# Patient Record
Sex: Male | Born: 1975 | Race: White | Hispanic: No | Marital: Married | Smoking: Never smoker
Health system: Southern US, Community
[De-identification: ages and names within clinical notes are randomized; demographics above are authoritative.]

## PROBLEM LIST (undated history)

## (undated) ENCOUNTER — Emergency Department (HOSPITAL_COMMUNITY): Admission: EM | Source: Home / Self Care

## (undated) DIAGNOSIS — N529 Male erectile dysfunction, unspecified: Secondary | ICD-10-CM

## (undated) DIAGNOSIS — G4730 Sleep apnea, unspecified: Secondary | ICD-10-CM

## (undated) DIAGNOSIS — E8881 Metabolic syndrome: Secondary | ICD-10-CM

## (undated) DIAGNOSIS — H9190 Unspecified hearing loss, unspecified ear: Secondary | ICD-10-CM

## (undated) DIAGNOSIS — N189 Chronic kidney disease, unspecified: Secondary | ICD-10-CM

## (undated) DIAGNOSIS — E669 Obesity, unspecified: Secondary | ICD-10-CM

## (undated) DIAGNOSIS — N200 Calculus of kidney: Secondary | ICD-10-CM

## (undated) DIAGNOSIS — K219 Gastro-esophageal reflux disease without esophagitis: Secondary | ICD-10-CM

## (undated) DIAGNOSIS — R112 Nausea with vomiting, unspecified: Secondary | ICD-10-CM

## (undated) DIAGNOSIS — E66811 Obesity, class 1: Secondary | ICD-10-CM

## (undated) DIAGNOSIS — Z9889 Other specified postprocedural states: Secondary | ICD-10-CM

## (undated) DIAGNOSIS — Z87442 Personal history of urinary calculi: Secondary | ICD-10-CM

## (undated) DIAGNOSIS — E119 Type 2 diabetes mellitus without complications: Secondary | ICD-10-CM

## (undated) DIAGNOSIS — R091 Pleurisy: Secondary | ICD-10-CM

## (undated) DIAGNOSIS — J9811 Atelectasis: Secondary | ICD-10-CM

## (undated) DIAGNOSIS — I10 Essential (primary) hypertension: Secondary | ICD-10-CM

## (undated) DIAGNOSIS — T7840XA Allergy, unspecified, initial encounter: Secondary | ICD-10-CM

## (undated) HISTORY — DX: Gastro-esophageal reflux disease without esophagitis: K21.9

## (undated) HISTORY — DX: Essential (primary) hypertension: I10

## (undated) HISTORY — DX: Unspecified hearing loss, unspecified ear: H91.90

## (undated) HISTORY — DX: Obesity, class 1: E66.811

## (undated) HISTORY — DX: Atelectasis: J98.11

## (undated) HISTORY — DX: Pleurisy: R09.1

## (undated) HISTORY — DX: Allergy, unspecified, initial encounter: T78.40XA

## (undated) HISTORY — DX: Metabolic syndrome: E88.810

## (undated) HISTORY — PX: VASECTOMY: SHX75

## (undated) HISTORY — DX: Type 2 diabetes mellitus without complications: E11.9

## (undated) HISTORY — DX: Morbid (severe) obesity due to excess calories: E66.01

## (undated) HISTORY — DX: Obesity, unspecified: E66.9

## (undated) HISTORY — DX: Male erectile dysfunction, unspecified: N52.9

## (undated) HISTORY — DX: Sleep apnea, unspecified: G47.30

## (undated) HISTORY — DX: Metabolic syndrome: E88.81

## (undated) HISTORY — PX: CYSTOSCOPY WITH URETEROSCOPY AND STENT PLACEMENT: SHX6377

---
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
## 2005-07-22 ENCOUNTER — Ambulatory Visit: Payer: Self-pay | Admitting: Internal Medicine

## 2007-08-11 ENCOUNTER — Ambulatory Visit: Payer: Self-pay | Admitting: Urology

## 2012-12-29 ENCOUNTER — Ambulatory Visit: Payer: Self-pay | Admitting: Family Medicine

## 2013-01-02 ENCOUNTER — Ambulatory Visit: Payer: Self-pay | Admitting: Family Medicine

## 2013-02-07 ENCOUNTER — Ambulatory Visit: Payer: Self-pay | Admitting: Family Medicine

## 2013-02-19 DIAGNOSIS — R310 Gross hematuria: Secondary | ICD-10-CM | POA: Insufficient documentation

## 2013-02-19 DIAGNOSIS — R1011 Right upper quadrant pain: Secondary | ICD-10-CM | POA: Insufficient documentation

## 2013-02-19 DIAGNOSIS — N200 Calculus of kidney: Secondary | ICD-10-CM | POA: Insufficient documentation

## 2013-02-19 DIAGNOSIS — Z87442 Personal history of urinary calculi: Secondary | ICD-10-CM | POA: Insufficient documentation

## 2013-02-19 HISTORY — DX: Calculus of kidney: N20.0

## 2013-02-19 HISTORY — DX: Personal history of urinary calculi: Z87.442

## 2013-02-27 ENCOUNTER — Ambulatory Visit: Payer: Self-pay | Admitting: Family Medicine

## 2013-03-29 ENCOUNTER — Ambulatory Visit: Payer: Self-pay | Admitting: Family Medicine

## 2013-05-28 ENCOUNTER — Ambulatory Visit: Payer: Self-pay | Admitting: Family Medicine

## 2013-05-29 ENCOUNTER — Ambulatory Visit: Payer: Self-pay | Admitting: Family Medicine

## 2013-06-23 ENCOUNTER — Emergency Department: Payer: Self-pay | Admitting: Emergency Medicine

## 2013-08-01 ENCOUNTER — Ambulatory Visit: Payer: Self-pay | Admitting: Family Medicine

## 2013-08-22 ENCOUNTER — Ambulatory Visit: Payer: Self-pay | Admitting: Urology

## 2013-08-22 DIAGNOSIS — R6882 Decreased libido: Secondary | ICD-10-CM | POA: Insufficient documentation

## 2013-08-22 DIAGNOSIS — N529 Male erectile dysfunction, unspecified: Secondary | ICD-10-CM | POA: Insufficient documentation

## 2013-09-21 ENCOUNTER — Ambulatory Visit: Payer: Self-pay | Admitting: Otolaryngology

## 2015-05-07 ENCOUNTER — Other Ambulatory Visit: Payer: Self-pay | Admitting: Family Medicine

## 2015-05-29 ENCOUNTER — Encounter: Payer: Self-pay | Admitting: Family Medicine

## 2015-06-16 ENCOUNTER — Other Ambulatory Visit: Payer: Self-pay | Admitting: Family Medicine

## 2015-06-16 ENCOUNTER — Ambulatory Visit (INDEPENDENT_AMBULATORY_CARE_PROVIDER_SITE_OTHER): Payer: BLUE CROSS/BLUE SHIELD | Admitting: Family Medicine

## 2015-06-16 ENCOUNTER — Encounter: Payer: Self-pay | Admitting: Family Medicine

## 2015-06-16 VITALS — BP 118/76 | HR 86 | Temp 97.6°F | Resp 18 | Ht 68.0 in | Wt 238.2 lb

## 2015-06-16 DIAGNOSIS — G4733 Obstructive sleep apnea (adult) (pediatric): Secondary | ICD-10-CM | POA: Diagnosis not present

## 2015-06-16 DIAGNOSIS — R7309 Other abnormal glucose: Secondary | ICD-10-CM

## 2015-06-16 DIAGNOSIS — N528 Other male erectile dysfunction: Secondary | ICD-10-CM

## 2015-06-16 DIAGNOSIS — N529 Male erectile dysfunction, unspecified: Secondary | ICD-10-CM

## 2015-06-16 DIAGNOSIS — Z13220 Encounter for screening for lipoid disorders: Secondary | ICD-10-CM | POA: Diagnosis not present

## 2015-06-16 DIAGNOSIS — K219 Gastro-esophageal reflux disease without esophagitis: Secondary | ICD-10-CM

## 2015-06-16 DIAGNOSIS — E669 Obesity, unspecified: Secondary | ICD-10-CM | POA: Insufficient documentation

## 2015-06-16 DIAGNOSIS — IMO0001 Reserved for inherently not codable concepts without codable children: Secondary | ICD-10-CM

## 2015-06-16 DIAGNOSIS — Z0000 Encounter for general adult medical examination without abnormal findings: Secondary | ICD-10-CM | POA: Diagnosis not present

## 2015-06-16 DIAGNOSIS — M25611 Stiffness of right shoulder, not elsewhere classified: Secondary | ICD-10-CM | POA: Insufficient documentation

## 2015-06-16 DIAGNOSIS — R7303 Prediabetes: Secondary | ICD-10-CM

## 2015-06-16 DIAGNOSIS — I10 Essential (primary) hypertension: Secondary | ICD-10-CM

## 2015-06-16 DIAGNOSIS — Z9989 Dependence on other enabling machines and devices: Secondary | ICD-10-CM

## 2015-06-16 NOTE — Progress Notes (Signed)
Name: Wayne Flowers   MRN: [id number]    DOB: 09-02-1976   Date:06/16/2015       Progress Note  Subjective  Chief Complaint  Chief Complaint  Patient presents with  . Annual Exam    HPI  Patient is here today for a Complete Male Physical Exam:  The patient has no acute concerns but would like to switch his PCP to me. Overall feels healthy with continued issues regarding low libido, obesity, sleep apnea, occasional right shoulder pain. Chronic issues such as GERD and HTN are well controled. Diet is well balanced. In general does exercise regularly and has recently increased efforts for weight loss goals. Sees dentist regularly and addresses vision concerns with ophthalmologist if applicable. In regards to sexual activity the patient is currently sexually active with married partner alone. Currently is not concerned about exposure to any STDs. Denies any family history of prostate or colon cancer.    Past Medical History  Diagnosis Date  . Obesity, Class I, BMI 30.0-34.9 (see actual BMI)   . Metabolic syndrome   . Sleep apnea   . Hypertension   . Allergy   . Atelectasis   . GERD (gastroesophageal reflux disease)   . ED (erectile dysfunction)   . Pleurisy     Past Surgical History  Procedure Laterality Date  . Vasectomy      Family History  Problem Relation Age of Onset  . Diabetes Mother   . Cancer Father     renal  . Diabetes Sister     History   Social History  . Marital Status: Married    Spouse Name: N/A  . Number of Children: N/A  . Years of Education: N/A   Occupational History  . Not on file.   Social History Main Topics  . Smoking status: Never Smoker   . Smokeless tobacco: Not on file  . Alcohol Use: 0.0 oz/week    0 Standard drinks or equivalent per week     Comment: occasional  . Drug Use: No  . Sexual Activity:    Partners: Female   Other Topics Concern  . Not on file   Social History Narrative     Current outpatient prescriptions:   .  fluticasone (FLONASE) 50 MCG/ACT nasal spray, USE 1 SPRAY IN EACH NOSTRIL EVERY DAY, Disp: , Rfl: 99 .  lisinopril (PRINIVIL,ZESTRIL) 10 MG tablet, Take 10 mg by mouth daily., Disp: , Rfl: 5 .  sildenafil (REVATIO) 20 MG tablet, Take 1 tablet by mouth daily., Disp: , Rfl:   Allergies  Allergen Reactions  . Omeprazole Other (See Comments)    Patient reports memory loss while using this product.   Depression screen [facility] 2/9 06/16/2015  Decreased Interest 0  Down, Depressed, Hopeless 0  PHQ - 2 Score 0     ROS  CONSTITUTIONAL: No significant weight changes, fever, chills, weakness or fatigue.  HEENT:  - Eyes: No visual changes.  - Ears: No auditory changes. No pain.  - Nose: No sneezing, congestion, runny nose. - Throat: No sore throat. No changes in swallowing. SKIN: No rash or itching.  CARDIOVASCULAR: No chest pain, chest pressure or chest discomfort. No palpitations or edema.  RESPIRATORY: No shortness of breath, cough or sputum.  GASTROINTESTINAL: No anorexia, nausea, vomiting. No changes in bowel habits. No abdominal pain or blood.  GENITOURINARY: No dysuria. No frequency. No discharge.  NEUROLOGICAL: No headache, dizziness, syncope, paralysis, ataxia, numbness or tingling in the extremities. No memory changes. No change  in bowel or bladder control.  MUSCULOSKELETAL: No joint pain. No muscle pain. HEMATOLOGIC: No anemia, bleeding or bruising.  LYMPHATICS: No enlarged lymph nodes.  PSYCHIATRIC: No change in mood. No change in sleep pattern.  ENDOCRINOLOGIC: No reports of sweating, cold or heat intolerance. No polyuria or polydipsia.   Objective  Filed Vitals:   06/16/15 0836  BP: 118/76  Pulse: 86  Temp: 97.6 F (36.4 C)  TempSrc: Oral  Resp: 18  Height: 5\' 8"  (1.727 m)  Weight: 238 lb 3.2 oz (108.047 kg)  SpO2: 96%   Body mass index is 36.23 kg/(m^2).   Physical Exam  Constitutional: Patient is obese and well-nourished. In no distress.  HEENT:  - Head:  Normocephalic and atraumatic.  - Ears: Bilateral TMs gray, no erythema or effusion - Nose: Nasal mucosa moist - Mouth/Throat: Oropharynx is clear and moist. No tonsillar hypertrophy or erythema. No post nasal drainage.  - Eyes: Conjunctivae clear, EOM movements normal. PERRLA. No scleral icterus.  Neck: Normal range of motion. Neck supple. No JVD present. No thyromegaly present.  Cardiovascular: Normal rate, regular rhythm and normal heart sounds.  No murmur heard.  Pulmonary/Chest: Effort normal and breath sounds normal. No respiratory distress. Abdominal: Soft. Bowel sounds are normal, no distension. There is no tenderness. no masses BREAST: Bilateral breast exam normal with no masses, skin changes or nipple discharge MALE GENITALIA: Bilateral testes descended with no masses, no penile lesions, no penile discharge. PROSTATE: Did not examine RECTAL: Did not examine Musculoskeletal: Normal range of motion bilateral UE and LE, no joint effusions. Peripheral vascular: Bilateral LE no edema. Neurological: CN II-XII grossly intact with no focal deficits. Alert and oriented to person, place, and time. Coordination, balance, strength, speech and gait are normal.  Skin: Skin is warm and dry. No rash noted. No erythema.  Psychiatric: Patient has a normal mood and affect. Behavior is normal in office today. Judgment and thought content normal in office today.    Assessment & Plan  1. Annual physical exam I have accepted [name] as my patient. We reviewed his medical history and current medical diagnoses and ongoing management.  2. Obesity, Class II, BMI 35-39.9, with comorbidity The patient has been counseled on their higher than normal BMI.  They have verbally expressed understanding their increased risk for other diseases.  In efforts to meet a better target BMI goal the patient has been counseled on lifestyle, diet and exercise modification tactics. Start with moderate intensity  aerobic exercise (walking, jogging, elliptical, swimming, group or individual sports, hiking) at least 39mins a day at least 4 days a week and increase intensity, duration, frequency as tolerated. Diet should include well balance fresh fruits and vegetables avoiding processed foods, carbohydrates and sugars. Drink at least 8oz 10 glasses a day avoiding sodas, sugary fruit drinks, sweetened tea. Check weight on a reliable scale daily and monitor weight loss progress daily. Consider investing in mobile phone apps that will help keep track of weight loss goals.   3. Pre-diabetes Lab work.  - Hemoglobin A1c  4. Obstructive sleep apnea treated with continuous positive airway pressure (CPAP) Continue with weight loss efforts.  - TSH  5. Hypertension goal BP (blood pressure) < 140/90 Well controled.  - CBC with Differential/Platelet - Comprehensive metabolic panel - TSH - Hemoglobin A1c  6. ED (erectile dysfunction) of organic origin May continue use of Sildenafil.  - TSH  7. Screening cholesterol level Lab work.  - Lipid panel  8. GERD without esophagitis  Continue lifestyle changes and Ranitidine prn.

## 2015-06-16 NOTE — Patient Instructions (Signed)
Fat and Cholesterol Control Diet Fat and cholesterol levels in your blood and organs are influenced by your diet. High levels of fat and cholesterol may lead to diseases of the heart, small and large blood vessels, gallbladder, liver, and pancreas. CONTROLLING FAT AND CHOLESTEROL WITH DIET Although exercise and lifestyle factors are important, your diet is key. That is because certain foods are known to raise cholesterol and others to lower it. The goal is to balance foods for their effect on cholesterol and more importantly, to replace saturated and trans fat with other types of fat, such as monounsaturated fat, polyunsaturated fat, and omega-3 fatty acids. On average, a person should consume no more than 15 to 17 g of saturated fat daily. Saturated and trans fats are considered "bad" fats, and they will raise LDL cholesterol. Saturated fats are primarily found in animal products such as meats, butter, and cream. However, that does not mean you need to give up all your favorite foods. Today, there are good tasting, low-fat, low-cholesterol substitutes for most of the things you like to eat. Choose low-fat or nonfat alternatives. Choose round or loin cuts of red meat. These types of cuts are lowest in fat and cholesterol. Chicken (without the skin), fish, veal, and ground turkey breast are great choices. Eliminate fatty meats, such as hot dogs and salami. Even shellfish have little or no saturated fat. Have a 3 oz (85 g) portion when you eat lean meat, poultry, or fish. Trans fats are also called "partially hydrogenated oils." They are oils that have been scientifically manipulated so that they are solid at room temperature resulting in a longer shelf life and improved taste and texture of foods in which they are added. Trans fats are found in stick margarine, some tub margarines, cookies, crackers, and baked goods.  When baking and cooking, oils are a great substitute for butter. The monounsaturated oils are  especially beneficial since it is believed they lower LDL and raise HDL. The oils you should avoid entirely are saturated tropical oils, such as coconut and palm.  Remember to eat a lot from food groups that are naturally free of saturated and trans fat, including fish, fruit, vegetables, beans, grains (barley, rice, couscous, bulgur wheat), and pasta (without cream sauces).  IDENTIFYING FOODS THAT LOWER FAT AND CHOLESTEROL  Soluble fiber may lower your cholesterol. This type of fiber is found in fruits such as apples, vegetables such as broccoli, potatoes, and carrots, legumes such as beans, peas, and lentils, and grains such as barley. Foods fortified with plant sterols (phytosterol) may also lower cholesterol. You should eat at least 2 g per day of these foods for a cholesterol lowering effect.  Read package labels to identify low-saturated fats, trans fat free, and low-fat foods at the supermarket. Select cheeses that have only 2 to 3 g saturated fat per ounce. Use a heart-healthy tub margarine that is free of trans fats or partially hydrogenated oil. When buying baked goods (cookies, crackers), avoid partially hydrogenated oils. Breads and muffins should be made from whole grains (whole-wheat or whole oat flour, instead of "flour" or "enriched flour"). Buy non-creamy canned soups with reduced salt and no added fats.  FOOD PREPARATION TECHNIQUES  Never deep-fry. If you must fry, either stir-fry, which uses very little fat, or use non-stick cooking sprays. When possible, broil, bake, or roast meats, and steam vegetables. Instead of putting butter or margarine on vegetables, use lemon and herbs, applesauce, and cinnamon (for squash and sweet potatoes). Use nonfat   yogurt, salsa, and low-fat dressings for salads.  LOW-SATURATED FAT / LOW-FAT FOOD SUBSTITUTES Meats / Saturated Fat (g)  Avoid: Steak, marbled (3 oz/85 g) / 11 g  Choose: Steak, lean (3 oz/85 g) / 4 g  Avoid: Hamburger (3 oz/85 g) / 7  g  Choose: Hamburger, lean (3 oz/85 g) / 5 g  Avoid: Ham (3 oz/85 g) / 6 g  Choose: Ham, lean cut (3 oz/85 g) / 2.4 g  Avoid: Chicken, with skin, dark meat (3 oz/85 g) / 4 g  Choose: Chicken, skin removed, dark meat (3 oz/85 g) / 2 g  Avoid: Chicken, with skin, light meat (3 oz/85 g) / 2.5 g  Choose: Chicken, skin removed, light meat (3 oz/85 g) / 1 g Dairy / Saturated Fat (g)  Avoid: Whole milk (1 cup) / 5 g  Choose: Low-fat milk, 2% (1 cup) / 3 g  Choose: Low-fat milk, 1% (1 cup) / 1.5 g  Choose: Skim milk (1 cup) / 0.3 g  Avoid: Hard cheese (1 oz/28 g) / 6 g  Choose: Skim milk cheese (1 oz/28 g) / 2 to 3 g  Avoid: Cottage cheese, 4% fat (1 cup) / 6.5 g  Choose: Low-fat cottage cheese, 1% fat (1 cup) / 1.5 g  Avoid: Ice cream (1 cup) / 9 g  Choose: Sherbet (1 cup) / 2.5 g  Choose: Nonfat frozen yogurt (1 cup) / 0.3 g  Choose: Frozen fruit bar / trace  Avoid: Whipped cream (1 tbs) / 3.5 g  Choose: Nondairy whipped topping (1 tbs) / 1 g Condiments / Saturated Fat (g)  Avoid: Mayonnaise (1 tbs) / 2 g  Choose: Low-fat mayonnaise (1 tbs) / 1 g  Avoid: Butter (1 tbs) / 7 g  Choose: Extra light margarine (1 tbs) / 1 g  Avoid: Coconut oil (1 tbs) / 11.8 g  Choose: Olive oil (1 tbs) / 1.8 g  Choose: Corn oil (1 tbs) / 1.7 g  Choose: Safflower oil (1 tbs) / 1.2 g  Choose: Sunflower oil (1 tbs) / 1.4 g  Choose: Soybean oil (1 tbs) / 2.4 g  Choose: Canola oil (1 tbs) / 1 g Document Released: 11/15/2005 Document Revised: 03/12/2013 Document Reviewed: 02/13/2014 Exit[employer] Patient Information 2015 [employer]. This information is not intended to replace advice given to you by your health care provider. Make sure you discuss any questions you have with your health care provider.  

## 2015-06-17 LAB — COMPREHENSIVE METABOLIC PANEL
ALT: 35 IU/L (ref 0–44)
AST: 21 IU/L (ref 0–40)
Albumin/Globulin Ratio: 2 (ref 1.1–2.5)
Albumin: 4.5 g/dL (ref 3.5–5.5)
Alkaline Phosphatase: 59 IU/L (ref 39–117)
BUN/Creatinine Ratio: 18 (ref 9–20)
BUN: 17 mg/dL (ref 6–24)
Bilirubin Total: 0.3 mg/dL (ref 0.0–1.2)
CO2: 25 mmol/L (ref 18–29)
Calcium: 9.4 mg/dL (ref 8.7–10.2)
Chloride: 100 mmol/L (ref 97–108)
Creatinine, Ser: 0.97 mg/dL (ref 0.76–1.27)
GFR calc Af Amer: 112 mL/min/{1.73_m2} (ref >59)
GFR calc non Af Amer: 97 mL/min/{1.73_m2} (ref >59)
Globulin, Total: 2.3 g/dL (ref 1.5–4.5)
Glucose: 115 mg/dL — ABNORMAL HIGH (ref 65–99)
Potassium: 5 mmol/L (ref 3.5–5.2)
Sodium: 139 mmol/L (ref 134–144)
Total Protein: 6.8 g/dL (ref 6.0–8.5)

## 2015-06-17 LAB — CBC WITH DIFFERENTIAL/PLATELET
Basophils Absolute: 0.1 10*3/uL (ref 0.0–0.2)
Basos: 1 %
EOS (ABSOLUTE): 0.2 10*3/uL (ref 0.0–0.4)
Eos: 2 %
Hematocrit: 42.8 % (ref 37.5–51.0)
Hemoglobin: 14.7 g/dL (ref 12.6–17.7)
Immature Grans (Abs): 0 10*3/uL (ref 0.0–0.1)
Immature Granulocytes: 0 %
Lymphocytes Absolute: 2.5 10*3/uL (ref 0.7–3.1)
Lymphs: 24 %
MCH: 29.8 pg (ref 26.6–33.0)
MCHC: 34.3 g/dL (ref 31.5–35.7)
MCV: 87 fL (ref 79–97)
Monocytes Absolute: 0.6 10*3/uL (ref 0.1–0.9)
Monocytes: 6 %
Neutrophils Absolute: 6.8 10*3/uL (ref 1.4–7.0)
Neutrophils: 67 %
Platelets: 296 10*3/uL (ref 150–379)
RBC: 4.94 x10E6/uL (ref 4.14–5.80)
RDW: 13.6 % (ref 12.3–15.4)
WBC: 10.3 10*3/uL (ref 3.4–10.8)

## 2015-06-17 LAB — HEMOGLOBIN A1C
Est. average glucose Bld gHb Est-mCnc: 134 mg/dL
Hgb A1c MFr Bld: 6.3 % — ABNORMAL HIGH (ref 4.8–5.6)

## 2015-06-17 LAB — LIPID PANEL
Chol/HDL Ratio: 4.3 ratio units (ref 0.0–5.0)
Cholesterol, Total: 200 mg/dL — ABNORMAL HIGH (ref 100–199)
HDL: 47 mg/dL (ref >39)
LDL Calculated: 121 mg/dL — ABNORMAL HIGH (ref 0–99)
Triglycerides: 162 mg/dL — ABNORMAL HIGH (ref 0–149)
VLDL Cholesterol Cal: 32 mg/dL (ref 5–40)

## 2015-06-17 LAB — TSH: TSH: 2.5 u[IU]/mL (ref 0.450–4.500)

## 2015-06-18 ENCOUNTER — Telehealth: Payer: Self-pay

## 2015-06-18 NOTE — Telephone Encounter (Signed)
Patient returned my call from earlier regarding his lab results. Patient was encouraged to modified his diet: decrease the amounts of fried fatty foods and incorporate more vegetables and whole grains along with 20mins of regular excericse. Patient was informed that she wanted to see him back in 6 months for a re-check and to do additional labs. Patient said ok.

## 2015-07-25 ENCOUNTER — Other Ambulatory Visit: Payer: Self-pay | Admitting: Emergency Medicine

## 2015-07-25 MED ORDER — LISINOPRIL 10 MG PO TABS: 10.0000 mg | ORAL_TABLET | Freq: Every day | ORAL | Status: DC

## 2015-08-25 ENCOUNTER — Telehealth: Payer: Self-pay | Admitting: Family Medicine

## 2015-08-25 NOTE — Telephone Encounter (Signed)
Called [facility] and spoke with Jerene Pitch to see how I can be of help to her. She stated that this patient was transferring his medication to their pharmacy and needing clarification on the dosage for his Viagra.

## 2015-08-25 NOTE — Telephone Encounter (Signed)
Wayne Flowers is reqeusting return call [phone]. They are needing clarification on Sildenafil

## 2015-09-01 ENCOUNTER — Emergency Department
Admission: EM | Admit: 2015-09-01 | Discharge: 2015-09-01 | Disposition: A | Discharge status: Home / Self Care | Payer: BLUE CROSS/BLUE SHIELD | Attending: Emergency Medicine | Admitting: Emergency Medicine

## 2015-09-01 ENCOUNTER — Encounter: Payer: Self-pay | Admitting: Emergency Medicine

## 2015-09-01 DIAGNOSIS — R109 Unspecified abdominal pain: Secondary | ICD-10-CM | POA: Diagnosis present

## 2015-09-01 DIAGNOSIS — I10 Essential (primary) hypertension: Secondary | ICD-10-CM | POA: Insufficient documentation

## 2015-09-01 DIAGNOSIS — Z79899 Other long term (current) drug therapy: Secondary | ICD-10-CM | POA: Diagnosis not present

## 2015-09-01 DIAGNOSIS — Z7951 Long term (current) use of inhaled steroids: Secondary | ICD-10-CM | POA: Insufficient documentation

## 2015-09-01 DIAGNOSIS — N23 Unspecified renal colic: Secondary | ICD-10-CM

## 2015-09-01 LAB — CBC WITH DIFFERENTIAL/PLATELET
Basophils Absolute: 0.2 10*3/uL — ABNORMAL HIGH (ref 0–0.1)
Basophils Relative: 1 %
Eosinophils Absolute: 0.2 10*3/uL (ref 0–0.7)
Eosinophils Relative: 1 %
HCT: 41.2 % (ref 40.0–52.0)
Hemoglobin: 13.7 g/dL (ref 13.0–18.0)
Lymphocytes Relative: 14 %
Lymphs Abs: 2.2 10*3/uL (ref 1.0–3.6)
MCH: 29.2 pg (ref 26.0–34.0)
MCHC: 33.3 g/dL (ref 32.0–36.0)
MCV: 87.7 fL (ref 80.0–100.0)
Monocytes Absolute: 1.1 10*3/uL — ABNORMAL HIGH (ref 0.2–1.0)
Monocytes Relative: 7 %
Neutro Abs: 12.2 10*3/uL — ABNORMAL HIGH (ref 1.4–6.5)
Neutrophils Relative %: 77 %
Platelets: 304 10*3/uL (ref 150–440)
RBC: 4.7 MIL/uL (ref 4.40–5.90)
RDW: 12.8 % (ref 11.5–14.5)
WBC: 15.8 10*3/uL — ABNORMAL HIGH (ref 3.8–10.6)

## 2015-09-01 LAB — URINALYSIS COMPLETE WITH MICROSCOPIC (ARMC ONLY)
Bacteria, UA: NONE SEEN
Bilirubin Urine: NEGATIVE
Glucose, UA: NEGATIVE mg/dL
Ketones, ur: NEGATIVE mg/dL
Leukocytes, UA: NEGATIVE
Nitrite: NEGATIVE
Protein, ur: 30 mg/dL — AB
Specific Gravity, Urine: 1.023 (ref 1.005–1.030)
Squamous Epithelial / LPF: NONE SEEN
pH: 5 (ref 5.0–8.0)

## 2015-09-01 LAB — BASIC METABOLIC PANEL
Anion gap: 10 (ref 5–15)
BUN: 17 mg/dL (ref 6–20)
CO2: 27 mmol/L (ref 22–32)
Calcium: 8.8 mg/dL — ABNORMAL LOW (ref 8.9–10.3)
Chloride: 99 mmol/L — ABNORMAL LOW (ref 101–111)
Creatinine, Ser: 1.28 mg/dL — ABNORMAL HIGH (ref 0.61–1.24)
GFR calc Af Amer: >60 mL/min (ref >60)
GFR calc non Af Amer: >60 mL/min (ref >60)
Glucose, Bld: 117 mg/dL — ABNORMAL HIGH (ref 65–99)
Potassium: 4.1 mmol/L (ref 3.5–5.1)
Sodium: 136 mmol/L (ref 135–145)

## 2015-09-01 MED ORDER — FENTANYL CITRATE (PF) 100 MCG/2ML IJ SOLN: 50.0000 ug | Freq: Once | INTRAMUSCULAR | Status: DC

## 2015-09-01 MED ORDER — FENTANYL CITRATE (PF) 100 MCG/2ML IJ SOLN
100.0000 ug | Freq: Once | INTRAMUSCULAR | Status: AC
  Administered 2015-09-01: 100 ug via INTRAVENOUS
  Filled 2015-09-01: qty 2

## 2015-09-01 MED ORDER — KETOROLAC TROMETHAMINE 30 MG/ML IJ SOLN
30.0000 mg | Freq: Once | INTRAMUSCULAR | Status: AC
  Administered 2015-09-01: 30 mg via INTRAVENOUS
  Filled 2015-09-01: qty 1

## 2015-09-01 MED ORDER — OXYCODONE-ACETAMINOPHEN 5-325 MG PO TABS: 1.0000 | ORAL_TABLET | ORAL | Status: DC | PRN

## 2015-09-01 MED ORDER — ONDANSETRON HCL 4 MG/2ML IJ SOLN
4.0000 mg | Freq: Once | INTRAMUSCULAR | Status: AC
  Administered 2015-09-01: 4 mg via INTRAVENOUS
  Filled 2015-09-01: qty 2

## 2015-09-01 NOTE — ED Notes (Signed)
SaO2 down to 86-91% RA post pain med.  Pt placed on O2 2L [place]. W/resulting SaO2 96%

## 2015-09-01 NOTE — Discharge Instructions (Signed)

## 2015-09-01 NOTE — ED Provider Notes (Signed)
[facility] Emergency Department Provider Note  ____________________________________________  Time seen: 417 pm  I have reviewed the triage vital signs and the nursing notes.   HISTORY  Chief Complaint Flank Pain     HPI Wayne Flowers is a 39 y.o. male  who presents with flank pain on the left. This is been present for approximately a week but worsened over the past day area it became particularly bad this afternoon and has been ongoing since approximately 3 PM. She reports this is similar to prior renal stones. His last renal stone was approximately one year ago. He is competent of this diagnosis.  He denies any fever. He denies any diarrhea or abdominal pain.    Past Medical History  Diagnosis Date  . Obesity, Class I, BMI 30.0-34.9 (see actual BMI)   . Metabolic syndrome   . Sleep apnea   . Hypertension   . Allergy   . Atelectasis   . GERD (gastroesophageal reflux disease)   . ED (erectile dysfunction)   . Pleurisy     Patient Active Problem List   Diagnosis Date Noted  . Obesity, Class II, BMI 35-39.9, with comorbidity ([place]) 06/16/2015  . Pre-diabetes 06/16/2015  . Obstructive sleep apnea treated with continuous positive airway pressure (CPAP) 06/16/2015  . Hypertension goal BP (blood pressure) < 140/90 06/16/2015  . Screening cholesterol level 06/16/2015  . Annual physical exam 06/16/2015  . GERD without esophagitis 06/16/2015  . Stiffness of right shoulder joint 06/16/2015  . 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 History of renal calculi 02/19/2013    Past Surgical History  Procedure Laterality Date  . Vasectomy      Current Outpatient Rx  Name  Route  Sig  Dispense  Refill  . fluticasone (FLONASE) 50 MCG/ACT nasal spray   Each Nare   Place 1 spray into both nostrils daily.         Marland Kitchen lisinopril (PRINIVIL,ZESTRIL) 10 MG tablet   Oral   Take 1 tablet (10 mg total) by mouth daily.   90 tablet   1   .  ranitidine (ZANTAC) 150 MG tablet   Oral   Take 150 mg by mouth 2 (two) times daily as needed for heartburn.         . sildenafil (REVATIO) 20 MG tablet   Oral   Take 60-100 mg by mouth daily as needed (for ED).          Marland Kitchen oxyCODONE-acetaminophen (ROXICET) 5-325 MG tablet   Oral   Take 1 tablet by mouth every 4 (four) hours as needed for severe pain.   15 tablet   0     Allergies Omeprazole  Family History  Problem Relation Age of Onset  . Diabetes Mother   . Cancer Father     renal  . Diabetes Sister     Social History Social History  Substance Use Topics  . Smoking status: Never Smoker   . Smokeless tobacco: None  . Alcohol Use: 0.0 oz/week    0 Standard drinks or equivalent per week     Comment: occasional    Review of Systems  Constitutional: Negative for fever. ENT: Negative for sore throat. Cardiovascular: Negative for chest pain. Respiratory: Negative for shortness of breath. Gastrointestinal: Negative for abdominal pain, vomiting and diarrhea. Genitourinary: Positive for left flank pain. Musculoskeletal: No myalgias or injuries. Skin: Negative for rash. Neurological: Negative for headaches   10-point ROS otherwise negative.  ____________________________________________   PHYSICAL  EXAM:  VITAL SIGNS: ED Triage Vitals  Enc Vitals Group     BP 09/01/15 2028 132/82 mmHg     Pulse Rate 09/01/15 2028 62     Resp 09/01/15 2028 16     Temp 09/01/15 2028 98.7 F (37.1 C)     Temp Source 09/01/15 2028 Oral     SpO2 09/01/15 2028 98 %     Weight --      Height --      Head Cir --      Peak Flow --      Pain Score 09/01/15 1959 10     Pain Loc --      Pain Edu? --      Excl. in [place]? --     Constitutional: Alert and oriented. The patient arrived to the emergency Department in significant distress, but now, after treatment with pain medication, he appears well and in no distress. ENT   Head: Normocephalic and atraumatic.   Nose: No  congestion/rhinnorhea. Cardiovascular: Normal rate, regular rhythm, no murmur noted Respiratory:  Normal respiratory effort, no tachypnea.    Breath sounds are clear and equal bilaterally.  Gastrointestinal: Soft and nontender. No distention.  Back: No muscle spasm, no tenderness, no CVA tenderness. Musculoskeletal: No deformity noted. Nontender with normal range of motion in all extremities.  No noted edema. Neurologic:  Normal speech and language. No gross focal neurologic deficits are appreciated.  Skin:  Skin is warm, dry. No rash noted. Psychiatric: Mood and affect are normal. Speech and behavior are normal.  ____________________________________________    LABS (pertinent positives/negatives)  Labs Reviewed  BASIC METABOLIC PANEL - Abnormal; Notable for the following:    Chloride 99 (*)    Glucose, Bld 117 (*)    Creatinine, Ser 1.28 (*)    Calcium 8.8 (*)    All other components within normal limits  URINALYSIS COMPLETEWITH MICROSCOPIC ([facility] ONLY) - Abnormal; Notable for the following:    Color, Urine YELLOW (*)    APPearance CLEAR (*)    Hgb urine dipstick 3+ (*)    Protein, ur 30 (*)    All other components within normal limits  CBC WITH DIFFERENTIAL/PLATELET - Abnormal; Notable for the following:    WBC 15.8 (*)    Neutro Abs 12.2 (*)    Monocytes Absolute 1.1 (*)    Basophils Absolute 0.2 (*)    All other components within normal limits   ____________________________________________   INITIAL IMPRESSION / ASSESSMENT AND PLAN / ED COURSE  Pertinent labs & imaging results that were available during my care of the patient were reviewed by me and considered in my medical decision making (see chart for details).  The patient was treated with fentanyl and Toradol. He reported fairly immediate relief with these medications. On my exam, the patient appears comfortable. The nurses have reported that he had a desaturation following treatment with this medication. On my  examination, I was able to turn the oxygen off. The patient maintained an oxygen saturation level of 98% on room air. He did not have any somnolence or apparent impairment from the medication he had received.  I discussed imaging options with this patient, including CT or ultrasound. The patient declines any imaging saying he is confident in the diagnosis. I am competent that he is right as well. We have a urinalysis pending to ensure that he does not have an infection. Meanwhile, he is comfortable. If the pain returns we will continue to use appropriate  pain medications.  ----------------------------------------- 10:11 PM on 09/01/2015 -----------------------------------------  Urine shows red blood cells too numerous to count. He does not have sign of infection in the urine. He does have white blood cell count of 15.8. His renal function looks good. At this time the patient is comfortable. We will discharge him home with a prescription for Percocet if needed and instructions to follow-up with urology.  ____________________________________________   FINAL CLINICAL IMPRESSION(S) / ED DIAGNOSES  Final diagnoses:  Renal colic on left side      Ahmed Prima, MD 09/01/15 2214

## 2015-09-01 NOTE — ED Notes (Signed)
Pt presents to ED with c/o left sided flank pain x4 days. Pt reports Hx Kidney stone.

## 2015-09-15 ENCOUNTER — Encounter: Payer: Self-pay | Admitting: *Deleted

## 2015-09-15 ENCOUNTER — Other Ambulatory Visit: Payer: BLUE CROSS/BLUE SHIELD

## 2015-09-15 NOTE — Patient Instructions (Signed)
  Your procedure is scheduled on: 09-17-15 Report to [facility] To find out your arrival time please call [phone] between 1PM - 3PM on 09-16-15  Remember: Instructions that are not followed completely may result in serious medical risk, up to and including death, or upon the discretion of your surgeon and anesthesiologist your surgery may need to be rescheduled.    _X___ 1. Do not eat food or drink liquids after midnight. No gum chewing or hard candies.     _X___ 2. No Alcohol for 24 hours before or after surgery.   ____ 3. Bring all medications with you on the day of surgery if instructed.    ____ 4. Notify your doctor if there is any change in your medical condition     (cold, fever, infections).     Do not wear jewelry, make-up, hairpins, clips or nail polish.  Do not wear lotions, powders, or perfumes. You may wear deodorant.  Do not shave 48 hours prior to surgery. Men may shave face and neck.  Do not bring valuables to the hospital.    [facility] is not responsible for any belongings or valuables.               Contacts, dentures or bridgework may not be worn into surgery.  Leave your suitcase in the car. After surgery it may be brought to your room.  For patients admitted to the hospital, discharge time is determined by your  treatment team.   Patients discharged the day of surgery will not be allowed to drive home.   Please read over the following fact sheets that you were given:     _X___ Take these medicines the morning of surgery with A SIP OF WATER:    1. LISINOPRIL  2. ZANTAC  3. TAKE AN EXTRA ZANTAC TONIGHT   4.  5.  6.  ____ Fleet Enema (as directed)   ____ Use CHG Soap as directed  ____ Use inhalers on the day of surgery  ____ Stop metformin 2 days prior to surgery    ____ Take 1/2 of usual insulin dose the night before surgery and none on the morning of surgery.   ____ Stop Coumadin/Plavix/aspirin-N/A  ____ Stop  Anti-inflammatories-NO NSAIDS OR ASA PRODUCTS-NUCYNTA OK TO CONTINUE   ____ Stop supplements until after surgery.    ____ Bring C-Pap to the hospital.

## 2015-09-16 ENCOUNTER — Encounter
Admission: RE | Disposition: A | Discharge status: Home / Self Care | Payer: Self-pay | Source: Ambulatory Visit | Attending: Urology

## 2015-09-16 ENCOUNTER — Ambulatory Visit: Payer: BLUE CROSS/BLUE SHIELD | Admitting: Anesthesiology

## 2015-09-16 ENCOUNTER — Encounter: Payer: Self-pay | Admitting: *Deleted

## 2015-09-16 ENCOUNTER — Ambulatory Visit
Admission: RE | Admit: 2015-09-16 | Discharge: 2015-09-16 | Disposition: A | Discharge status: Home / Self Care | Payer: BLUE CROSS/BLUE SHIELD | Source: Ambulatory Visit | Attending: Urology | Admitting: Urology

## 2015-09-16 DIAGNOSIS — I10 Essential (primary) hypertension: Secondary | ICD-10-CM | POA: Insufficient documentation

## 2015-09-16 DIAGNOSIS — J309 Allergic rhinitis, unspecified: Secondary | ICD-10-CM | POA: Insufficient documentation

## 2015-09-16 DIAGNOSIS — Z841 Family history of disorders of kidney and ureter: Secondary | ICD-10-CM | POA: Insufficient documentation

## 2015-09-16 DIAGNOSIS — N201 Calculus of ureter: Secondary | ICD-10-CM | POA: Diagnosis present

## 2015-09-16 DIAGNOSIS — N132 Hydronephrosis with renal and ureteral calculous obstruction: Secondary | ICD-10-CM | POA: Diagnosis not present

## 2015-09-16 DIAGNOSIS — R000 Tachycardia, unspecified: Secondary | ICD-10-CM | POA: Diagnosis not present

## 2015-09-16 DIAGNOSIS — Z8051 Family history of malignant neoplasm of kidney: Secondary | ICD-10-CM | POA: Insufficient documentation

## 2015-09-16 DIAGNOSIS — Z79899 Other long term (current) drug therapy: Secondary | ICD-10-CM | POA: Insufficient documentation

## 2015-09-16 DIAGNOSIS — K219 Gastro-esophageal reflux disease without esophagitis: Secondary | ICD-10-CM | POA: Diagnosis not present

## 2015-09-16 HISTORY — DX: Chronic kidney disease, unspecified: N18.9

## 2015-09-16 HISTORY — DX: Other specified postprocedural states: R11.2

## 2015-09-16 HISTORY — PX: CYSTOSCOPY W/ RETROGRADES: SHX1426

## 2015-09-16 HISTORY — PX: URETEROSCOPY WITH HOLMIUM LASER LITHOTRIPSY: SHX6645

## 2015-09-16 HISTORY — DX: Nausea with vomiting, unspecified: Z98.890

## 2015-09-16 LAB — GLUCOSE, CAPILLARY: Glucose-Capillary: 99 mg/dL (ref 65–99)

## 2015-09-16 SURGERY — URETEROSCOPY, WITH LITHOTRIPSY USING HOLMIUM LASER
Anesthesia: General | Laterality: Left | Wound class: Clean Contaminated

## 2015-09-16 MED ORDER — LIDOCAINE HCL 2 % EX GEL
CUTANEOUS | Status: AC
Start: 2015-09-16 — End: 2015-09-16
  Filled 2015-09-16: qty 10

## 2015-09-16 MED ORDER — LACTATED RINGERS IV SOLN
INTRAVENOUS | Status: DC
  Administered 2015-09-16 (×2): via INTRAVENOUS

## 2015-09-16 MED ORDER — FENTANYL CITRATE (PF) 100 MCG/2ML IJ SOLN
25.0000 ug | INTRAMUSCULAR | Status: DC | PRN
  Administered 2015-09-16 (×2): 25 ug via INTRAVENOUS

## 2015-09-16 MED ORDER — KETOROLAC TROMETHAMINE 60 MG/2ML IM SOLN
INTRAMUSCULAR | Status: AC
  Filled 2015-09-16: qty 2

## 2015-09-16 MED ORDER — NUCYNTA 50 MG PO TABS: 50.0000 mg | ORAL_TABLET | Freq: Four times a day (QID) | ORAL | Status: DC | PRN

## 2015-09-16 MED ORDER — LEVOFLOXACIN IN D5W 500 MG/100ML IV SOLN
INTRAVENOUS | Status: AC
  Administered 2015-09-16: 500 mg via INTRAVENOUS
  Filled 2015-09-16: qty 100

## 2015-09-16 MED ORDER — TAMSULOSIN HCL 0.4 MG PO CAPS: 0.4000 mg | ORAL_CAPSULE | Freq: Every day | ORAL | Status: DC

## 2015-09-16 MED ORDER — ONDANSETRON HCL 4 MG/2ML IJ SOLN: 4.0000 mg | Freq: Once | INTRAMUSCULAR | Status: DC | PRN

## 2015-09-16 MED ORDER — LEVOFLOXACIN 500 MG PO TABS: 500.0000 mg | ORAL_TABLET | Freq: Every day | ORAL | Status: DC

## 2015-09-16 MED ORDER — SODIUM CHLORIDE 0.9 % IR SOLN
Status: DC | PRN
  Administered 2015-09-16: 600 mL

## 2015-09-16 MED ORDER — DOCUSATE SODIUM 100 MG PO CAPS: 200.0000 mg | ORAL_CAPSULE | Freq: Two times a day (BID) | ORAL | Status: DC

## 2015-09-16 MED ORDER — BELLADONNA ALKALOIDS-OPIUM 16.2-60 MG RE SUPP
RECTAL | Status: DC | PRN
  Administered 2015-09-16: 1 via RECTAL

## 2015-09-16 MED ORDER — LIDOCAINE HCL (CARDIAC) 20 MG/ML IV SOLN
INTRAVENOUS | Status: DC | PRN
  Administered 2015-09-16: 60 mg via INTRAVENOUS

## 2015-09-16 MED ORDER — LIDOCAINE HCL 2 % EX GEL
CUTANEOUS | Status: DC | PRN
  Administered 2015-09-16: 1

## 2015-09-16 MED ORDER — MIDAZOLAM HCL 5 MG/5ML IJ SOLN
INTRAMUSCULAR | Status: DC | PRN
Start: 2015-09-16 — End: 2015-09-16
  Administered 2015-09-16: 2 mg via INTRAVENOUS

## 2015-09-16 MED ORDER — BELLADONNA ALKALOIDS-OPIUM 16.2-60 MG RE SUPP
RECTAL | Status: AC
  Filled 2015-09-16: qty 1

## 2015-09-16 MED ORDER — DEXAMETHASONE SODIUM PHOSPHATE 10 MG/ML IJ SOLN
INTRAMUSCULAR | Status: DC | PRN
  Administered 2015-09-16: 5 mg via INTRAVENOUS

## 2015-09-16 MED ORDER — LEVOFLOXACIN IN D5W 500 MG/100ML IV SOLN
500.0000 mg | Freq: Once | INTRAVENOUS | Status: AC
  Administered 2015-09-16: 500 mg via INTRAVENOUS

## 2015-09-16 MED ORDER — FENTANYL CITRATE (PF) 100 MCG/2ML IJ SOLN
INTRAMUSCULAR | Status: AC
  Administered 2015-09-16: 25 ug via INTRAVENOUS
  Filled 2015-09-16: qty 2

## 2015-09-16 MED ORDER — IOTHALAMATE MEGLUMINE 43 % IV SOLN
INTRAVENOUS | Status: DC | PRN
Start: 2015-09-16 — End: 2015-09-16
  Administered 2015-09-16: 5 mL

## 2015-09-16 MED ORDER — ONDANSETRON 8 MG PO TBDP: 8.0000 mg | ORAL_TABLET | Freq: Four times a day (QID) | ORAL | Status: DC | PRN

## 2015-09-16 MED ORDER — FAMOTIDINE 20 MG PO TABS
ORAL_TABLET | ORAL | Status: AC
  Administered 2015-09-16: 20 mg via ORAL
  Filled 2015-09-16: qty 1

## 2015-09-16 MED ORDER — PROPOFOL 10 MG/ML IV BOLUS
INTRAVENOUS | Status: DC | PRN
  Administered 2015-09-16: 200 mg via INTRAVENOUS

## 2015-09-16 MED ORDER — KETOROLAC TROMETHAMINE 30 MG/ML IJ SOLN
60.0000 mg | Freq: Once | INTRAMUSCULAR | Status: AC
  Administered 2015-09-16: 60 mg via INTRAMUSCULAR

## 2015-09-16 MED ORDER — FAMOTIDINE 20 MG PO TABS
20.0000 mg | ORAL_TABLET | Freq: Once | ORAL | Status: AC
Start: 2015-09-16 — End: 2015-09-16
  Administered 2015-09-16: 20 mg via ORAL

## 2015-09-16 MED ORDER — ONDANSETRON HCL 4 MG/2ML IJ SOLN
INTRAMUSCULAR | Status: DC | PRN
  Administered 2015-09-16: 4 mg via INTRAVENOUS

## 2015-09-16 MED ORDER — FENTANYL CITRATE (PF) 100 MCG/2ML IJ SOLN
INTRAMUSCULAR | Status: DC | PRN
  Administered 2015-09-16 (×4): 25 ug via INTRAVENOUS

## 2015-09-16 SURGICAL SUPPLY — 31 items
BAG DRAIN CYSTO-URO LG1000N (MISCELLANEOUS) ×2 IMPLANT
CATH FOL LX CONE TIP  8F (CATHETERS)
CATH FOL LX CONE TIP 8F (CATHETERS) IMPLANT
CATH URETL 5X70 OPEN END (CATHETERS) IMPLANT
CATH URETL OPEN END 6X70 (CATHETERS) ×2 IMPLANT
CNTNR SPEC 2.5X3XGRAD LEK (MISCELLANEOUS) ×1
CONRAY 43 FOR UROLOGY 50M (MISCELLANEOUS) ×2 IMPLANT
CONT SPEC 4OZ STER OR WHT (MISCELLANEOUS) ×1
CONTAINER SPEC 2.5X3XGRAD LEK (MISCELLANEOUS) ×1 IMPLANT
FEE TECHNICIAN ONLY PER HOUR (MISCELLANEOUS) ×2 IMPLANT
GLOVE BIO SURGEON STRL SZ7 (GLOVE) ×4 IMPLANT
GLOVE BIO SURGEON STRL SZ7.5 (GLOVE) ×2 IMPLANT
GOWN STRL REUS W/ TWL LRG LVL3 (GOWN DISPOSABLE) ×1 IMPLANT
GOWN STRL REUS W/ TWL XL LVL3 (GOWN DISPOSABLE) ×1 IMPLANT
GOWN STRL REUS W/TWL LRG LVL3 (GOWN DISPOSABLE) ×1
GOWN STRL REUS W/TWL XL LVL3 (GOWN DISPOSABLE) ×1
GOWN STRL REUS W/TWL XL LVL4 (GOWN DISPOSABLE) IMPLANT
GUIDEWIRE STR ZIPWIRE 035X150 (MISCELLANEOUS) ×2 IMPLANT
KIT RM TURNOVER CYSTO AR (KITS) ×2 IMPLANT
LASER HOLMIUM FIBER SU 272UM (MISCELLANEOUS) IMPLANT
LASER HOLMIUM STANDBY (MISCELLANEOUS) IMPLANT
LASER HOLMIUM SU 940UM (MISCELLANEOUS) IMPLANT
PACK CYSTO AR (MISCELLANEOUS) ×2 IMPLANT
PREP PVP WINGED SPONGE (MISCELLANEOUS) ×2 IMPLANT
SET CYSTO W/LG BORE CLAMP LF (SET/KITS/TRAYS/PACK) ×2 IMPLANT
SOL .9 NS 3000ML IRR  AL (IV SOLUTION) ×1
SOL .9 NS 3000ML IRR UROMATIC (IV SOLUTION) ×1 IMPLANT
SOL PREP PVP 2OZ (MISCELLANEOUS) ×2
SOLUTION PREP PVP 2OZ (MISCELLANEOUS) ×1 IMPLANT
SURGILUBE 2OZ TUBE FLIPTOP (MISCELLANEOUS) ×2 IMPLANT
WATER STERILE IRR 1000ML POUR (IV SOLUTION) ×2 IMPLANT

## 2015-09-16 NOTE — Anesthesia Preprocedure Evaluation (Addendum)
Anesthesia Evaluation  Patient identified by MRN, date of birth, ID band Patient awake    Reviewed: Allergy & Precautions, H&P , NPO status , Patient's Chart, lab work & pertinent test results, reviewed documented beta blocker date and time   History of Anesthesia Complications (+) PONV and history of anesthetic complications  Airway Mallampati: III  TM Distance: >3 FB Neck ROM: full    Dental no notable dental hx. (+) Implants   Pulmonary neg pulmonary ROS, sleep apnea ,    Pulmonary exam normal breath sounds clear to auscultation       Cardiovascular Exercise Tolerance: Good hypertension, negative cardio ROS   Rhythm:regular Rate:Normal     Neuro/Psych negative neurological ROS  negative psych ROS   GI/Hepatic negative GI ROS, Neg liver ROS, GERD  ,  Endo/Other  negative endocrine ROS  Renal/GU Renal diseasenegative Renal ROS  negative genitourinary   Musculoskeletal   Abdominal   Peds  Hematology negative hematology ROS (+)   Anesthesia Other Findings Past Medical History:   Obesity, Class I, BMI 30.0-34.9 (see actual BM*              Metabolic syndrome                                           Hypertension                                                 Allergy                                                      Atelectasis                                                  GERD (gastroesophageal reflux disease)                       ED (erectile dysfunction)                                    Pleurisy                                                     PONV (postoperative nausea and vomiting)                     Chronic kidney disease                                         Comment:KIDNEY STONE   Sleep apnea  Comment:HAS NOT USED CPAP IN 6 MONTHS   Reproductive/Obstetrics negative OB ROS                            Anesthesia  Physical Anesthesia Plan  ASA: III and emergent  Anesthesia Plan: General   Post-op Pain Management:    Induction:   Airway Management Planned:   Additional Equipment:   Intra-op Plan:   Post-operative Plan:   Informed Consent: I have reviewed the patients History and Physical, chart, labs and discussed the procedure including the risks, benefits and alternatives for the proposed anesthesia with the patient or authorized representative who has indicated his/her understanding and acceptance.   Dental Advisory Given  Plan Discussed with: CRNA, Anesthesiologist and Surgeon  Anesthesia Plan Comments:        Anesthesia Quick Evaluation

## 2015-09-16 NOTE — Discharge Instructions (Addendum)
Kidney Stones Kidney stones (urolithiasis) are deposits that form inside your kidneys. The intense pain is caused by the stone moving through the urinary tract. When the stone moves, the ureter goes into spasm around the stone. The stone is usually passed in the urine.  CAUSES   A disorder that makes certain neck glands produce too much parathyroid hormone (primary hyperparathyroidism).  A buildup of uric acid crystals, similar to gout in your joints.  Narrowing (stricture) of the ureter.  A kidney obstruction present at birth (congenital obstruction).  Previous surgery on the kidney or ureters.  Numerous kidney infections. SYMPTOMS   Feeling sick to your stomach (nauseous).  Throwing up (vomiting).  Blood in the urine (hematuria).  Pain that usually spreads (radiates) to the groin.  Frequency or urgency of urination. DIAGNOSIS   Taking a history and physical exam.  Blood or urine tests.  CT scan.  Occasionally, an examination of the inside of the urinary bladder (cystoscopy) is performed. TREATMENT   Observation.  Increasing your fluid intake.  Extracorporeal shock wave lithotripsy--This is a noninvasive procedure that uses shock waves to break up kidney stones.  Surgery may be needed if you have severe pain or persistent obstruction. There are various surgical procedures. Most of the procedures are performed with the use of small instruments. Only small incisions are needed to accommodate these instruments, so recovery time is minimized. The size, location, and chemical composition are all important variables that will determine the proper choice of action for you. Talk to your health care provider to better understand your situation so that you will minimize the risk of injury to yourself and your kidney.  HOME CARE INSTRUCTIONS   Drink enough water and fluids to keep your urine clear or pale yellow. This will help you to pass the stone or stone fragments.  Strain  all urine through the provided strainer. Keep all particulate matter and stones for your health care provider to see. The stone causing the pain may be as small as a grain of salt. It is very important to use the strainer each and every time you pass your urine. The collection of your stone will allow your health care provider to analyze it and verify that a stone has actually passed. The stone analysis will often identify what you can do to reduce the incidence of recurrences.  Only take over-the-counter or prescription medicines for pain, discomfort, or fever as directed by your health care provider.  Keep all follow-up visits as told by your health care provider. This is important.  Get follow-up X-rays if required. The absence of pain does not always mean that the stone has passed. It may have only stopped moving. If the urine remains completely obstructed, it can cause loss of kidney function or even complete destruction of the kidney. It is your responsibility to make sure X-rays and follow-ups are completed. Ultrasounds of the kidney can show blockages and the status of the kidney. Ultrasounds are not associated with any radiation and can be performed easily in a matter of minutes.  Make changes to your daily diet as told by your health care provider. You may be told to:  Limit the amount of salt that you eat.  Eat 5 or more servings of fruits and vegetables each day.  Limit the amount of meat, poultry, fish, and eggs that you eat.  Collect a 24-hour urine sample as told by your health care provider.You may need to collect another urine sample every 6-12  months. SEEK MEDICAL CARE IF:  You experience pain that is progressive and unresponsive to any pain medicine you have been prescribed. SEEK IMMEDIATE MEDICAL CARE IF:   Pain cannot be controlled with the prescribed medicine.  You have a fever or shaking chills.  The severity or intensity of pain increases over 18 hours and is not  relieved by pain medicine.  You develop a new onset of abdominal pain.  You feel faint or pass out.  You are unable to urinate.   This information is not intended to replace advice given to you by your health care provider. Make sure you discuss any questions you have with your health care provider.   Document Released: 11/15/2005 Document Revised: 08/06/2015 Document Reviewed: 04/18/2013 Elsevier Interactive Patient Education [employer].  Ureteroscopy Ureteroscopy is a surgical procedure to check for and treat a problem inside part of your urinary tract. You may need this procedure if you have frequent urinary tract infections, blood in your urine, or a stone in one of the tubes that carries urine from your kidneys to your bladder (ureter). You may also have this procedure if your health care provider wants to check for an abnormal growth in your ureter. To do this, your surgeon passes a thin, flexible telescope (ureteroscope) into one or both ureters. LET [facility] CARE PROVIDER KNOW ABOUT:   Any allergies you have.  All medicines you are taking, including vitamins, herbs, eye drops, creams, and over-the-counter medicines.  Previous problems you or members of your family have had with the use of anesthetics.  Any blood disorders you have.  Previous surgeries you have had.  Medical conditions you have. RISKS AND COMPLICATIONS  Generally, this is a safe procedure. However, as with any procedure, problems can occur. Possible problems include:   Bleeding.  Pain.  Infection.  Scarring that narrows the ureter (stricture).  Creating a hole in the ureter (perforation). BEFORE THE PROCEDURE   You may be given a medicine to make you sleep during ureteroscopy (general anesthetic).  Do not eat or drink anything for 6-8 hours before the procedure.  You may also need to have a urine test before the procedure to check for any sign of infection.  You may be given an  antibiotic medicine by injection or through an IV tube before the procedure to prevent infection.  Arrange for someone to take you home after the procedure. PROCEDURE  You may be given one of the following medicines for this procedure:  A medicine that makes you go to sleep (general anesthetic).  A medicine injected into your spine that numbs your body below the waist (spinal anesthetic). This procedure is usually done as outpatient surgery and usually takes about 30 minutes. During the procedure:  The opening from which you urinate (urethra) will be cleaned with a germ-killing solution.  The ureteroscope will be passed through your urethra into your bladder.  A saltwater solution will flow through the ureteroscope to fill your bladder. This will help the surgeon see the openings of your ureters clearly.  The ureteroscope will then be passed into your ureter.  If a growth is found, your surgeon may take a piece of the growth (biopsy) to examine under a microscope.  If a stone is found, your surgeon may remove the stone through the ureteroscope or break up the stone using a laser, shock waves, or electrical energy.  In some cases, the surgeon may put in a tube that keeps your ureter open (  ureteral stent).  When the procedure is over, the surgeon will remove the scope. Then your bladder will be emptied and you will go to the recovery area. AFTER THE PROCEDURE  After ureteroscopy, you will remain in the recovery area until the anesthesia wears off and your surgeon says you can go home.    This information is not intended to replace advice given to you by your health care provider. Make sure you discuss any questions you have with your health care provider.   Document Released: 11/20/2013 Document Reviewed: 11/20/2013 Elsevier Interactive Patient Education [employer]. Ureteroscopy, Care After Refer to this sheet in the next few weeks. These instructions provide you with  information on caring for yourself after your procedure. Your health care provider may also give you more specific instructions. Your treatment has been planned according to current medical practices, but problems sometimes occur. Call your health care provider if you have any problems or questions after your procedure.  WHAT TO EXPECT AFTER THE PROCEDURE  After your procedure, it is typical to have the following:   A burning sensation when you urinate.  Blood in your urine. HOME CARE INSTRUCTIONS   Only take medicines as directed by your health care provider. Do not take any over-the-counter pain medication unless your health care provider says it is okay.  Take a warm bath or hold a warm washcloth over your groin to relieve burning.  Drink enough fluids to keep your urine clear or pale yellow.  Drink two 8-ounce glasses of water every hour for the first 2 hours after you get home.  Continue to drink water often at home.  You can eat what you usually do.  Ask your surgeon when you can do your usual activities.  If you had a tube placed to keep urine flowing (ureteral stent), ask your health care provider when you need to return to have it removed.  Keep all follow-up appointments. SEEK MEDICAL CARE IF:   You have chills or fever.  You have burning pain for longer than 24 hours after the procedure.  You have blood in your urine for longer than 24 hours after the procedure. SEEK IMMEDIATE MEDICAL CARE IF:   You have large amounts of blood or clots in your urine.  You have very bad pain.  You have chest pain or trouble breathing.   This information is not intended to replace advice given to you by your health care provider. Make sure you discuss any questions you have with your health care provider.   Document Released: 11/20/2013 Document Reviewed: 11/20/2013 Elsevier Interactive Patient Education 2016 [place]   1) The drugs that you were given will stay in your system until tomorrow so for the next 24 hours you should not:  A) Drive an automobile B) Make any legal decisions C) Drink any alcoholic beverage   2) You may resume regular meals tomorrow.  Today it is better to start with liquids and gradually work up to solid foods.  You may eat anything you prefer, but it is better to start with liquids, then soup and crackers, and gradually work up to solid foods.   3) Please notify your doctor immediately if you have any unusual bleeding, trouble breathing, redness and pain at the surgery site, drainage, fever, or pain not relieved by medication. 4)   5) Your post-operative visit with Dr.  is: Date:                        Time:    Please call to schedule your post-operative visit.  6) Additional Instructions: 7)

## 2015-09-16 NOTE — H&P (Signed)
Date of Initial H&P: 09/03/15  History reviewed, patient examined, no change in status, stable for surgery.

## 2015-09-16 NOTE — Anesthesia Procedure Notes (Signed)
Procedure Name: LMA Insertion Date/Time: 09/16/2015 3:56 PM Performed by: Dionne Bucy Pre-anesthesia Checklist: Patient identified, Patient being monitored, Timeout performed, Emergency Drugs available and Suction available Patient Re-evaluated:Patient Re-evaluated prior to inductionOxygen Delivery Method: Circle system utilized Preoxygenation: Pre-oxygenation with 100% oxygen Intubation Type: IV induction Ventilation: Mask ventilation without difficulty LMA: LMA inserted LMA Size: 5.0 Tube type: Oral Number of attempts: 1 Placement Confirmation: positive ETCO2 and breath sounds checked- equal and bilateral Tube secured with: Tape Dental Injury: Teeth and Oropharynx as per pre-operative assessment

## 2015-09-16 NOTE — Transfer of Care (Signed)
Immediate Anesthesia Transfer of Care Note  Patient: Wayne Flowers  Procedure(s) Performed: Procedure(s): URETEROSCOPY WITH HOLMIUM LASER LITHOTRIPSY (Left) CYSTOSCOPY WITH RETROGRADE PYELOGRAM (Left)  Patient Location: PACU  Anesthesia Type:General  Level of Consciousness: sedated  Airway & Oxygen Therapy: Patient Spontanous Breathing and Patient connected to face mask oxygen  Post-op Assessment: Report given to RN and Post -op Vital signs reviewed and stable  Post vital signs: Reviewed and stable  Last Vitals:  Filed Vitals:   09/16/15 1645  BP: 119/70  Pulse: 93  Temp: 36.7 C  Resp: 18    Complications: No apparent anesthesia complications

## 2015-09-16 NOTE — OR Nursing (Signed)
PT. C/O PAIN , HOSPITAL DOES  NOT CARRY NUCYNTA, DR. WOLFF CALLED TORADOL ORDERED SEE MAR

## 2015-09-16 NOTE — Op Note (Signed)
Preoperative diagnosis: Left ureterolithiasis Postoperative diagnosis: Same  Procedure: 1. Left ureteroscopic ureterolithotomy with holmium laser lithotripsy                      2. Left retrograde pyelogram                      3. Fluoroscopy   Surgeon: Otelia Limes. Yves Dill MD, FACS Anesthesia: Gen.  Indications:See the history and physical. After informed consent the above procedure(s) were requested     Technique and findings: After adequate general anesthesia had been obtained the patient was placed into dorsal lithotomy position and the perineum was prepped and draped in the usual fashion. Fluoroscopy revealed a 4 mm stone in the distal left ureter at the level of the SI joint. At this point cystoscope was coupled the camera and visually advanced into the bladder. Bladder was thoroughly inspected. The bladder lesions were identified. Both ureteral orifices were identified and had clear reflux. A 6 French open-ended ureteral catheter placed in the left orifice and retrograde pyelograms were performed. The pyelogram confirmed presence of a distal stone with proximal hydronephrosis. No other abnormalities were identified. At this point a 0.035 Glidewire was advanced into the left ureter and passed up the ureter under fluoroscopic guidance. The intramural ureter was dilated to 7 mm with a balloon dilating catheter. The balloon was then deflated and removed taking care leave the guidewire in position. The mini rigid ureteroscope was then advanced into the ureter up to level stone. The 3 and 65  holmium laser fiber was passed through the scope and the stone was disintegrated. The ureteroscope and guidewire was then removed. 10 cc of viscous Xylocaine was instilled within the urethra and the bladder. A B and O suppository was placed. The procedure was then terminated and the patient transferred to the recovery room in stable condition.

## 2015-09-17 ENCOUNTER — Encounter: Payer: Self-pay | Admitting: Urology

## 2015-09-17 NOTE — Anesthesia Postprocedure Evaluation (Signed)
  Anesthesia Post-op Note  Patient: Wayne Flowers  Procedure(s) Performed: Procedure(s): URETEROSCOPY WITH HOLMIUM LASER LITHOTRIPSY (Left) CYSTOSCOPY WITH RETROGRADE PYELOGRAM (Left)  Anesthesia type:General  Patient location: PACU  Post pain: Pain level controlled  Post assessment: Post-op Vital signs reviewed, Patient's Cardiovascular Status Stable, Respiratory Function Stable, Patent Airway and No signs of Nausea or vomiting  Post vital signs: Reviewed and stable  Last Vitals:  Filed Vitals:   09/16/15 1827  BP: 138/85  Pulse: 62  Temp:   Resp: 20    Level of consciousness: awake, alert  and patient cooperative  Complications: No apparent anesthesia complications

## 2015-11-19 ENCOUNTER — Other Ambulatory Visit: Payer: Self-pay | Admitting: Family Medicine

## 2015-12-18 ENCOUNTER — Ambulatory Visit: Payer: BLUE CROSS/BLUE SHIELD | Admitting: Family Medicine

## 2015-12-22 ENCOUNTER — Ambulatory Visit (INDEPENDENT_AMBULATORY_CARE_PROVIDER_SITE_OTHER): Payer: 59 | Admitting: Family Medicine

## 2015-12-22 ENCOUNTER — Encounter: Payer: Self-pay | Admitting: Family Medicine

## 2015-12-22 ENCOUNTER — Encounter (INDEPENDENT_AMBULATORY_CARE_PROVIDER_SITE_OTHER): Payer: Self-pay

## 2015-12-22 VITALS — BP 118/82 | HR 70 | Temp 98.2°F | Resp 14 | Ht 69.0 in | Wt 240.7 lb

## 2015-12-22 DIAGNOSIS — Z23 Encounter for immunization: Secondary | ICD-10-CM

## 2015-12-22 DIAGNOSIS — J45909 Unspecified asthma, uncomplicated: Secondary | ICD-10-CM

## 2015-12-22 DIAGNOSIS — R0982 Postnasal drip: Secondary | ICD-10-CM | POA: Diagnosis not present

## 2015-12-22 DIAGNOSIS — I10 Essential (primary) hypertension: Secondary | ICD-10-CM

## 2015-12-22 DIAGNOSIS — K219 Gastro-esophageal reflux disease without esophagitis: Secondary | ICD-10-CM

## 2015-12-22 DIAGNOSIS — J309 Allergic rhinitis, unspecified: Secondary | ICD-10-CM

## 2015-12-22 MED ORDER — FLUTICASONE PROPIONATE 50 MCG/ACT NA SUSP: 2.0000 | Freq: Every day | NASAL | Status: DC

## 2015-12-22 MED ORDER — DEXILANT 30 MG PO CPDR: 30.0000 mg | DELAYED_RELEASE_CAPSULE | Freq: Every day | ORAL | Status: DC

## 2015-12-22 NOTE — Progress Notes (Signed)
Name: Wayne Flowers   MRN: [id number]    DOB: [birth year]   Date:12/22/2015       Progress Note  Subjective  Chief Complaint  Chief Complaint  Patient presents with  . Medication Refill    6 month f/u  . Hypertension  . Gastroesophageal Reflux    Patienjt states zantaz is not helping needs something stronger.    HPI  Wayne Flowers is a 40 year old male with chronic HTN and GERD. Here for routine f/u.   Hypertension: Patient here for follow-up of elevated blood pressure. He is exercising and is adherent to low salt diet.  Blood pressure is well controlled at home. Cardiac symptoms none. Patient denies chest pain, chest pressure/discomfort, dyspnea, exertional chest pressure/discomfort, fatigue, irregular heart beat, near-syncope and palpitations.  Cardiovascular risk factors: hypertension, male gender, obesity (BMI >= 30 kg/m2) and pre-diabetes. Use of agents associated with hypertension: NSAIDS. History of target organ damage: none.  GERD: Paitent complains of heartburn. This has been associated with cough.  He denies abdominal bloating, chest pain, choking on food, deep pressure at base of neck, difficulty swallowing, hoarseness, midespigastric pain, nausea, regurgitation of undigested food, unexpected weight loss and upper abdominal discomfort. Symptoms have been present for several years. He denies dysphagia.  He has not lost weight. He denies melena, hematochezia, hematemesis, and coffee ground emesis. Medical therapy in the past has included antacids, H2 antagonists and proton pump inhibitors. Zantac 150 mg po bid is not helping. Omeprazole previously caused memory changes so it was discontinued. Not necessarily an allergy so I am removing it from his allergy list.   Allergies well controled. Needs refill of Flonase.    Past Medical History  Diagnosis Date  . Obesity, Class I, BMI 30.0-34.9 (see actual BMI)   . Metabolic syndrome   . Hypertension   . Allergy   . Atelectasis    . GERD (gastroesophageal reflux disease)   . ED (erectile dysfunction)   . Pleurisy   . PONV (postoperative nausea and vomiting)   . Chronic kidney disease     KIDNEY STONE  . Sleep apnea     HAS NOT USED CPAP IN 6 MONTHS    Patient Active Problem List   Diagnosis Date Noted  . Obesity, Class II, BMI 35-39.9, with comorbidity ([place]) 06/16/2015  . Pre-diabetes 06/16/2015  . Obstructive sleep apnea treated with continuous positive airway pressure (CPAP) 06/16/2015  . Hypertension goal BP (blood pressure) < 140/90 06/16/2015  . Screening cholesterol level 06/16/2015  . Annual physical exam 06/16/2015  . GERD without esophagitis 06/16/2015  . Stiffness of right shoulder joint 06/16/2015  . 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 History of renal calculi 02/19/2013    Social History  Substance Use Topics  . Smoking status: Never Smoker   . Smokeless tobacco: Not on file  . Alcohol Use: No     Current outpatient prescriptions:  .  fluticasone (FLONASE) 50 MCG/ACT nasal spray, Place 1 spray into both nostrils daily., Disp: , Rfl:  .  lisinopril (PRINIVIL,ZESTRIL) 10 MG tablet, TAKE 1 TABLET BY MOUTH ONCE DAILY, Disp: 90 tablet, Rfl: 1 .  ranitidine (ZANTAC) 150 MG tablet, Take 150 mg by mouth 2 (two) times daily as needed for heartburn., Disp: , Rfl:  .  sildenafil (REVATIO) 20 MG tablet, Take 60-100 mg by mouth daily as needed (for ED). , Disp: , Rfl:   Past Surgical History  Procedure Laterality Date  . Vasectomy    .  Cystoscopy with ureteroscopy and stent placement    . Ureteroscopy with holmium laser lithotripsy Left 09/16/2015    Procedure: URETEROSCOPY WITH HOLMIUM LASER LITHOTRIPSY;  Surgeon: Royston Cowper, MD;  Location: [facility] ORS;  Service: Urology;  Laterality: Left;  . Cystoscopy w/ retrogrades Left 09/16/2015    Procedure: CYSTOSCOPY WITH RETROGRADE PYELOGRAM;  Surgeon: Royston Cowper, MD;  Location: [facility] ORS;  Service: Urology;  Laterality:  Left;    Family History  Problem Relation Age of Onset  . Diabetes Mother   . Cancer Father     renal  . Diabetes Sister     Allergies  Allergen Reactions  . Omeprazole Other (See Comments)    Reaction:  Memory loss      Review of Systems  CONSTITUTIONAL: No significant weight changes, fever, chills, weakness or fatigue.  HEENT:  - Eyes: No visual changes.  - Ears: No auditory changes. No pain.  - Nose: No sneezing, congestion, runny nose. - Throat: No sore throat. No changes in swallowing. SKIN: No rash or itching.  CARDIOVASCULAR: No chest pain, chest pressure or chest discomfort. No palpitations or edema.  RESPIRATORY: No shortness of breath, cough or sputum.  GASTROINTESTINAL: No anorexia, nausea, vomiting. No changes in bowel habits. No abdominal pain or blood.  NEUROLOGICAL: No headache, dizziness, syncope, paralysis, ataxia, numbness or tingling in the extremities. No memory changes. No change in bowel or bladder control.  MUSCULOSKELETAL: No joint pain. No muscle pain. HEMATOLOGIC: No anemia, bleeding or bruising.  LYMPHATICS: No enlarged lymph nodes.  PSYCHIATRIC: No change in mood. No change in sleep pattern.  ENDOCRINOLOGIC: No reports of sweating, cold or heat intolerance. No polyuria or polydipsia.     Objective  BP 118/82 mmHg  Pulse 70  Temp(Src) 98.2 F (36.8 C) (Oral)  Resp 14  Ht 5\' 9"  (1.753 m)  Wt 240 lb 11.2 oz (109.181 kg)  BMI 35.53 kg/m2  SpO2 96% Body mass index is 35.53 kg/(m^2).  Physical Exam  Constitutional: Patient is obese and well-nourished. In no distress.  HEENT:  - Head: Normocephalic and atraumatic.  - Ears: Bilateral TMs gray, no erythema or effusion - Nose: Nasal mucosa moist - Mouth/Throat: Oropharynx is clear and moist. No tonsillar hypertrophy or erythema. No post nasal drainage.  - Eyes: Conjunctivae clear, EOM movements normal. PERRLA. No scleral icterus.  Neck: Normal range of motion. Neck supple. No JVD present.  No thyromegaly present.  Cardiovascular: Normal rate, regular rhythm and normal heart sounds.  No murmur heard.  Pulmonary/Chest: Effort normal and breath sounds normal. No respiratory distress. Abdomen: Soft, non tender, non distended, normal bowel sounds in all four quadrants, no HSM. Musculoskeletal: Normal range of motion bilateral UE and LE, no joint effusions. Peripheral vascular: Bilateral LE no edema. Neurological: CN II-XII grossly intact with no focal deficits. Alert and oriented to person, place, and time. Coordination, balance, strength, speech and gait are normal.  Skin: Skin is warm and dry. No rash noted. No erythema.  Psychiatric: Patient has a normal mood and affect. Behavior is normal in office today. Judgment and thought content normal in office today.   Assessment & Plan  1. Hypertension goal BP (blood pressure) < 140/90 Well controled.   2. GERD without esophagitis Symptoms consist of cough and having to clear throat, may be more related to allergic rhinitis with post nasal drip. Instructed patient to use any reflux medication on an as needed basis. Trial of Dexilant. If not cost effective despite coupon card provided  he is willing to try Omeprazole again or Protonix.  - DEXILANT 30 MG capsule; Take 1 capsule (30 mg total) by mouth daily.  Dispense: 30 capsule; Refill: 5  3. Needs flu shot  - Flu Vaccine QUAD 36+ mos PF IM (Fluarix & Fluzone Quad PF)  4. Allergic rhinitis with postnasal drip Stable, refilled.  - fluticasone (FLONASE) 50 MCG/ACT nasal spray; Place 2 sprays into both nostrils daily.  Dispense: 16 g; Refill: 5

## 2015-12-23 ENCOUNTER — Telehealth: Payer: Self-pay | Admitting: Family Medicine

## 2015-12-23 DIAGNOSIS — K219 Gastro-esophageal reflux disease without esophagitis: Secondary | ICD-10-CM

## 2015-12-23 NOTE — Telephone Encounter (Signed)
Patient states that protonix is to expensive and is requesting omprazole 90 day supply to be sent to [facility]

## 2015-12-24 MED ORDER — OMEPRAZOLE 20 MG PO CPDR: 20.0000 mg | DELAYED_RELEASE_CAPSULE | Freq: Every day | ORAL | Status: DC

## 2015-12-24 NOTE — Telephone Encounter (Signed)
He means Dexilant is too expensive. I have sent new Rx for Omeprazole 20 mg.

## 2016-05-04 ENCOUNTER — Other Ambulatory Visit: Payer: Self-pay | Admitting: Family Medicine

## 2016-05-05 ENCOUNTER — Telehealth: Payer: Self-pay | Admitting: Family Medicine

## 2016-05-05 MED ORDER — SILDENAFIL CITRATE 20 MG PO TABS: 60.0000 mg | ORAL_TABLET | Freq: Every day | ORAL | Status: DC | PRN

## 2016-05-05 NOTE — Telephone Encounter (Signed)
Patient has upcoming appointment with you on 06-08-16. He is requesting a refill on Sildenafil. Please send to [facility]. This appointment was scheduled before the new rule

## 2016-05-05 NOTE — Telephone Encounter (Signed)
That's fine. Rx sent.

## 2016-05-07 NOTE — Telephone Encounter (Signed)
Patient informed. 

## 2016-05-28 ENCOUNTER — Other Ambulatory Visit: Payer: Self-pay | Admitting: Family Medicine

## 2016-05-28 DIAGNOSIS — E119 Type 2 diabetes mellitus without complications: Secondary | ICD-10-CM

## 2016-06-08 ENCOUNTER — Encounter: Payer: Self-pay | Admitting: Family Medicine

## 2016-06-08 ENCOUNTER — Encounter (INDEPENDENT_AMBULATORY_CARE_PROVIDER_SITE_OTHER): Payer: Self-pay

## 2016-06-08 ENCOUNTER — Ambulatory Visit (INDEPENDENT_AMBULATORY_CARE_PROVIDER_SITE_OTHER): Payer: 59 | Admitting: Family Medicine

## 2016-06-08 VITALS — BP 122/86 | HR 94 | Temp 98.5°F | Resp 16 | Wt 245.0 lb

## 2016-06-08 DIAGNOSIS — R7303 Prediabetes: Secondary | ICD-10-CM | POA: Diagnosis not present

## 2016-06-08 DIAGNOSIS — J45909 Unspecified asthma, uncomplicated: Secondary | ICD-10-CM

## 2016-06-08 DIAGNOSIS — J309 Allergic rhinitis, unspecified: Secondary | ICD-10-CM

## 2016-06-08 DIAGNOSIS — Z9989 Dependence on other enabling machines and devices: Secondary | ICD-10-CM

## 2016-06-08 DIAGNOSIS — K219 Gastro-esophageal reflux disease without esophagitis: Secondary | ICD-10-CM

## 2016-06-08 DIAGNOSIS — I10 Essential (primary) hypertension: Secondary | ICD-10-CM | POA: Diagnosis not present

## 2016-06-08 DIAGNOSIS — N529 Male erectile dysfunction, unspecified: Secondary | ICD-10-CM

## 2016-06-08 DIAGNOSIS — N528 Other male erectile dysfunction: Secondary | ICD-10-CM | POA: Diagnosis not present

## 2016-06-08 DIAGNOSIS — Z5181 Encounter for therapeutic drug level monitoring: Secondary | ICD-10-CM

## 2016-06-08 DIAGNOSIS — IMO0001 Reserved for inherently not codable concepts without codable children: Secondary | ICD-10-CM

## 2016-06-08 DIAGNOSIS — G4733 Obstructive sleep apnea (adult) (pediatric): Secondary | ICD-10-CM | POA: Diagnosis not present

## 2016-06-08 DIAGNOSIS — R0982 Postnasal drip: Secondary | ICD-10-CM

## 2016-06-08 LAB — HEMOGLOBIN A1C
Hgb A1c MFr Bld: 6.9 % — ABNORMAL HIGH (ref <5.7)
Mean Plasma Glucose: 151 mg/dL

## 2016-06-08 MED ORDER — OMEPRAZOLE 20 MG PO CPDR: 20.0000 mg | DELAYED_RELEASE_CAPSULE | Freq: Every day | ORAL | Status: DC | PRN

## 2016-06-08 MED ORDER — FLUTICASONE PROPIONATE 50 MCG/ACT NA SUSP: 2.0000 | Freq: Every day | NASAL | Status: DC

## 2016-06-08 MED ORDER — RANITIDINE HCL 300 MG PO CAPS: 300.0000 mg | ORAL_CAPSULE | Freq: Every evening | ORAL | Status: DC

## 2016-06-08 MED ORDER — SILDENAFIL CITRATE 20 MG PO TABS: 60.0000 mg | ORAL_TABLET | Freq: Every day | ORAL | Status: DC | PRN

## 2016-06-08 MED ORDER — LISINOPRIL 10 MG PO TABS: 10.0000 mg | ORAL_TABLET | Freq: Every day | ORAL | Status: DC

## 2016-06-08 NOTE — Assessment & Plan Note (Signed)
Try DASH guidelines, weight loss; refill ACE-I

## 2016-06-08 NOTE — Assessment & Plan Note (Addendum)
Currently medicine working well; control sugars and BP

## 2016-06-08 NOTE — Assessment & Plan Note (Signed)
Discussed risk of long-term use of PPI; will have him try to wean that down to off over the coming weeks; weight loss; start ranitidine 300 mg at bedtime; avoid triggers

## 2016-06-08 NOTE — Assessment & Plan Note (Signed)
Patient not using mask; stressed importance of weight loss

## 2016-06-08 NOTE — Patient Instructions (Addendum)
Start to skip one or two days a week of the omeprazole, and keep weaning down as able Start ranitidine every night Avoid triggers Check out the information at familydoctor.org entitled "Nutrition for Weight Loss: What You Need to Know about Fad Diets" Try to lose between 1-2 pounds per week by taking in fewer calories and burning off more calories You can succeed by limiting portions, limiting foods dense in calories and fat, becoming more active, and drinking 8 glasses of water a day (64 ounces) Don't skip meals, especially breakfast, as skipping meals may alter your metabolism Do not use over-the-counter weight loss pills or gimmicks that claim rapid weight loss A healthy BMI (or body mass index) is between 18.5 and 24.9 You can calculate your ideal BMI at the [place] website ClubMonetize.fr Try to limit saturated fats in your diet (bologna, hot dogs, barbeque, cheeseburgers, hamburgers, steak, bacon, sausage, cheese, etc.) and get more fresh fruits, vegetables, and whole grains Return in 3 months

## 2016-06-08 NOTE — Assessment & Plan Note (Signed)
Work on weight loss, see AVS

## 2016-06-08 NOTE — Progress Notes (Signed)
BP 122/86 mmHg  Pulse 94  Temp(Src) 98.5 F (36.9 C) (Oral)  Resp 16  Wt 245 lb (111.131 kg)  SpO2 93%   Subjective:    Patient ID: Wayne Flowers, male    DOB: March 04, 1976, 40 y.o.   MRN: [id number]  HPI: Wayne Flowers is a 40 y.o. male  Chief Complaint  Patient presents with  . Annual Exam   Patient is new to me; originally scheduled for a physical but he just had an insurance physical and actually needs refills on meds and had abnormal lab with the physical  His A1c at work was 6.8; he had an insurance physical; that was a month He has been borderline diabetic; dry mouth, some headaches; not blurred vision Sister has diabetes, four years younger, obese; neither parent has diabetes Slacked off at exercise lately He got down to 220 pounds; he thinks a healthy weight would be 195 pounds; he is more conscious of what he eats when he goes to the gym  High blood pressure; on ACE-I; on medicine for a few years  GERD; heartburn has never been the actual problem, just gets bad cough in the morning, Dr. Rutherford Nail said it is probably from acid in his throat and the omeprazole helps it; started to have memory loss after taking it; memory back to baseline  ED; current medicine works well, wishes to continue  Allergic rhinitis; taking nasal spray  OSA; mild sleep apnea; not any issues; not wearing CPAP now, just can't tolerate it  Depression screen [facility] 2/9 06/08/2016 12/22/2015 06/16/2015  Decreased Interest 0 0 0  Down, Depressed, Hopeless 0 0 0  PHQ - 2 Score 0 0 0   Relevant past medical, surgical, family and social history reviewed Past Medical History  Diagnosis Date  . Obesity, Class I, BMI 30.0-34.9 (see actual BMI)   . Metabolic syndrome   . Hypertension   . Allergy   . Atelectasis   . GERD (gastroesophageal reflux disease)   . ED (erectile dysfunction)   . Pleurisy   . PONV (postoperative nausea and vomiting)   . Chronic kidney disease     KIDNEY STONE  . Sleep  apnea     HAS NOT USED CPAP IN 6 MONTHS   Past Surgical History  Procedure Laterality Date  . Vasectomy    . Cystoscopy with ureteroscopy and stent placement    . Ureteroscopy with holmium laser lithotripsy Left 09/16/2015    Procedure: URETEROSCOPY WITH HOLMIUM LASER LITHOTRIPSY;  Surgeon: Royston Cowper, MD;  Location: [facility] ORS;  Service: Urology;  Laterality: Left;  . Cystoscopy w/ retrogrades Left 09/16/2015    Procedure: CYSTOSCOPY WITH RETROGRADE PYELOGRAM;  Surgeon: Royston Cowper, MD;  Location: [facility] ORS;  Service: Urology;  Laterality: Left;   Family History  Problem Relation Age of Onset  . Diabetes Mother   . Cancer Father     renal  . Diabetes Sister    Social History  Substance Use Topics  . Smoking status: Never Smoker   . Smokeless tobacco: None  . Alcohol Use: No   Interim medical history since last visit reviewed. Allergies and medications reviewed  Review of Systems Per HPI unless specifically indicated above     Objective:    BP 122/86 mmHg  Pulse 94  Temp(Src) 98.5 F (36.9 C) (Oral)  Resp 16  Wt 245 lb (111.131 kg)  SpO2 93%  Wt Readings from Last 3 Encounters:  06/08/16 245 lb (111.131 kg)  12/22/15  240 lb 11.2 oz (109.181 kg)  09/15/15 240 lb (108.863 kg)   body mass index is 36.16 kg/(m^2).  Physical Exam  Constitutional: He appears well-developed and well-nourished. No distress.  obese  HENT:  Head: Normocephalic and atraumatic.  Eyes: EOM are normal. No scleral icterus.  Neck: No thyromegaly present.  Cardiovascular: Normal rate and regular rhythm.   Pulmonary/Chest: Effort normal and breath sounds normal.  Abdominal: Soft. Bowel sounds are normal. He exhibits no distension.  Musculoskeletal: He exhibits no edema.  Neurological: Coordination normal.  Skin: Skin is warm and dry. No pallor.  Psychiatric: He has a normal mood and affect. His behavior is normal. Judgment and thought content normal.   Results for orders placed or  performed during the hospital encounter of 09/16/15  Glucose, capillary  Result Value Ref Range   Glucose-Capillary 99 65 - 99 mg/dL      Assessment & Plan:   Problem List Items Addressed This Visit      Cardiovascular and Mediastinum   Hypertension goal BP (blood pressure) < 140/90    Try DASH guidelines, weight loss; refill ACE-I      Relevant Medications   lisinopril (PRINIVIL,ZESTRIL) 10 MG tablet   sildenafil (REVATIO) 20 MG tablet     Respiratory   Obstructive sleep apnea treated with continuous positive airway pressure (CPAP)    Patient not using mask; stressed importance of weight loss      Allergic rhinitis with postnasal drip   Relevant Medications   fluticasone (FLONASE) 50 MCG/ACT nasal spray     Digestive   GERD without esophagitis    Discussed risk of long-term use of PPI; will have him try to wean that down to off over the coming weeks; weight loss; start ranitidine 300 mg at bedtime; avoid triggers      Relevant Medications   omeprazole (PRILOSEC) 20 MG capsule   ranitidine (ZANTAC) 300 MG capsule     Genitourinary   ED (erectile dysfunction) of organic origin    Currently medicine working well; control sugars and BP        Other   Pre-diabetes - Primary    Check glucose and A1c today; work on weight loss; healthy eating; discussed parameters for diagnosing diabetes (A1c of 6.5 or higher and/or fasting glucose 126 or higher); will see what his numbers today show      Relevant Orders   Hemoglobin A1c   Obesity, Class II, BMI 35-39.9, with comorbidity ([place])    Work on weight loss, see AVS       Other Visit Diagnoses    Medication monitoring encounter        Relevant Orders    Lipid panel    COMPLETE METABOLIC PANEL WITH GFR        Follow up plan: Return in about 3 months (around 09/08/2016) for follow-up.  An after-visit summary was printed and given to the patient at [facility].  Please see the patient instructions which may contain  other information and recommendations beyond what is mentioned above in the assessment and plan.  Meds ordered this encounter  Medications  . omeprazole (PRILOSEC) 20 MG capsule    Sig: Take 1 capsule (20 mg total) by mouth daily as needed. Wean down as able gradually    Dispense:  90 capsule    Refill:  0  . lisinopril (PRINIVIL,ZESTRIL) 10 MG tablet    Sig: Take 1 tablet (10 mg total) by mouth daily.    Dispense:  90 tablet  Refill:  1  . fluticasone (FLONASE) 50 MCG/ACT nasal spray    Sig: Place 2 sprays into both nostrils daily.    Dispense:  48 g    Refill:  3  . ranitidine (ZANTAC) 300 MG capsule    Sig: Take 1 capsule (300 mg total) by mouth every evening.    Dispense:  90 capsule    Refill:  3  . sildenafil (REVATIO) 20 MG tablet    Sig: Take 3-5 tablets (60-100 mg total) by mouth daily as needed (for ED).    Dispense:  40 tablet    Refill:  5   Orders Placed This Encounter  Procedures  . Lipid panel  . COMPLETE METABOLIC PANEL WITH GFR  . Hemoglobin A1c

## 2016-06-08 NOTE — Assessment & Plan Note (Addendum)
Check glucose and A1c today; work on weight loss; healthy eating; discussed parameters for diagnosing diabetes (A1c of 6.5 or higher and/or fasting glucose 126 or higher); will see what his numbers today show

## 2016-06-09 LAB — COMPLETE METABOLIC PANEL WITH GFR
ALT: 40 U/L (ref 9–46)
AST: 22 U/L (ref 10–40)
Albumin: 4.7 g/dL (ref 3.6–5.1)
Alkaline Phosphatase: 57 U/L (ref 40–115)
BUN: 17 mg/dL (ref 7–25)
CO2: 24 mmol/L (ref 20–31)
Calcium: 9.5 mg/dL (ref 8.6–10.3)
Chloride: 102 mmol/L (ref 98–110)
Creat: 0.99 mg/dL (ref 0.60–1.35)
GFR, Est African American: >89 mL/min (ref >=60)
GFR, Est Non African American: >89 mL/min (ref >=60)
Glucose, Bld: 139 mg/dL — ABNORMAL HIGH (ref 65–99)
Potassium: 5 mmol/L (ref 3.5–5.3)
Sodium: 139 mmol/L (ref 135–146)
Total Bilirubin: 0.4 mg/dL (ref 0.2–1.2)
Total Protein: 7.1 g/dL (ref 6.1–8.1)

## 2016-06-09 LAB — LIPID PANEL
Cholesterol: 205 mg/dL — ABNORMAL HIGH (ref 125–200)
HDL: 48 mg/dL (ref >=40)
LDL Cholesterol: 135 mg/dL — ABNORMAL HIGH (ref <130)
Total CHOL/HDL Ratio: 4.3 Ratio (ref <=5.0)
Triglycerides: 110 mg/dL (ref <150)
VLDL: 22 mg/dL (ref <30)

## 2016-06-23 ENCOUNTER — Encounter: Payer: Self-pay | Admitting: Family Medicine

## 2016-06-23 DIAGNOSIS — E119 Type 2 diabetes mellitus without complications: Secondary | ICD-10-CM

## 2016-06-23 HISTORY — DX: Type 2 diabetes mellitus without complications: E11.9

## 2016-06-23 MED ORDER — ATORVASTATIN CALCIUM 20 MG PO TABS: 20.0000 mg | ORAL_TABLET | Freq: Every day | ORAL | 1 refills | Status: DC

## 2016-06-23 MED ORDER — ACCU-CHEK AVIVA PLUS W/DEVICE KIT: PACK | 0 refills | Status: DC

## 2016-06-23 MED ORDER — METFORMIN HCL ER 500 MG PO TB24: 500.0000 mg | ORAL_TABLET | Freq: Every day | ORAL | 5 refills | Status: DC

## 2016-06-23 MED ORDER — GLUCOSE BLOOD VI STRP: ORAL_STRIP | 1 refills | Status: DC

## 2016-06-23 NOTE — Assessment & Plan Note (Signed)
Discussed by phone with patient; start metformin, statin, refer to diabetic educator; FSBS for first 6 months

## 2016-06-23 NOTE — Telephone Encounter (Signed)
Discussed labs with patient; new onset type 2 diabetes Start metformin Start atorvastatin Return in 4-6 weeks Refer to diabetic educator Work on weight loss, healthy eating May start checking FSBS daily for first 6 months, not needed for lifetime though

## 2016-07-19 ENCOUNTER — Encounter: Payer: 59 | Attending: Family Medicine | Admitting: Dietician

## 2016-07-19 ENCOUNTER — Ambulatory Visit (INDEPENDENT_AMBULATORY_CARE_PROVIDER_SITE_OTHER): Payer: 59 | Admitting: Family Medicine

## 2016-07-19 ENCOUNTER — Encounter: Payer: Self-pay | Admitting: Dietician

## 2016-07-19 ENCOUNTER — Encounter: Payer: Self-pay | Admitting: Family Medicine

## 2016-07-19 VITALS — BP 118/76 | Ht 69.0 in | Wt 244.9 lb

## 2016-07-19 DIAGNOSIS — E119 Type 2 diabetes mellitus without complications: Secondary | ICD-10-CM | POA: Diagnosis not present

## 2016-07-19 DIAGNOSIS — I10 Essential (primary) hypertension: Secondary | ICD-10-CM | POA: Diagnosis not present

## 2016-07-19 DIAGNOSIS — Z5181 Encounter for therapeutic drug level monitoring: Secondary | ICD-10-CM | POA: Diagnosis not present

## 2016-07-19 DIAGNOSIS — E785 Hyperlipidemia, unspecified: Secondary | ICD-10-CM | POA: Insufficient documentation

## 2016-07-19 DIAGNOSIS — K219 Gastro-esophageal reflux disease without esophagitis: Secondary | ICD-10-CM | POA: Diagnosis not present

## 2016-07-19 DIAGNOSIS — Z6836 Body mass index (BMI) 36.0-36.9, adult: Secondary | ICD-10-CM | POA: Insufficient documentation

## 2016-07-19 DIAGNOSIS — Z713 Dietary counseling and surveillance: Secondary | ICD-10-CM | POA: Insufficient documentation

## 2016-07-19 DIAGNOSIS — IMO0001 Reserved for inherently not codable concepts without codable children: Secondary | ICD-10-CM

## 2016-07-19 MED ORDER — EMPAGLIFLOZIN 10 MG PO TABS: 10.0000 mg | ORAL_TABLET | Freq: Every day | ORAL | 2 refills | Status: DC

## 2016-07-19 NOTE — Progress Notes (Signed)
BP 128/74   Pulse 97   Temp 98.3 F (36.8 C) (Oral)   Resp 14   Wt 246 lb (111.6 kg)   SpO2 97%   BMI 36.33 kg/m    Subjective:    Patient ID: Wayne Flowers, male    DOB: 1976/02/04, 40 y.o.   MRN: [id number]  HPI: Undray Colmenero is a 40 y.o. male  Chief Complaint  Patient presents with  . Follow-up    discuss DM, stopped Metformin due to diarrhea and stomach cramping   Patient was diagnosed with new onset type 2 diabetes when his A1c came back at 6.9 on June 08, 2016 He suffered abdominal cramps and diarrhea about 4-6 hours after taking the metformin, so he had to stop that He has changed how he eats Cut out soft drinks and sweet tea; drinking some V8 splash Had been eating wheat bread at home, eating less of that overall Has appt with diabetic educator today Mother and sister have diabetes He was started on statin since last visit for his LDL over 130, and he is tolerating that fine  HTN; staying away from salt; tolerating lisinopril pretty well; has a little cough but more from acid reflux; he has GERD; red sauces and spicy foods can be a trigger  Relevant past medical, surgical, family and social history reviewed Past Medical History:  Diagnosis Date  . Allergy   . Atelectasis   . Chronic kidney disease    KIDNEY STONE  . ED (erectile dysfunction)   . ED (erectile dysfunction)   . GERD (gastroesophageal reflux disease)   . Hypertension   . Metabolic syndrome   . Obesity, Class I, BMI 30.0-34.9 (see actual BMI)   . Pleurisy   . PONV (postoperative nausea and vomiting)   . Sleep apnea    HAS NOT USED CPAP IN 6 MONTHS  . Type 2 diabetes mellitus ([place]) 06/23/2016   Family History  Problem Relation Age of Onset  . Diabetes Mother   . Cancer Father     renal  . Diabetes Sister    Social History  Substance Use Topics  . Smoking status: Never Smoker  . Smokeless tobacco: Not on file  . Alcohol use 0.0 - 0.6 oz/week    Interim medical history since  last visit reviewed. Allergies and medications reviewed  Review of Systems Per HPI unless specifically indicated above     Objective:    BP 128/74   Pulse 97   Temp 98.3 F (36.8 C) (Oral)   Resp 14   Wt 246 lb (111.6 kg)   SpO2 97%   BMI 36.33 kg/m   Wt Readings from Last 3 Encounters:  07/19/16 244 lb 14.4 oz (111.1 kg)  07/19/16 246 lb (111.6 kg)  06/08/16 245 lb (111.1 kg)    Physical Exam  Constitutional: He appears well-developed and well-nourished.  obese  Eyes: No scleral icterus.  Neck: No JVD present.  Cardiovascular: Normal rate and regular rhythm.   Pulmonary/Chest: Effort normal and breath sounds normal.  Abdominal: Soft. He exhibits no distension. There is no tenderness.  Musculoskeletal: He exhibits no edema.  Neurological: He is alert.  Skin: Skin is warm. No pallor.  Psychiatric: He has a normal mood and affect.   Diabetic Foot Form - Detailed   Diabetic Foot Exam - detailed Diabetic Foot exam was performed with the following findings:  Yes 07/19/2016 11:54 AM  Visual Foot Exam completed.:  Yes  Are the toenails ingrown?:  No Normal Range of Motion:  Yes Pulse Foot Exam completed.:  Yes  Right Dorsalis Pedis:  Present Left Dorsalis Pedis:  Present  Sensory Foot Exam Completed.:  Yes Swelling:  No Semmes-Weinstein Monofilament Test R Site 1-Great Toe:  Pos L Site 1-Great Toe:  Pos  R Site 4:  Pos L Site 4:  Pos  R Site 5:  Pos L Site 5:  Pos       Results for orders placed or performed in visit on 06/08/16  Lipid panel  Result Value Ref Range   Cholesterol 205 (H) 125 - 200 mg/dL   Triglycerides 110 <150 mg/dL   HDL 48 >=40 mg/dL   Total CHOL/HDL Ratio 4.3 <=5.0 Ratio   VLDL 22 <30 mg/dL   LDL Cholesterol 135 (H) <130 mg/dL  COMPLETE METABOLIC PANEL WITH GFR  Result Value Ref Range   Sodium 139 135 - 146 mmol/L   Potassium 5.0 3.5 - 5.3 mmol/L   Chloride 102 98 - 110 mmol/L   CO2 24 20 - 31 mmol/L   Glucose, Bld 139 (H) 65 - 99 mg/dL     BUN 17 7 - 25 mg/dL   Creat 0.99 0.60 - 1.35 mg/dL   Total Bilirubin 0.4 0.2 - 1.2 mg/dL   Alkaline Phosphatase 57 40 - 115 U/L   AST 22 10 - 40 U/L   ALT 40 9 - 46 U/L   Total Protein 7.1 6.1 - 8.1 g/dL   Albumin 4.7 3.6 - 5.1 g/dL   Calcium 9.5 8.6 - 10.3 mg/dL   GFR, Est African American >89 >=60 mL/min   GFR, Est Non African American >89 >=60 mL/min  Hemoglobin A1c  Result Value Ref Range   Hgb A1c MFr Bld 6.9 (H) <5.7 %   Mean Plasma Glucose 151 mg/dL      Assessment & Plan:   Problem List Items Addressed This Visit      Cardiovascular and Mediastinum   Hypertension goal BP (blood pressure) < 140/90    Controlled on current med; continue        Digestive   GERD without esophagitis    Avoid triggers; suggested elevating HOB        Endocrine   Type 2 diabetes mellitus ([place])    Patient did not tolerate metformin; will start SGLT-2 inhibitor to help with weight loss and glucose control; if he can lose significant weight (30+ pounds), I expect his sugars to return to normal or near-normal range and he may be able to stop medicine; diabetic educator visit is this afternoon; avoid cooked carrots, sugary drinks; praise given for his efforts thus far; foot exam by MD today      Relevant Medications   empagliflozin (JARDIANCE) 10 MG TABS tablet   Other Relevant Orders   Microalbumin / creatinine urine ratio   Hemoglobin A1c     Other   Obesity, Class II, BMI 35-39.9, with comorbidity ([place])    Encouraged pt to work on weight loss; I am hopeful that the new medicine may help as well      Relevant Medications   empagliflozin (JARDIANCE) 10 MG TABS tablet   Medication monitoring encounter    Check sgpt and creatinine and -lytes in October      Relevant Orders   Comprehensive metabolic panel   Hyperlipidemia LDL goal <100    Tolerating statin; recheck lipids and sgpt in October; limit saturated fats; work on weight loss; start back with activity, but start slow  and  build up gradually, don't start with a 45 minute session on a treadmill; he agrees      Relevant Orders   Lipid panel    Other Visit Diagnoses   None.     Follow up plan: Return in about 7 weeks (around 09/09/2016) for fasting labs on October 12th and then visit with me 1-2 days later.  An after-visit summary was printed and given to the patient at [facility].  Please see the patient instructions which may contain other information and recommendations beyond what is mentioned above in the assessment and plan.  Meds ordered this encounter  Medications  . empagliflozin (JARDIANCE) 10 MG TABS tablet    Sig: Take 10 mg by mouth daily.    Dispense:  30 tablet    Refill:  2    Orders Placed This Encounter  Procedures  . Microalbumin / creatinine urine ratio  . Lipid panel  . Hemoglobin A1c  . Comprehensive metabolic panel

## 2016-07-19 NOTE — Assessment & Plan Note (Signed)
Patient did not tolerate metformin; will start SGLT-2 inhibitor to help with weight loss and glucose control; if he can lose significant weight (30+ pounds), I expect his sugars to return to normal or near-normal range and he may be able to stop medicine; diabetic educator visit is this afternoon; avoid cooked carrots, sugary drinks; praise given for his efforts thus far; foot exam by MD today

## 2016-07-19 NOTE — Assessment & Plan Note (Signed)
Avoid triggers; suggested elevating HOB

## 2016-07-19 NOTE — Patient Instructions (Signed)
  Check blood sugars 2 x day before breakfast and 2 hrs after supper every day Exercise:  Continue cardio for  30-60  minutes   4  days a week Avoid sugar sweetened drinks (soda, tea, coffee, sports drinks, juices) Limit intake of fried foods and sweets Eat 3 meals day,  1-2  snacks a day-at bedtime and in afternoon if needed Space meals 4-6 hours apart Make eye doctor appointment Bring blood sugar records to the next appointment/class Get a Sharps container Return for appointment/classes on: 07-26-16

## 2016-07-19 NOTE — Assessment & Plan Note (Signed)
Controlled on current med; continue

## 2016-07-19 NOTE — Patient Instructions (Signed)
Stay off the metformin Start Jardiance for glucose control Build up exercise gradually Return in October for fasting labs and then visit with me

## 2016-07-19 NOTE — Assessment & Plan Note (Signed)
Tolerating statin; recheck lipids and sgpt in October; limit saturated fats; work on weight loss; start back with activity, but start slow and build up gradually, don't start with a 45 minute session on a treadmill; he agrees

## 2016-07-19 NOTE — Progress Notes (Signed)
Diabetes Self-Management Education  Visit Type: First/Initial  Appt. Start Time: 1645 Appt. End Time: [id number]  07/19/2016  [name], identified by name and date of birth, is a 40 y.o. male with a diagnosis of Diabetes: Type 2.   ASSESSMENT  Blood pressure 118/76, height 5\' 9"  (1.753 m), weight 244 lb 14.4 oz (111.1 kg). Body mass index is 36.17 kg/m.      Diabetes Self-Management Education - 07/19/16 1804      Visit Information   Visit Type First/Initial     Initial Visit   Diabetes Type Type 2     Health Coping   How would you rate your overall health? Good     Psychosocial Assessment   Patient Belief/Attitude about Diabetes Motivated to manage diabetes   Self-care barriers None   Patient Concerns Nutrition/Meal planning;Medication;Glycemic Control;Weight Control   Special Needs None   Preferred Learning Style Visual   Learning Readiness Ready   What is the last grade level you completed in school? 12+     Pre-Education Assessment   Patient understands the diabetes disease and treatment process. Needs Instruction   Patient understands incorporating nutritional management into lifestyle. Needs Instruction   Patient undertands incorporating physical activity into lifestyle. Needs Instruction   Patient understands using medications safely. Needs Instruction   Patient understands monitoring blood glucose, interpreting and using results Needs Instruction   Patient understands prevention, detection, and treatment of acute complications. Needs Instruction   Patient understands prevention, detection, and treatment of chronic complications. Needs Instruction   Patient understands how to develop strategies to address psychosocial issues. Needs Instruction   Patient understands how to develop strategies to promote health/change behavior. Needs Instruction     Complications   Last HgB A1C per patient/outside source 6.9 %  06-08-16   How often do you check your blood  sugar? 0 times/day (not testing)   Have you had a dilated eye exam in the past 12 months? No  10 years ago   Have you had a dental exam in the past 12 months? Yes  2-3 months ago-goes every 6 months   Are you checking your feet? No     Dietary Intake   Breakfast --  eats 3 meals/day -eats breakfast at 7am   Snack (morning) --  none   Lunch --  usually eats lunch at 11:30a-eats fried foods and sweets 4-5x/wk.-eats snack foods 2-3x/wk.   Snack (afternoon) --  none   Dinner --  eats supper at 6:30p   Snack (evening) --  eats snack at 9p   Beverage(s) --  drinks fruit juice 2x/day; drinks sweetened beverages 4-5x/day and diet drinks 2-3x/day; drinks water 2-3x/day; drinks milk 1x/day     Exercise   Exercise Type ADL's  cardio 1 hr 3x/wk   How many days per week to you exercise? 3   How many minutes per day do you exercise? 60   Total minutes per week of exercise 180     Patient Education   Previous Diabetes Education No   Disease state  Definition of diabetes, type 1 and 2, and the diagnosis of diabetes;Explored patient's options for treatment of their diabetes;Factors that contribute to the development of diabetes   Nutrition management  Role of diet in the treatment of diabetes and the relationship between the three main macronutrients and blood glucose level;Food label reading, portion sizes and measuring food.;Carbohydrate counting   Physical activity and exercise  Role of exercise on diabetes management, blood pressure  control and cardiac health.;Helped patient identify appropriate exercises in relation to his/her diabetes, diabetes complications and other health issue.   Medications Reviewed patients medication for diabetes, action, purpose, timing of dose and side effects.  pt was taking Metformin but stopped it on 07-15-16 due to GI upset; saw MD today and MD prescribed Jardiance 10 mg daily-pt plans to pick it up at pharmacy and start it today   Monitoring Taught/evaluated  SMBG meter.;Identified appropriate SMBG and/or A1C goals.;Yearly dilated eye exam  pt has a True Metrix meter at home but no lancets-instructed pt on use of True Metrix meter and gave pt 20 new lancets; checked BG 98 ac supper   Chronic complications Relationship between chronic complications and blood glucose control;Dental care;Retinopathy and reason for yearly dilated eye exams   Psychosocial adjustment Role of stress on diabetes   Personal strategies to promote health Lifestyle issues that need to be addressed for better diabetes care;Helped patient develop diabetes management plan for (enter comment)     Outcomes   Expected Outcomes Demonstrated interest in learning. Expect positive outcomes      Individualized Plan for Diabetes Self-Management Training:   Learning Objective:  Patient will have a greater understanding of diabetes self-management. Patient education plan is to attend individual and/or group sessions per assessed needs and concerns.   Plan:   Patient Instructions   Check blood sugars 2 x day before breakfast and 2 hrs after supper every day Exercise:  Continue cardio for  30-60  minutes   4  days a week Avoid sugar sweetened drinks (soda, tea, coffee, sports drinks, juices) Limit intake of fried foods and sweets Eat 3 meals day,  1-2  snacks a day-at bedtime and in afternoon if needed Space meals 4-6 hours apart Make eye doctor appointment Bring blood sugar records to the next appointment/class Get a Sharps container Return for appointment/classes on: 07-26-16   Expected Outcomes:  Demonstrated interest in learning. Expect positive outcomes  Education material provided: General meal planning Guidelines, 20 lancets  If problems or questions, patient to contact team via:  [phone]  Future DSME appointment:  07-26-16

## 2016-07-19 NOTE — Assessment & Plan Note (Signed)
Check sgpt and creatinine and -lytes in October

## 2016-07-19 NOTE — Assessment & Plan Note (Signed)
Encouraged pt to work on weight loss; I am hopeful that the new medicine may help as well

## 2016-07-26 ENCOUNTER — Encounter: Payer: Self-pay | Admitting: Dietician

## 2016-07-26 ENCOUNTER — Encounter: Payer: 59 | Admitting: Dietician

## 2016-07-26 VITALS — Ht 69.0 in | Wt 239.8 lb

## 2016-07-26 DIAGNOSIS — Z713 Dietary counseling and surveillance: Secondary | ICD-10-CM | POA: Diagnosis not present

## 2016-07-26 DIAGNOSIS — E119 Type 2 diabetes mellitus without complications: Secondary | ICD-10-CM

## 2016-07-26 NOTE — Progress Notes (Signed)

## 2016-08-09 ENCOUNTER — Encounter: Payer: Self-pay | Admitting: Dietician

## 2016-08-09 ENCOUNTER — Encounter: Payer: 59 | Attending: Family Medicine | Admitting: Dietician

## 2016-08-09 VITALS — Ht 69.0 in | Wt 239.6 lb

## 2016-08-09 DIAGNOSIS — Z6836 Body mass index (BMI) 36.0-36.9, adult: Secondary | ICD-10-CM | POA: Insufficient documentation

## 2016-08-09 DIAGNOSIS — Z713 Dietary counseling and surveillance: Secondary | ICD-10-CM | POA: Diagnosis present

## 2016-08-09 DIAGNOSIS — E119 Type 2 diabetes mellitus without complications: Secondary | ICD-10-CM

## 2016-08-09 NOTE — Progress Notes (Signed)

## 2016-08-16 ENCOUNTER — Encounter: Payer: Self-pay | Admitting: Dietician

## 2016-08-16 NOTE — Progress Notes (Signed)
Pt did not come to class 3 tonight-called pt who is in ED with his brother-rescheduled class 3 on 09-06-16.

## 2016-08-24 LAB — HM DIABETES EYE EXAM

## 2016-08-30 ENCOUNTER — Encounter: Payer: 59 | Attending: Family Medicine

## 2016-08-30 DIAGNOSIS — Z6836 Body mass index (BMI) 36.0-36.9, adult: Secondary | ICD-10-CM | POA: Insufficient documentation

## 2016-08-30 DIAGNOSIS — Z713 Dietary counseling and surveillance: Secondary | ICD-10-CM | POA: Insufficient documentation

## 2016-08-30 DIAGNOSIS — E119 Type 2 diabetes mellitus without complications: Secondary | ICD-10-CM | POA: Insufficient documentation

## 2016-08-31 ENCOUNTER — Encounter: Payer: Self-pay | Admitting: Dietician

## 2016-08-31 NOTE — Progress Notes (Signed)
Pt did not come to last half of class 2 on 08-30-16; pt is scheduled to return for class 3 on 09-06-16

## 2016-09-06 ENCOUNTER — Telehealth: Payer: Self-pay | Admitting: Dietician

## 2016-09-06 NOTE — Telephone Encounter (Signed)
Pt did not come to class 3 tonight-called pt-no answer-left message on voice mail for pt to call us to reschedule class 3 and last half of class 2

## 2016-09-09 ENCOUNTER — Other Ambulatory Visit: Payer: Self-pay

## 2016-09-09 ENCOUNTER — Ambulatory Visit: Payer: 59 | Admitting: Family Medicine

## 2016-09-09 ENCOUNTER — Other Ambulatory Visit (INDEPENDENT_AMBULATORY_CARE_PROVIDER_SITE_OTHER): Payer: 59

## 2016-09-09 DIAGNOSIS — E785 Hyperlipidemia, unspecified: Secondary | ICD-10-CM

## 2016-09-09 DIAGNOSIS — E119 Type 2 diabetes mellitus without complications: Secondary | ICD-10-CM

## 2016-09-09 DIAGNOSIS — Z5181 Encounter for therapeutic drug level monitoring: Secondary | ICD-10-CM

## 2016-09-09 LAB — LIPID PANEL
Cholesterol: 119 mg/dL — ABNORMAL LOW (ref 125–200)
HDL: 38 mg/dL — ABNORMAL LOW (ref >=40)
LDL Cholesterol: 64 mg/dL (ref <130)
Total CHOL/HDL Ratio: 3.1 Ratio (ref <=5.0)
Triglycerides: 87 mg/dL (ref <150)
VLDL: 17 mg/dL (ref <30)

## 2016-09-09 LAB — MICROALBUMIN / CREATININE URINE RATIO
Creatinine, Urine: 165 mg/dL (ref 20–370)
Microalb Creat Ratio: 2 mcg/mg creat (ref <30)
Microalb, Ur: 0.3 mg/dL

## 2016-09-09 LAB — COMPLETE METABOLIC PANEL WITH GFR
ALT: 29 U/L (ref 9–46)
AST: 18 U/L (ref 10–40)
Albumin: 4.4 g/dL (ref 3.6–5.1)
Alkaline Phosphatase: 58 U/L (ref 40–115)
BUN: 14 mg/dL (ref 7–25)
CO2: 23 mmol/L (ref 20–31)
Calcium: 9.2 mg/dL (ref 8.6–10.3)
Chloride: 106 mmol/L (ref 98–110)
Creat: 0.91 mg/dL (ref 0.60–1.35)
GFR, Est African American: >89 mL/min (ref >=60)
GFR, Est Non African American: >89 mL/min (ref >=60)
Glucose, Bld: 115 mg/dL — ABNORMAL HIGH (ref 65–99)
Potassium: 4.8 mmol/L (ref 3.5–5.3)
Sodium: 140 mmol/L (ref 135–146)
Total Bilirubin: 0.4 mg/dL (ref 0.2–1.2)
Total Protein: 6.6 g/dL (ref 6.1–8.1)

## 2016-09-10 LAB — HEMOGLOBIN A1C
Hgb A1c MFr Bld: 6.1 % — ABNORMAL HIGH (ref <5.7)
Mean Plasma Glucose: 128 mg/dL

## 2016-09-13 ENCOUNTER — Encounter: Payer: Self-pay | Admitting: Family Medicine

## 2016-09-13 ENCOUNTER — Ambulatory Visit (INDEPENDENT_AMBULATORY_CARE_PROVIDER_SITE_OTHER): Payer: 59 | Admitting: Family Medicine

## 2016-09-13 VITALS — BP 122/82 | HR 76 | Temp 98.0°F | Resp 14 | Wt 233.0 lb

## 2016-09-13 DIAGNOSIS — R0982 Postnasal drip: Secondary | ICD-10-CM | POA: Diagnosis not present

## 2016-09-13 DIAGNOSIS — E119 Type 2 diabetes mellitus without complications: Secondary | ICD-10-CM | POA: Diagnosis not present

## 2016-09-13 DIAGNOSIS — J309 Allergic rhinitis, unspecified: Secondary | ICD-10-CM | POA: Diagnosis not present

## 2016-09-13 DIAGNOSIS — Z6834 Body mass index (BMI) 34.0-34.9, adult: Secondary | ICD-10-CM | POA: Diagnosis not present

## 2016-09-13 DIAGNOSIS — Z23 Encounter for immunization: Secondary | ICD-10-CM

## 2016-09-13 DIAGNOSIS — I10 Essential (primary) hypertension: Secondary | ICD-10-CM | POA: Diagnosis not present

## 2016-09-13 DIAGNOSIS — E6609 Other obesity due to excess calories: Secondary | ICD-10-CM | POA: Diagnosis not present

## 2016-09-13 MED ORDER — LISINOPRIL 10 MG PO TABS: 10.0000 mg | ORAL_TABLET | Freq: Every day | ORAL | 1 refills | Status: DC

## 2016-09-13 MED ORDER — ATORVASTATIN CALCIUM 20 MG PO TABS: 20.0000 mg | ORAL_TABLET | Freq: Every day | ORAL | 3 refills | Status: DC

## 2016-09-13 MED ORDER — EMPAGLIFLOZIN 10 MG PO TABS: 10.0000 mg | ORAL_TABLET | Freq: Every day | ORAL | 1 refills | Status: DC

## 2016-09-13 NOTE — Patient Instructions (Signed)
Keep up the great job with weight loss and eating better You received the flu shot today; it should protect you against the flu virus over the coming months; it will take about two weeks for antibodies to develop; do try to stay away from hospitals, nursing homes, and daycares during peak flu season; taking extra vitamin C daily during flu season may help you avoid getting sick Try to limit saturated fats in your diet (bologna, hot dogs, barbeque, cheeseburgers, hamburgers, steak, bacon, sausage, cheese, etc.) and get more fresh fruits, vegetables, and whole grains Please do see your eye doctor regularly, and have your eyes examined every year (or more often per his or her recommendation) Check your feet every night and let me know right away of any sores, infections, numbness, etc. Try to limit sweets, white bread, white rice, white potatoes It is okay with me for you to not check your fingerstick blood sugars (per [employer] of Endocrinology Best Practices), unless you are interested and feel it would be helpful for you Return in 6 months for fasting labs and visit

## 2016-09-13 NOTE — Assessment & Plan Note (Signed)
Praise given for weight loss; target weight of 200 pounds over the next 6-12 months

## 2016-09-13 NOTE — Assessment & Plan Note (Signed)
Well-controlled; follow DASH guidelines 

## 2016-09-13 NOTE — Progress Notes (Signed)
BP 122/82   Pulse 76   Temp 98 F (36.7 C) (Oral)   Resp 14   Wt 233 lb (105.7 kg)   SpO2 95%   BMI 34.41 kg/m    Subjective:    Patient ID: Wayne Flowers, male    DOB: [birth year], 40 y.o.   MRN: [id number]  HPI: Wayne Flowers is a 40 y.o. male  Chief Complaint  Patient presents with  . Labs Only    review results  . Medication Refill    need 90 day supply   Patient is here to go over his labs, type 2 diabetes Since last visit, really changed diet; cut out sodas, sugary things, sugar-free Jello for dessert His A1c has come down from 6.9 to 6.1 Glucose was 115 Urine microalbumin:creatinine was <30  High cholesterol; he has brought his total chol down from 205 to 119; his TG level was 87, HDL 38, and LDL dropped from 135 to 64; LFTs normal  Obesity; weight down 6 pounds since last visit  GERD; using H2 blocker and eating better  Thinks he broke pinky toe on left foot while camping; stubbed his pinky toe; getting better; no major problems  Depression screen [facility] 2/9 09/13/2016 06/08/2016 12/22/2015 06/16/2015  Decreased Interest 0 0 0 0  Down, Depressed, Hopeless 0 0 0 0  PHQ - 2 Score 0 0 0 0   Relevant past medical, surgical, family and social history reviewed Past Medical History:  Diagnosis Date  . Allergy   . Atelectasis   . Chronic kidney disease    KIDNEY STONE  . ED (erectile dysfunction)   . ED (erectile dysfunction)   . GERD (gastroesophageal reflux disease)   . Hypertension   . Metabolic syndrome   . Obesity, Class I, BMI 30.0-34.9 (see actual BMI)   . Pleurisy   . PONV (postoperative nausea and vomiting)   . Sleep apnea    HAS NOT USED CPAP IN 6 MONTHS  . Type 2 diabetes mellitus ([place]) 06/23/2016   Past Surgical History:  Procedure Laterality Date  . CYSTOSCOPY W/ RETROGRADES Left 09/16/2015   Procedure: CYSTOSCOPY WITH RETROGRADE PYELOGRAM;  Surgeon: Royston Cowper, MD;  Location: [facility] ORS;  Service: Urology;  Laterality: Left;  .  CYSTOSCOPY WITH URETEROSCOPY AND STENT PLACEMENT    . URETEROSCOPY WITH HOLMIUM LASER LITHOTRIPSY Left 09/16/2015   Procedure: URETEROSCOPY WITH HOLMIUM LASER LITHOTRIPSY;  Surgeon: Royston Cowper, MD;  Location: [facility] ORS;  Service: Urology;  Laterality: Left;  Marland Kitchen VASECTOMY     Family History  Problem Relation Age of Onset  . Diabetes Mother   . Cancer Father     renal  . Diabetes Sister    Social History  Substance Use Topics  . Smoking status: Never Smoker  . Smokeless tobacco: Not on file  . Alcohol use 0.0 - 0.6 oz/week    Interim medical history since last visit reviewed. Allergies and medications reviewed  Review of Systems Per HPI unless specifically indicated above     Objective:    BP 122/82   Pulse 76   Temp 98 F (36.7 C) (Oral)   Resp 14   Wt 233 lb (105.7 kg)   SpO2 95%   BMI 34.41 kg/m   Wt Readings from Last 3 Encounters:  09/13/16 233 lb (105.7 kg)  08/09/16 239 lb 9.6 oz (108.7 kg)  07/26/16 239 lb 12.8 oz (108.8 kg)    Physical Exam  Constitutional: He appears well-developed and well-nourished.  No distress.  HENT:  Head: Normocephalic and atraumatic.  Eyes: No scleral icterus.  Cardiovascular: Normal rate and regular rhythm.   Pulmonary/Chest: Effort normal and breath sounds normal.  Abdominal: He exhibits no distension.  Musculoskeletal: He exhibits no edema.       Left foot: There is tenderness (pinky toe slightly swollen, mild bruised discoloration).  Neurological: He is alert.  Skin: Skin is warm.  Psychiatric: He has a normal mood and affect. His behavior is normal. Judgment and thought content normal.   Diabetic Foot Form - Detailed   Diabetic Foot Exam - detailed Diabetic Foot exam was performed with the following findings:  Yes 09/13/2016  9:11 AM  Visual Foot Exam completed.:  Yes  Are the toenails ingrown?:  No Normal Range of Motion:  Yes Pulse Foot Exam completed.:  Yes  Right Dorsalis Pedis:  Present Left Dorsalis Pedis:   Present  Sensory Foot Exam Completed.:  Yes Semmes-Weinstein Monofilament Test R Site 1-Great Toe:  Pos L Site 1-Great Toe:  Pos  R Site 4:  Pos L Site 4:  Pos  R Site 5:  Pos L Site 5:  Pos        Results for orders placed or performed in visit on 09/09/16  COMPLETE METABOLIC PANEL WITH GFR  Result Value Ref Range   Sodium 140 135 - 146 mmol/L   Potassium 4.8 3.5 - 5.3 mmol/L   Chloride 106 98 - 110 mmol/L   CO2 23 20 - 31 mmol/L   Glucose, Bld 115 (H) 65 - 99 mg/dL   BUN 14 7 - 25 mg/dL   Creat 0.91 0.60 - 1.35 mg/dL   Total Bilirubin 0.4 0.2 - 1.2 mg/dL   Alkaline Phosphatase 58 40 - 115 U/L   AST 18 10 - 40 U/L   ALT 29 9 - 46 U/L   Total Protein 6.6 6.1 - 8.1 g/dL   Albumin 4.4 3.6 - 5.1 g/dL   Calcium 9.2 8.6 - 10.3 mg/dL   GFR, Est African American >89 >=60 mL/min   GFR, Est Non African American >89 >=60 mL/min  Lipid panel  Result Value Ref Range   Cholesterol 119 (L) 125 - 200 mg/dL   Triglycerides 87 <150 mg/dL   HDL 38 (L) >=40 mg/dL   Total CHOL/HDL Ratio 3.1 <=5.0 Ratio   VLDL 17 <30 mg/dL   LDL Cholesterol 64 <130 mg/dL  HgB A1c  Result Value Ref Range   Hgb A1c MFr Bld 6.1 (H) <5.7 %   Mean Plasma Glucose 128 mg/dL  Urine Microalbumin w/creat. ratio  Result Value Ref Range   Creatinine, Urine 165 20 - 370 mg/dL   Microalb, Ur 0.3 Not estab mg/dL   Microalb Creat Ratio 2 <30 mcg/mg creat      Assessment & Plan:   Problem List Items Addressed This Visit      Cardiovascular and Mediastinum   Hypertension goal BP (blood pressure) < 140/90    Well-controlled; follow DASH guidelines      Relevant Medications   atorvastatin (LIPITOR) 20 MG tablet   lisinopril (PRINIVIL,ZESTRIL) 10 MG tablet     Respiratory   Allergic rhinitis with postnasal drip     Endocrine   Type 2 diabetes mellitus ([place]) - Primary    Excellent control; praise given for weight loss and healthier eating; continue medicine for now; consider stopping medicine in April once  seeing weight and A1c      Relevant Medications   atorvastatin (  LIPITOR) 20 MG tablet   empagliflozin (JARDIANCE) 10 MG TABS tablet   lisinopril (PRINIVIL,ZESTRIL) 10 MG tablet     Other   Obesity    Praise given for weight loss; target weight of 200 pounds over the next 6-12 months      Relevant Medications   empagliflozin (JARDIANCE) 10 MG TABS tablet   Needs flu shot   Relevant Orders   Flu Vaccine QUAD 36+ mos PF IM (Fluarix & Fluzone Quad PF) (Completed)    Other Visit Diagnoses   None.      Follow up plan: Return in about 6 months (around 03/11/2017) for fasting labs and visit.  An after-visit summary was printed and given to the patient at [facility].  Please see the patient instructions which may contain other information and recommendations beyond what is mentioned above in the assessment and plan.  Meds ordered this encounter  Medications  . atorvastatin (LIPITOR) 20 MG tablet    Sig: Take 1 tablet (20 mg total) by mouth at bedtime. For cholesterol    Dispense:  90 tablet    Refill:  3  . empagliflozin (JARDIANCE) 10 MG TABS tablet    Sig: Take 10 mg by mouth daily.    Dispense:  90 tablet    Refill:  1  . lisinopril (PRINIVIL,ZESTRIL) 10 MG tablet    Sig: Take 1 tablet (10 mg total) by mouth daily.    Dispense:  90 tablet    Refill:  1    Orders Placed This Encounter  Procedures  . Flu Vaccine QUAD 36+ mos PF IM (Fluarix & Fluzone Quad PF)

## 2016-09-13 NOTE — Assessment & Plan Note (Signed)
Excellent control; praise given for weight loss and healthier eating; continue medicine for now; consider stopping medicine in April once seeing weight and A1c

## 2016-09-21 ENCOUNTER — Encounter: Payer: Self-pay | Admitting: Dietician

## 2016-09-21 NOTE — Progress Notes (Signed)
Have not heard from pt-pt discharged

## 2016-09-30 ENCOUNTER — Other Ambulatory Visit: Payer: Self-pay | Admitting: Family Medicine

## 2016-09-30 NOTE — Telephone Encounter (Signed)
Jardiance Rx approved

## 2016-12-17 ENCOUNTER — Other Ambulatory Visit: Payer: Self-pay

## 2016-12-17 NOTE — Telephone Encounter (Signed)
Patient needs a 90 day supply 

## 2016-12-18 MED ORDER — ATORVASTATIN CALCIUM 20 MG PO TABS: 20.0000 mg | ORAL_TABLET | Freq: Every day | ORAL | 2 refills | Status: DC

## 2016-12-18 NOTE — Telephone Encounter (Signed)
Oct 2017 labs reviewed; Rx approved

## 2017-02-16 ENCOUNTER — Other Ambulatory Visit: Payer: Self-pay | Admitting: Family Medicine

## 2017-02-17 NOTE — Telephone Encounter (Signed)
Last Cr and K+ reviewed; Rx approved 

## 2017-03-11 ENCOUNTER — Ambulatory Visit (INDEPENDENT_AMBULATORY_CARE_PROVIDER_SITE_OTHER): Payer: 59 | Admitting: Family Medicine

## 2017-03-11 ENCOUNTER — Encounter: Payer: Self-pay | Admitting: Family Medicine

## 2017-03-11 DIAGNOSIS — H9193 Unspecified hearing loss, bilateral: Secondary | ICD-10-CM | POA: Diagnosis not present

## 2017-03-11 DIAGNOSIS — R0982 Postnasal drip: Secondary | ICD-10-CM

## 2017-03-11 DIAGNOSIS — Z87442 Personal history of urinary calculi: Secondary | ICD-10-CM | POA: Diagnosis not present

## 2017-03-11 DIAGNOSIS — K219 Gastro-esophageal reflux disease without esophagitis: Secondary | ICD-10-CM

## 2017-03-11 DIAGNOSIS — E119 Type 2 diabetes mellitus without complications: Secondary | ICD-10-CM | POA: Diagnosis not present

## 2017-03-11 DIAGNOSIS — H9190 Unspecified hearing loss, unspecified ear: Secondary | ICD-10-CM | POA: Insufficient documentation

## 2017-03-11 DIAGNOSIS — I10 Essential (primary) hypertension: Secondary | ICD-10-CM | POA: Diagnosis not present

## 2017-03-11 DIAGNOSIS — E785 Hyperlipidemia, unspecified: Secondary | ICD-10-CM | POA: Diagnosis not present

## 2017-03-11 DIAGNOSIS — J309 Allergic rhinitis, unspecified: Secondary | ICD-10-CM

## 2017-03-11 LAB — COMPLETE METABOLIC PANEL WITH GFR
ALT: 32 U/L (ref 9–46)
AST: 19 U/L (ref 10–40)
Albumin: 4.3 g/dL (ref 3.6–5.1)
Alkaline Phosphatase: 57 U/L (ref 40–115)
BUN: 17 mg/dL (ref 7–25)
CO2: 26 mmol/L (ref 20–31)
Calcium: 9.1 mg/dL (ref 8.6–10.3)
Chloride: 105 mmol/L (ref 98–110)
Creat: 0.97 mg/dL (ref 0.60–1.35)
GFR, Est African American: >89 mL/min (ref >=60)
GFR, Est Non African American: >89 mL/min (ref >=60)
Glucose, Bld: 110 mg/dL — ABNORMAL HIGH (ref 65–99)
Potassium: 4.7 mmol/L (ref 3.5–5.3)
Sodium: 140 mmol/L (ref 135–146)
Total Bilirubin: 0.6 mg/dL (ref 0.2–1.2)
Total Protein: 6.7 g/dL (ref 6.1–8.1)

## 2017-03-11 LAB — LIPID PANEL
Cholesterol: 141 mg/dL (ref <200)
HDL: 41 mg/dL (ref >40)
LDL Cholesterol: 82 mg/dL (ref <100)
Total CHOL/HDL Ratio: 3.4 Ratio (ref <5.0)
Triglycerides: 89 mg/dL (ref <150)
VLDL: 18 mg/dL (ref <30)

## 2017-03-11 MED ORDER — FLUTICASONE PROPIONATE 50 MCG/ACT NA SUSP: 2.0000 | Freq: Every day | NASAL | 3 refills | Status: DC

## 2017-03-11 NOTE — Assessment & Plan Note (Signed)
Using nasal corticosteroid; avoid decongestants

## 2017-03-11 NOTE — Assessment & Plan Note (Signed)
None recently; hydrate

## 2017-03-11 NOTE — Assessment & Plan Note (Signed)
Controlled today; avoid decongestants

## 2017-03-11 NOTE — Progress Notes (Signed)
BP 130/78   Pulse 95   Temp 98.4 F (36.9 C) (Oral)   Resp 14   Wt 239 lb (108.4 kg)   SpO2 94%   BMI 35.29 kg/m    Subjective:    Patient ID: Wayne Flowers, male    DOB: Jun 29, 1976, 41 y.o.   MRN: [id number]  HPI: Richardson Dubree is a 41 y.o. male  Chief Complaint  Patient presents with  . Follow-up    HPI BP checked away from doctor and controlled; adds some salt but much less than before Type 2 DM; eye exam UTD; no probs with feet; limiting sugars, unsweetened tea and diet drinks High cholesterol, limit sat fats; no abd pain Hearing issues for a while; right ear is worse; works in [employer]; has gotten where he can't hear well Heartburn / GERD much better; knows to avoid triggers No kidney stones in a long time; knows to hydrate  Depression screen [facility] 2/9 03/11/2017 09/13/2016 06/08/2016 12/22/2015 06/16/2015  Decreased Interest 0 0 0 0 0  Down, Depressed, Hopeless 0 0 0 0 0  PHQ - 2 Score 0 0 0 0 0    Relevant past medical, surgical, family and social history reviewed Past Medical History:  Diagnosis Date  . Allergy   . Atelectasis   . Chronic kidney disease    KIDNEY STONE  . ED (erectile dysfunction)   . ED (erectile dysfunction)   . GERD (gastroesophageal reflux disease)   . Hypertension   . Metabolic syndrome   . Obesity, Class I, BMI 30.0-34.9 (see actual BMI)   . Pleurisy   . PONV (postoperative nausea and vomiting)   . Sleep apnea    HAS NOT USED CPAP IN 6 MONTHS  . Type 2 diabetes mellitus ([place]) 06/23/2016   Past Surgical History:  Procedure Laterality Date  . CYSTOSCOPY W/ RETROGRADES Left 09/16/2015   Procedure: CYSTOSCOPY WITH RETROGRADE PYELOGRAM;  Surgeon: Royston Cowper, MD;  Location: [facility] ORS;  Service: Urology;  Laterality: Left;  . CYSTOSCOPY WITH URETEROSCOPY AND STENT PLACEMENT    . URETEROSCOPY WITH HOLMIUM LASER LITHOTRIPSY Left 09/16/2015   Procedure: URETEROSCOPY WITH HOLMIUM LASER LITHOTRIPSY;  Surgeon: Royston Cowper, MD;   Location: [facility] ORS;  Service: Urology;  Laterality: Left;  Marland Kitchen VASECTOMY     Family History  Problem Relation Age of Onset  . Diabetes Mother   . Cancer Father     renal  . Diabetes Sister    Social History  Substance Use Topics  . Smoking status: Never Smoker  . Smokeless tobacco: Never Used  . Alcohol use 0.0 - 0.6 oz/week    Interim medical history since last visit reviewed. Allergies and medications reviewed  Review of Systems  Constitutional: Negative for unexpected weight change.  HENT: Positive for hearing loss.   Cardiovascular: Negative for chest pain.   Per HPI unless specifically indicated above     Objective:    BP 130/78   Pulse 95   Temp 98.4 F (36.9 C) (Oral)   Resp 14   Wt 239 lb (108.4 kg)   SpO2 94%   BMI 35.29 kg/m   Wt Readings from Last 3 Encounters:  03/11/17 239 lb (108.4 kg)  09/13/16 233 lb (105.7 kg)  08/09/16 239 lb 9.6 oz (108.7 kg)    Physical Exam  Constitutional: He appears well-developed and well-nourished. No distress.  HENT:  Head: Normocephalic and atraumatic.  Right Ear: Tympanic membrane, external ear and ear canal normal.  Left  Ear: Tympanic membrane, external ear and ear canal normal.  Mouth/Throat: Oropharynx is clear and moist.  Eyes: No scleral icterus.  Cardiovascular: Normal rate and regular rhythm.   Pulmonary/Chest: Effort normal and breath sounds normal.  Abdominal: He exhibits no distension.  Musculoskeletal: He exhibits no edema.  Neurological: He is alert.  Skin: Skin is warm.  Psychiatric: He has a normal mood and affect. His behavior is normal. His mood appears not anxious. He does not exhibit a depressed mood.   Diabetic Foot Form - Detailed   Diabetic Foot Exam - detailed Diabetic Foot exam was performed with the following findings:  Yes 03/11/2017  9:01 AM  Visual Foot Exam completed.:  Yes  Are the toenails ingrown?:  No Normal Range of Motion:  Yes Pulse Foot Exam completed.:  Yes  Right Dorsalis  Pedis:  Present Left Dorsalis Pedis:  Present  Sensory Foot Exam Completed.:  Yes Swelling:  No Semmes-Weinstein Monofilament Test R Site 1-Great Toe:  Pos L Site 1-Great Toe:  Pos  R Site 4:  Pos L Site 4:  Pos  R Site 5:  Pos L Site 5:  Pos       Results for orders placed or performed in visit on 09/09/16  COMPLETE METABOLIC PANEL WITH GFR  Result Value Ref Range   Sodium 140 135 - 146 mmol/L   Potassium 4.8 3.5 - 5.3 mmol/L   Chloride 106 98 - 110 mmol/L   CO2 23 20 - 31 mmol/L   Glucose, Bld 115 (H) 65 - 99 mg/dL   BUN 14 7 - 25 mg/dL   Creat 0.91 0.60 - 1.35 mg/dL   Total Bilirubin 0.4 0.2 - 1.2 mg/dL   Alkaline Phosphatase 58 40 - 115 U/L   AST 18 10 - 40 U/L   ALT 29 9 - 46 U/L   Total Protein 6.6 6.1 - 8.1 g/dL   Albumin 4.4 3.6 - 5.1 g/dL   Calcium 9.2 8.6 - 10.3 mg/dL   GFR, Est African American >89 >=60 mL/min   GFR, Est Non African American >89 >=60 mL/min  Lipid panel  Result Value Ref Range   Cholesterol 119 (L) 125 - 200 mg/dL   Triglycerides 87 <150 mg/dL   HDL 38 (L) >=40 mg/dL   Total CHOL/HDL Ratio 3.1 <=5.0 Ratio   VLDL 17 <30 mg/dL   LDL Cholesterol 64 <130 mg/dL  HgB A1c  Result Value Ref Range   Hgb A1c MFr Bld 6.1 (H) <5.7 %   Mean Plasma Glucose 128 mg/dL  Urine Microalbumin w/creat. ratio  Result Value Ref Range   Creatinine, Urine 165 20 - 370 mg/dL   Microalb, Ur 0.3 Not estab mg/dL   Microalb Creat Ratio 2 <30 mcg/mg creat      Assessment & Plan:   Problem List Items Addressed This Visit      Cardiovascular and Mediastinum   Hypertension goal BP (blood pressure) < 140/90    Controlled today; avoid decongestants        Respiratory   Allergic rhinitis with postnasal drip    Using nasal corticosteroid; avoid decongestants      Relevant Medications   fluticasone (FLONASE) 50 MCG/ACT nasal spray     Digestive   GERD without esophagitis    Improved, limit triggers        Endocrine   Type 2 diabetes mellitus ([employer])    Foot  exam by MD; eye exam UTD; work on healthy eating, weight management, activity  Relevant Orders   COMPLETE METABOLIC PANEL WITH GFR   Hemoglobin A1c   Lipid panel     Nervous and Auditory   Hearing loss    Refer to ENT      Relevant Orders   Ambulatory referral to ENT     Other   Hyperlipidemia LDL goal <100    Try to limit sat fats; check labs today      History of renal calculi    None recently; hydrate          Follow up plan: Return in about 6 months (around 09/10/2017) for twenty minute follow-up with fasting labs.  An after-visit summary was printed and given to the patient at [facility].  Please see the patient instructions which may contain other information and recommendations beyond what is mentioned above in the assessment and plan.  Meds ordered this encounter  Medications  . fluticasone (FLONASE) 50 MCG/ACT nasal spray    Sig: Place 2 sprays into both nostrils daily.    Dispense:  48 g    Refill:  3    Orders Placed This Encounter  Procedures  . COMPLETE METABOLIC PANEL WITH GFR  . Hemoglobin A1c  . Lipid panel  . Ambulatory referral to ENT

## 2017-03-11 NOTE — Patient Instructions (Addendum)
Try to use PLAIN allergy medicine without the decongestant Avoid: phenylephrine, phenylpropanolamine, and pseudoephredine Your goal blood pressure is less than 130 mmHg on top. Try to follow the DASH guidelines (DASH stands for Dietary Approaches to Stop Hypertension) Try to limit the sodium in your diet.  Ideally, consume less than 1.5 grams (less than 1,500mg ) per day. Do not add salt when cooking or at the table.  Check the sodium amount on labels when shopping, and choose items lower in sodium when given a choice. Avoid or limit foods that already contain a lot of sodium. Eat a diet rich in fruits and vegetables and whole grains. We'll have you see the Ear Nose Throat doctor We'll get labs today

## 2017-03-11 NOTE — Assessment & Plan Note (Signed)
Foot exam by MD; eye exam UTD; work on healthy eating, weight management, activity

## 2017-03-11 NOTE — Assessment & Plan Note (Signed)
Refer to ENT

## 2017-03-11 NOTE — Assessment & Plan Note (Signed)
Improved, limit triggers

## 2017-03-11 NOTE — Assessment & Plan Note (Signed)
Try to limit sat fats; check labs today

## 2017-03-12 LAB — HEMOGLOBIN A1C
Hgb A1c MFr Bld: 6.2 % — ABNORMAL HIGH (ref <5.7)
Mean Plasma Glucose: 131 mg/dL

## 2017-04-05 ENCOUNTER — Other Ambulatory Visit: Payer: Self-pay | Admitting: Otolaryngology

## 2017-04-05 DIAGNOSIS — H903 Sensorineural hearing loss, bilateral: Secondary | ICD-10-CM

## 2017-04-05 DIAGNOSIS — H9041 Sensorineural hearing loss, unilateral, right ear, with unrestricted hearing on the contralateral side: Secondary | ICD-10-CM | POA: Diagnosis not present

## 2017-04-18 ENCOUNTER — Ambulatory Visit
Admission: RE | Admit: 2017-04-18 | Discharge: 2017-04-18 | Disposition: A | Discharge status: Home / Self Care | Payer: 59 | Source: Ambulatory Visit | Attending: Otolaryngology | Admitting: Otolaryngology

## 2017-04-18 DIAGNOSIS — H903 Sensorineural hearing loss, bilateral: Secondary | ICD-10-CM | POA: Diagnosis not present

## 2017-04-18 DIAGNOSIS — H9191 Unspecified hearing loss, right ear: Secondary | ICD-10-CM | POA: Diagnosis not present

## 2017-04-18 MED ORDER — GADOBENATE DIMEGLUMINE 529 MG/ML IV SOLN
20.0000 mL | Freq: Once | INTRAVENOUS | Status: AC | PRN
  Administered 2017-04-18: 20 mL via INTRAVENOUS

## 2017-05-18 ENCOUNTER — Other Ambulatory Visit: Payer: Self-pay | Admitting: Family Medicine

## 2017-06-21 ENCOUNTER — Other Ambulatory Visit: Payer: Self-pay | Admitting: Family Medicine

## 2017-09-08 ENCOUNTER — Ambulatory Visit (INDEPENDENT_AMBULATORY_CARE_PROVIDER_SITE_OTHER): Payer: 59 | Admitting: Family Medicine

## 2017-09-08 ENCOUNTER — Encounter: Payer: Self-pay | Admitting: Family Medicine

## 2017-09-08 VITALS — BP 122/80 | HR 97 | Temp 98.1°F | Resp 16 | Wt 238.3 lb

## 2017-09-08 DIAGNOSIS — E785 Hyperlipidemia, unspecified: Secondary | ICD-10-CM

## 2017-09-08 DIAGNOSIS — Z5181 Encounter for therapeutic drug level monitoring: Secondary | ICD-10-CM | POA: Diagnosis not present

## 2017-09-08 DIAGNOSIS — Z87442 Personal history of urinary calculi: Secondary | ICD-10-CM | POA: Diagnosis not present

## 2017-09-08 DIAGNOSIS — I10 Essential (primary) hypertension: Secondary | ICD-10-CM | POA: Diagnosis not present

## 2017-09-08 DIAGNOSIS — Z23 Encounter for immunization: Secondary | ICD-10-CM | POA: Diagnosis not present

## 2017-09-08 DIAGNOSIS — Z6835 Body mass index (BMI) 35.0-35.9, adult: Secondary | ICD-10-CM | POA: Insufficient documentation

## 2017-09-08 DIAGNOSIS — Z9989 Dependence on other enabling machines and devices: Secondary | ICD-10-CM | POA: Diagnosis not present

## 2017-09-08 DIAGNOSIS — K219 Gastro-esophageal reflux disease without esophagitis: Secondary | ICD-10-CM | POA: Diagnosis not present

## 2017-09-08 DIAGNOSIS — E119 Type 2 diabetes mellitus without complications: Secondary | ICD-10-CM | POA: Diagnosis not present

## 2017-09-08 DIAGNOSIS — G4733 Obstructive sleep apnea (adult) (pediatric): Secondary | ICD-10-CM

## 2017-09-08 HISTORY — DX: Morbid (severe) obesity due to excess calories: E66.01

## 2017-09-08 NOTE — Addendum Note (Signed)
Addended by: LADA, Satira Anis on: 09/08/2017 08:32 AM   Modules accepted: Orders

## 2017-09-08 NOTE — Assessment & Plan Note (Signed)
Great control, glad he's limiting salt

## 2017-09-08 NOTE — Assessment & Plan Note (Signed)
hydrate

## 2017-09-08 NOTE — Assessment & Plan Note (Signed)
Doing well 

## 2017-09-08 NOTE — Progress Notes (Addendum)
BP 122/80   Pulse 97   Temp 98.1 F (36.7 C) (Oral)   Resp 16   Wt 238 lb 4.8 oz (108.1 kg)   SpO2 95%   BMI 35.19 kg/m    Subjective:    Patient ID: Wayne Flowers, male    DOB: August 22, 1976, 41 y.o.   MRN: [id number]  HPI: Wayne Flowers is a 41 y.o. male  Chief Complaint  Patient presents with  . Follow-up   HPI Patient is here for f/u  Hypertension; well-controlled; good at eye doctor yesterday; not adding salt to food  Type 2 diabetes mellitus; did not tolerate metformin; doing well on Jardiance; not checking FSBS with my blessing; no dry mouth; no problems with feet; last eye exam was yesterday, 20/20, no glaucoma, no problems at all, dilated exam  High cholesterol; not as much red meat; still some country ham; more chicken than hamburgers Lab Results  Component Value Date   CHOL 141 03/11/2017   HDL 41 03/11/2017   LDLCALC 82 03/11/2017   TRIG 89 03/11/2017   CHOLHDL 3.4 03/11/2017   GERD; doing okay on the H2 blocker  ED; medicine is working  Allergic rhinitis; taking flonase, working well  Obesity; going to the gym; not drinking tons of water, but tries to stay hydrated; drinking plenty of fluids; out to eat some  Hearing loss, hereditary, kicks in after age 47; not related to work; got an MRI; negative for acoustic neuroma; has hearing aide, helps; grandfather had hearing loss; brother has hearing aide, but had some amniotic fluid issue at birth  OSA; not using CPAP; not stopping breathing in his sleep  Hx of kidney stones; trying to hydrate; drinks diet ginger ale  Depression screen [facility] 2/9 09/08/2017 03/11/2017 09/13/2016 06/08/2016 12/22/2015  Decreased Interest 0 0 0 0 0  Down, Depressed, Hopeless 0 0 0 0 0  PHQ - 2 Score 0 0 0 0 0    Relevant past medical, surgical, family and social history reviewed Past Medical History:  Diagnosis Date  . Allergy   . Atelectasis   . Chronic kidney disease    KIDNEY STONE  . ED (erectile dysfunction)   .  ED (erectile dysfunction)   . GERD (gastroesophageal reflux disease)   . Hearing loss    right  . Hypertension   . Metabolic syndrome   . Morbid obesity ([place]) 09/08/2017   BMI >35 plus diabetes  . Obesity, Class I, BMI 30.0-34.9 (see actual BMI)   . Pleurisy   . PONV (postoperative nausea and vomiting)   . Sleep apnea    HAS NOT USED CPAP IN 6 MONTHS  . Type 2 diabetes mellitus ([place]) 06/23/2016   Past Surgical History:  Procedure Laterality Date  . CYSTOSCOPY W/ RETROGRADES Left 09/16/2015   Procedure: CYSTOSCOPY WITH RETROGRADE PYELOGRAM;  Surgeon: Royston Cowper, MD;  Location: [facility] ORS;  Service: Urology;  Laterality: Left;  . CYSTOSCOPY WITH URETEROSCOPY AND STENT PLACEMENT    . URETEROSCOPY WITH HOLMIUM LASER LITHOTRIPSY Left 09/16/2015   Procedure: URETEROSCOPY WITH HOLMIUM LASER LITHOTRIPSY;  Surgeon: Royston Cowper, MD;  Location: [facility] ORS;  Service: Urology;  Laterality: Left;  Marland Kitchen VASECTOMY     Family History  Problem Relation Age of Onset  . Diabetes Mother   . Cancer Father        renal  . Spina bifida Sister   . Appendicitis Son   . Cancer Maternal Grandfather        black  lung  . Cancer Paternal Grandmother        lung  . Diabetes Sister    Social History   Social History  . Marital status: Married    Spouse name: N/A  . Number of children: N/A  . Years of education: N/A   Occupational History  . Not on file.   Social History Main Topics  . Smoking status: Never Smoker  . Smokeless tobacco: Never Used  . Alcohol use 0.0 - 0.6 oz/week  . Drug use: No  . Sexual activity: Yes    Partners: Female   Other Topics Concern  . Not on file   Social History Narrative  . No narrative on file    Interim medical history since last visit reviewed. Allergies and medications reviewed  Review of Systems Per HPI unless specifically indicated above     Objective:    BP 122/80   Pulse 97   Temp 98.1 F (36.7 C) (Oral)   Resp 16   Wt 238 lb 4.8 oz  (108.1 kg)   SpO2 95%   BMI 35.19 kg/m   Wt Readings from Last 3 Encounters:  09/08/17 238 lb 4.8 oz (108.1 kg)  03/11/17 239 lb (108.4 kg)  09/13/16 233 lb (105.7 kg)    Physical Exam  Constitutional: He appears well-developed and well-nourished. No distress.  HENT:  Head: Normocephalic and atraumatic.  Right Ear: External ear normal.  Left Ear: External ear normal.  Mouth/Throat: Oropharynx is clear and moist.  Eyes: No scleral icterus.  Cardiovascular: Normal rate and regular rhythm.   Pulmonary/Chest: Effort normal and breath sounds normal.  Abdominal: He exhibits no distension.  Musculoskeletal: He exhibits no edema.  Neurological: He is alert.  Skin: Skin is warm.  Psychiatric: He has a normal mood and affect. His behavior is normal. His mood appears not anxious. He does not exhibit a depressed mood.   Diabetic Foot Form - Detailed   Diabetic Foot Exam - detailed Diabetic Foot exam was performed with the following findings:  Yes 09/08/2017  8:27 AM  Visual Foot Exam completed.:  Yes  Pulse Foot Exam completed.:  Yes  Right Dorsalis Pedis:  Present Left Dorsalis Pedis:  Present  Sensory Foot Exam Completed.:  Yes Semmes-Weinstein Monofilament Test R Site 1-Great Toe:  Pos L Site 1-Great Toe:  Pos        Results for orders placed or performed in visit on 03/11/17  COMPLETE METABOLIC PANEL WITH GFR  Result Value Ref Range   Sodium 140 135 - 146 mmol/L   Potassium 4.7 3.5 - 5.3 mmol/L   Chloride 105 98 - 110 mmol/L   CO2 26 20 - 31 mmol/L   Glucose, Bld 110 (H) 65 - 99 mg/dL   BUN 17 7 - 25 mg/dL   Creat 0.97 0.60 - 1.35 mg/dL   Total Bilirubin 0.6 0.2 - 1.2 mg/dL   Alkaline Phosphatase 57 40 - 115 U/L   AST 19 10 - 40 U/L   ALT 32 9 - 46 U/L   Total Protein 6.7 6.1 - 8.1 g/dL   Albumin 4.3 3.6 - 5.1 g/dL   Calcium 9.1 8.6 - 10.3 mg/dL   GFR, Est African American >89 >=60 mL/min   GFR, Est Non African American >89 >=60 mL/min  Hemoglobin A1c  Result Value  Ref Range   Hgb A1c MFr Bld 6.2 (H) <5.7 %   Mean Plasma Glucose 131 mg/dL  Lipid panel  Result Value Ref Range  Cholesterol 141 <200 mg/dL   Triglycerides 89 <150 mg/dL   HDL 41 >40 mg/dL   Total CHOL/HDL Ratio 3.4 <5.0 Ratio   VLDL 18 <30 mg/dL   LDL Cholesterol 82 <100 mg/dL      Assessment & Plan:   Problem List Items Addressed This Visit      Cardiovascular and Mediastinum   Hypertension goal BP (blood pressure) < 140/90    Great control, glad he's limiting salt        Respiratory   Obstructive sleep apnea treated with continuous positive airway pressure (CPAP)    Working on weight loss; encouraged use of CPAP        Digestive   GERD without esophagitis    Doing well        Endocrine   Type 2 diabetes mellitus (HCC) - Primary    Check a1c; food exam by MD today      Relevant Orders   Microalbumin / creatinine urine ratio   Hemoglobin A1c     Other   Needs flu shot   Relevant Orders   Flu Vaccine QUAD 6+ mos PF IM (Fluarix Quad PF) (Completed)   Morbid obesity (HCC)    BMI > 35 plus diabetes; urged weight loss; see AVS; won't take much to get BMI under 35; next target will be 202 pounds to get out of "obese" category      Medication monitoring encounter    Watch kidneys, check next week      Relevant Orders   COMPLETE METABOLIC PANEL WITH GFR   Hyperlipidemia LDL goal <100    Check lipids; avoid saturated fats      Relevant Orders   Lipid panel   History of renal calculi    hydrate          Follow up plan: Return in about 6 months (around 03/23/2018) for twenty minute follow-up with fasting labs.  An after-visit summary was printed and given to the patient at [facility].  Please see the patient instructions which may contain other information and recommendations beyond what is mentioned above in the assessment and plan.  No orders of the defined types were placed in this encounter.   Orders Placed This Encounter  Procedures  . Flu  Vaccine QUAD 6+ mos PF IM (Fluarix Quad PF)  . Microalbumin / creatinine urine ratio  . Lipid panel  . Hemoglobin A1c  . COMPLETE METABOLIC PANEL WITH GFR

## 2017-09-08 NOTE — Assessment & Plan Note (Signed)
Watch kidneys, check next week

## 2017-09-08 NOTE — Patient Instructions (Addendum)
Check out the information at familydoctor.org entitled "Nutrition for Weight Loss: What You Need to Know about Fad Diets" Try to lose between 1-2 pounds per week by taking in fewer calories and burning off more calories You can succeed by limiting portions, limiting foods dense in calories and fat, becoming more active, and drinking 8 glasses of water a day (64 ounces) Don't skip meals, especially breakfast, as skipping meals may alter your metabolism Do not use over-the-counter weight loss pills or gimmicks that claim rapid weight loss A healthy BMI (or body mass index) is between 18.5 and 24.9 You can calculate your ideal BMI at the [place] website ClubMonetize.fr  Return next week for fasting labs Shoot for 202 pounds over the next 6 months You received the flu shot today; it should protect you against the flu virus over the coming months; it will take about two weeks for antibodies to develop; do try to stay away from hospitals, nursing homes, and daycares during peak flu season; taking extra vitamin C daily during flu season may help you avoid getting sick

## 2017-09-08 NOTE — Assessment & Plan Note (Signed)
Check a1c; food exam by MD today

## 2017-09-08 NOTE — Assessment & Plan Note (Signed)
Check lipids; avoid saturated fats

## 2017-09-08 NOTE — Assessment & Plan Note (Addendum)
BMI > 35 plus diabetes; urged weight loss; see AVS; won't take much to get BMI under 35; next target will be 202 pounds to get out of "obese" category

## 2017-09-08 NOTE — Assessment & Plan Note (Signed)
Working on weight loss; encouraged use of CPAP

## 2017-11-03 ENCOUNTER — Other Ambulatory Visit: Payer: Self-pay | Admitting: Family Medicine

## 2017-11-03 NOTE — Telephone Encounter (Signed)
Left detailed voicemail

## 2017-11-03 NOTE — Telephone Encounter (Signed)
Please ask pt to get the labs done that were ordered in October; thank you

## 2017-11-15 ENCOUNTER — Other Ambulatory Visit: Payer: Self-pay

## 2017-11-15 DIAGNOSIS — Z5181 Encounter for therapeutic drug level monitoring: Secondary | ICD-10-CM

## 2017-11-15 DIAGNOSIS — E785 Hyperlipidemia, unspecified: Secondary | ICD-10-CM

## 2017-11-15 DIAGNOSIS — E119 Type 2 diabetes mellitus without complications: Secondary | ICD-10-CM | POA: Diagnosis not present

## 2017-11-16 ENCOUNTER — Other Ambulatory Visit: Payer: Self-pay | Admitting: Family Medicine

## 2017-11-16 LAB — COMPLETE METABOLIC PANEL WITH GFR
AG Ratio: 1.8 (calc) (ref 1.0–2.5)
ALT: 35 U/L (ref 9–46)
AST: 20 U/L (ref 10–40)
Albumin: 4.5 g/dL (ref 3.6–5.1)
Alkaline phosphatase (APISO): 62 U/L (ref 40–115)
BUN: 16 mg/dL (ref 7–25)
CO2: 27 mmol/L (ref 20–32)
Calcium: 9.5 mg/dL (ref 8.6–10.3)
Chloride: 101 mmol/L (ref 98–110)
Creat: 0.9 mg/dL (ref 0.60–1.35)
GFR, Est African American: 121 mL/min/{1.73_m2} (ref >=60)
GFR, Est Non African American: 104 mL/min/{1.73_m2} (ref >=60)
Globulin: 2.5 g/dL (calc) (ref 1.9–3.7)
Glucose, Bld: 114 mg/dL — ABNORMAL HIGH (ref 65–99)
Potassium: 4.8 mmol/L (ref 3.5–5.3)
Sodium: 138 mmol/L (ref 135–146)
Total Bilirubin: 0.5 mg/dL (ref 0.2–1.2)
Total Protein: 7 g/dL (ref 6.1–8.1)

## 2017-11-16 LAB — LIPID PANEL
Cholesterol: 185 mg/dL (ref <200)
HDL: 49 mg/dL (ref >40)
LDL Cholesterol (Calc): 112 mg/dL (calc) — ABNORMAL HIGH
Non-HDL Cholesterol (Calc): 136 mg/dL (calc) — ABNORMAL HIGH (ref <130)
Total CHOL/HDL Ratio: 3.8 (calc) (ref <5.0)
Triglycerides: 125 mg/dL (ref <150)

## 2017-11-16 LAB — HEMOGLOBIN A1C
Hgb A1c MFr Bld: 6.8 % of total Hgb — ABNORMAL HIGH (ref <5.7)
Mean Plasma Glucose: 148 (calc)
eAG (mmol/L): 8.2 (calc)

## 2017-11-16 LAB — MICROALBUMIN / CREATININE URINE RATIO
Creatinine, Urine: 76 mg/dL (ref 20–320)
Microalb Creat Ratio: 5 mcg/mg creat (ref <30)
Microalb, Ur: 0.4 mg/dL

## 2017-11-16 MED ORDER — ATORVASTATIN CALCIUM 40 MG PO TABS: 40.0000 mg | ORAL_TABLET | Freq: Every day | ORAL | 1 refills | Status: DC

## 2017-11-16 NOTE — Progress Notes (Signed)
Increase the statin

## 2017-12-05 ENCOUNTER — Other Ambulatory Visit: Payer: Self-pay | Admitting: Family Medicine

## 2017-12-05 NOTE — Telephone Encounter (Signed)
rxs approved 

## 2018-01-23 ENCOUNTER — Other Ambulatory Visit: Payer: Self-pay | Admitting: Family Medicine

## 2018-02-28 ENCOUNTER — Ambulatory Visit: Payer: 59 | Admitting: Family Medicine

## 2018-02-28 ENCOUNTER — Encounter: Payer: Self-pay | Admitting: Family Medicine

## 2018-02-28 DIAGNOSIS — E785 Hyperlipidemia, unspecified: Secondary | ICD-10-CM | POA: Diagnosis not present

## 2018-02-28 DIAGNOSIS — E119 Type 2 diabetes mellitus without complications: Secondary | ICD-10-CM

## 2018-02-28 DIAGNOSIS — I10 Essential (primary) hypertension: Secondary | ICD-10-CM

## 2018-02-28 DIAGNOSIS — K219 Gastro-esophageal reflux disease without esophagitis: Secondary | ICD-10-CM | POA: Diagnosis not present

## 2018-02-28 MED ORDER — DULAGLUTIDE 0.75 MG/0.5ML ~~LOC~~ SOAJ: SUBCUTANEOUS | 5 refills | Status: DC

## 2018-02-28 NOTE — Assessment & Plan Note (Signed)
He'll work on weight loss

## 2018-02-28 NOTE — Patient Instructions (Addendum)
Check out the information at familydoctor.org entitled "Nutrition for Weight Loss: What You Need to Know about Fad Diets" Try to lose between 1-2 pounds per week by taking in fewer calories and burning off more calories You can succeed by limiting portions, limiting foods dense in calories and fat, becoming more active, and drinking 8 glasses of water a day (64 ounces) Don't skip meals, especially breakfast, as skipping meals may alter your metabolism Do not use over-the-counter weight loss pills or gimmicks that claim rapid weight loss A healthy BMI (or body mass index) is between 18.5 and 24.9 You can calculate your ideal BMI at the [place] website ClubMonetize.fr Stop the [facility] the Trulicity; your pharmacist can provide teaching We can increase from 0.75 to 1.5 if needed if sugars aren't under 150 and you are not losing weight after one to two months

## 2018-02-28 NOTE — Assessment & Plan Note (Signed)
He is back on statin; work on weight loss and healthier eating

## 2018-02-28 NOTE — Assessment & Plan Note (Signed)
Likely influenced by his obesity; continue H2 blocker

## 2018-02-28 NOTE — Progress Notes (Signed)
BP 132/84   Pulse 87   Temp 98.1 F (36.7 C) (Oral)   Resp 14   Ht 5\' 9"  (1.753 m)   Wt 240 lb 3.2 oz (109 kg)   SpO2 95%   BMI 35.47 kg/m    Subjective:    Patient ID: Minnie Shi, male    DOB: 21-Apr-1976, 42 y.o.   MRN: [id number]  HPI: Asiah Befort is a 42 y.o. male  Chief Complaint  Patient presents with  . Follow-up    HPI Patient is here for follow-up Type 2 diabetes; no blurred vision or dry mouth; no probs with feet A1c has climbed 6.1 to 6.2 to 6.8 Metformin caused symptoms; diarrhea; just on jardiance Urine was normal, microalb:Cr No MTC, no MEN-2, no pancreatitis High cholesterol; taking lipitor, was forgetting for a few weeks; was coming down before, just out of the habit Obesity; yo-yo; ideal weight would be 190 to 200 pounds; has gotten down to 220 pounds, but it's the eating; he does really good at going to the gym, then something comes up at work, messes the schedule, then the eating comes back; tends to eat better when working out Hx of kidney stones; trying to stay hydrated No heart burn while on ranitidine; controlling symptoms  Depression screen [facility] 2/9 02/28/2018 09/08/2017 03/11/2017 09/13/2016 06/08/2016  Decreased Interest 0 0 0 0 0  Down, Depressed, Hopeless 0 0 0 0 0  PHQ - 2 Score 0 0 0 0 0    Relevant past medical, surgical, family and social history reviewed Past Medical History:  Diagnosis Date  . Allergy   . Atelectasis   . Chronic kidney disease    KIDNEY STONE  . ED (erectile dysfunction)   . ED (erectile dysfunction)   . GERD (gastroesophageal reflux disease)   . Hearing loss    right  . Hypertension   . Metabolic syndrome   . Morbid obesity ([place]) 09/08/2017   BMI >35 plus diabetes  . Obesity, Class I, BMI 30.0-34.9 (see actual BMI)   . Pleurisy   . PONV (postoperative nausea and vomiting)   . Sleep apnea    HAS NOT USED CPAP IN 6 MONTHS  . Type 2 diabetes mellitus ([place]) 06/23/2016   Past Surgical History:    Procedure Laterality Date  . CYSTOSCOPY W/ RETROGRADES Left 09/16/2015   Procedure: CYSTOSCOPY WITH RETROGRADE PYELOGRAM;  Surgeon: Royston Cowper, MD;  Location: [facility] ORS;  Service: Urology;  Laterality: Left;  . CYSTOSCOPY WITH URETEROSCOPY AND STENT PLACEMENT    . URETEROSCOPY WITH HOLMIUM LASER LITHOTRIPSY Left 09/16/2015   Procedure: URETEROSCOPY WITH HOLMIUM LASER LITHOTRIPSY;  Surgeon: Royston Cowper, MD;  Location: [facility] ORS;  Service: Urology;  Laterality: Left;  Marland Kitchen VASECTOMY     Family History  Problem Relation Age of Onset  . Diabetes Mother   . Cancer Father        renal  . Spina bifida Sister   . Appendicitis Son   . Cancer Maternal Grandfather        black lung  . Cancer Paternal Grandmother        lung  . Diabetes Sister    Social History   Tobacco Use  . Smoking status: Never Smoker  . Smokeless tobacco: Never Used  Substance Use Topics  . Alcohol use: Yes    Alcohol/week: 0.0 - 0.6 oz  . Drug use: No    Interim medical history since last visit reviewed. Allergies and medications reviewed  Review of Systems Per HPI unless specifically indicated above     Objective:    BP 132/84   Pulse 87   Temp 98.1 F (36.7 C) (Oral)   Resp 14   Ht 5\' 9"  (1.753 m)   Wt 240 lb 3.2 oz (109 kg)   SpO2 95%   BMI 35.47 kg/m   Wt Readings from Last 3 Encounters:  02/28/18 240 lb 3.2 oz (109 kg)  09/08/17 238 lb 4.8 oz (108.1 kg)  03/11/17 239 lb (108.4 kg)    Physical Exam  Constitutional: He appears well-developed and well-nourished. No distress.  HENT:  Head: Normocephalic and atraumatic.  Eyes: EOM are normal. No scleral icterus.  Neck: No thyromegaly present.  Cardiovascular: Normal rate and regular rhythm.  Pulmonary/Chest: Effort normal and breath sounds normal.  Abdominal: Soft. He exhibits no distension.  Musculoskeletal: He exhibits no edema.  Neurological: Coordination normal.  Skin: Skin is warm and dry. No pallor.  Psychiatric: He has a  normal mood and affect. His behavior is normal. Judgment and thought content normal.   Diabetic Foot Form - Detailed   Diabetic Foot Exam - detailed Diabetic Foot exam was performed with the following findings:  Yes 02/28/2018  8:55 AM  Visual Foot Exam completed.:  Yes  Pulse Foot Exam completed.:  Yes  Right Dorsalis Pedis:  Present Left Dorsalis Pedis:  Present  Sensory Foot Exam Completed.:  Yes Semmes-Weinstein Monofilament Test R Site 1-Great Toe:  Pos L Site 1-Great Toe:  Pos        Results for orders placed or performed in visit on 11/15/17  Hemoglobin A1c  Result Value Ref Range   Hgb A1c MFr Bld 6.8 (H) <5.7 % of total Hgb   Mean Plasma Glucose 148 (calc)   eAG (mmol/L) 8.2 (calc)  Lipid panel  Result Value Ref Range   Cholesterol 185 <200 mg/dL   HDL 49 >40 mg/dL   Triglycerides 125 <150 mg/dL   LDL Cholesterol (Calc) 112 (H) mg/dL (calc)   Total CHOL/HDL Ratio 3.8 <5.0 (calc)   Non-HDL Cholesterol (Calc) 136 (H) <130 mg/dL (calc)  Microalbumin / creatinine urine ratio  Result Value Ref Range   Creatinine, Urine 76 20 - 320 mg/dL   Microalb, Ur 0.4 mg/dL   Microalb Creat Ratio 5 <30 mcg/mg creat  COMPLETE METABOLIC PANEL WITH GFR  Result Value Ref Range   Glucose, Bld 114 (H) 65 - 99 mg/dL   BUN 16 7 - 25 mg/dL   Creat 0.90 0.60 - 1.35 mg/dL   GFR, Est Non African American 104 > OR = 60 mL/min/1.48m2   GFR, Est African American 121 > OR = 60 mL/min/1.65m2   BUN/Creatinine Ratio NOT APPLICABLE 6 - 22 (calc)   Sodium 138 135 - 146 mmol/L   Potassium 4.8 3.5 - 5.3 mmol/L   Chloride 101 98 - 110 mmol/L   CO2 27 20 - 32 mmol/L   Calcium 9.5 8.6 - 10.3 mg/dL   Total Protein 7.0 6.1 - 8.1 g/dL   Albumin 4.5 3.6 - 5.1 g/dL   Globulin 2.5 1.9 - 3.7 g/dL (calc)   AG Ratio 1.8 1.0 - 2.5 (calc)   Total Bilirubin 0.5 0.2 - 1.2 mg/dL   Alkaline phosphatase (APISO) 62 40 - 115 U/L   AST 20 10 - 40 U/L   ALT 35 9 - 46 U/L      Assessment & Plan:   Problem List  Items Addressed This Visit  Cardiovascular and Mediastinum   Hypertension goal BP (blood pressure) < 140/90    He'll work on weight loss        Digestive   GERD without esophagitis    Likely influenced by his obesity; continue H2 blocker        Endocrine   Type 2 diabetes mellitus ([place])    Stop jardiance and add Trulicity; he'll try to watch his diet better; foot exam by MD      Relevant Medications   Dulaglutide (TRULICITY) 3.15 VV/6.1YW SOPN     Other   Morbid obesity (HCC)    BMI > 35 plus diabetes, high cholesterol, GERD, OSA; will switch to GLP-1 in hopes of helping with weight loss; see AVS      Relevant Medications   Dulaglutide (TRULICITY) 7.37 TG/6.2IR SOPN   Hyperlipidemia LDL goal <100    He is back on statin; work on weight loss and healthier eating          Follow up plan: Return in about 3 months (around 05/17/2018) for follow-up visit with Dr. Sanda Klein (or just after).  An after-visit summary was printed and given to the patient at [place].  Please see the patient instructions which may contain other information and recommendations beyond what is mentioned above in the assessment and plan.  Meds ordered this encounter  Medications  . Dulaglutide (TRULICITY) 4.85 IO/2.7OJ SOPN    Sig: Inject into the skin once a week    Dispense:  4 pen    Refill:  5    No orders of the defined types were placed in this encounter.

## 2018-02-28 NOTE — Assessment & Plan Note (Signed)
BMI > 35 plus diabetes, high cholesterol, GERD, OSA; will switch to GLP-1 in hopes of helping with weight loss; see AVS

## 2018-02-28 NOTE — Assessment & Plan Note (Signed)
Stop jardiance and add Trulicity; he'll try to watch his diet better; foot exam by MD

## 2018-05-12 ENCOUNTER — Other Ambulatory Visit: Payer: Self-pay | Admitting: Family Medicine

## 2018-05-12 NOTE — Telephone Encounter (Signed)
rx sent; appt next week Last sgpt and lipids reviewed

## 2018-05-17 ENCOUNTER — Ambulatory Visit: Payer: 59 | Admitting: Family Medicine

## 2018-05-17 ENCOUNTER — Encounter: Payer: Self-pay | Admitting: Family Medicine

## 2018-05-17 VITALS — BP 122/72 | HR 84 | Temp 98.2°F | Resp 12 | Ht 69.0 in | Wt 241.4 lb

## 2018-05-17 DIAGNOSIS — I10 Essential (primary) hypertension: Secondary | ICD-10-CM | POA: Diagnosis not present

## 2018-05-17 DIAGNOSIS — E119 Type 2 diabetes mellitus without complications: Secondary | ICD-10-CM

## 2018-05-17 DIAGNOSIS — E785 Hyperlipidemia, unspecified: Secondary | ICD-10-CM

## 2018-05-17 MED ORDER — DULAGLUTIDE 1.5 MG/0.5ML ~~LOC~~ SOAJ: SUBCUTANEOUS | 11 refills | Status: DC

## 2018-05-17 NOTE — Progress Notes (Signed)
BP 122/72   Pulse 84   Temp 98.2 F (36.8 C) (Oral)   Resp 12   Ht 5\' 9"  (1.753 m)   Wt 241 lb 6.4 oz (109.5 kg)   SpO2 93%   BMI 35.65 kg/m    Subjective:    Patient ID: Wayne Flowers, male    DOB: [birth year], 42 y.o.   MRN: [id number]  HPI: Wayne Flowers is a 42 y.o. male  Chief Complaint  Patient presents with  . Follow-up    HPI Patient is here for follow-up  Type 2 diabetes; not checking sugars with my blessing; not needed; did well at first on trulicity; eye exam UTD; no probs with feet; runs in the family (mother and sister, weight-related and lost weight and now not on meds) Lab Results  Component Value Date   HGBA1C 6.8 (H) 11/15/2017   Hyperlipidemia; on statin; eats some cheese, some fatty meats; maybe not enough fiber Lab Results  Component Value Date   CHOL 185 11/15/2017   HDL 49 11/15/2017   LDLCALC 112 (H) 11/15/2017   TRIG 125 11/15/2017   CHOLHDL 3.8 11/15/2017   High blood pressure; well-controlled; doesn't add salt to his food  Obesity, morbid (BMI >35 with comorbidities) Weight got down to 233 pounds after starting the Trulicity, then gained back up; felt full and would eat less, then an hour later; drinking lots of decaf  Heartburn controlled with H2 blocker  Depression screen [facility] 2/9 05/17/2018 02/28/2018 09/08/2017 03/11/2017 09/13/2016  Decreased Interest 0 0 0 0 0  Down, Depressed, Hopeless 0 0 0 0 0  PHQ - 2 Score 0 0 0 0 0    Relevant past medical, surgical, family and social history reviewed Past Medical History:  Diagnosis Date  . Allergy   . Atelectasis   . Chronic kidney disease    KIDNEY STONE  . ED (erectile dysfunction)   . ED (erectile dysfunction)   . GERD (gastroesophageal reflux disease)   . Hearing loss    right  . Hypertension   . Metabolic syndrome   . Morbid obesity ([place]) 09/08/2017   BMI >35 plus diabetes  . Obesity, Class I, BMI 30.0-34.9 (see actual BMI)   . Pleurisy   . PONV (postoperative nausea  and vomiting)   . Sleep apnea    HAS NOT USED CPAP IN 6 MONTHS  . Type 2 diabetes mellitus ([place]) 06/23/2016   Past Surgical History:  Procedure Laterality Date  . CYSTOSCOPY W/ RETROGRADES Left 09/16/2015   Procedure: CYSTOSCOPY WITH RETROGRADE PYELOGRAM;  Surgeon: Royston Cowper, MD;  Location: [facility] ORS;  Service: Urology;  Laterality: Left;  . CYSTOSCOPY WITH URETEROSCOPY AND STENT PLACEMENT    . URETEROSCOPY WITH HOLMIUM LASER LITHOTRIPSY Left 09/16/2015   Procedure: URETEROSCOPY WITH HOLMIUM LASER LITHOTRIPSY;  Surgeon: Royston Cowper, MD;  Location: [facility] OR[facility];  Service: Urology;  Laterality: Left;  Marland Kitchen VASECTOMY     Family History  Problem Relation Age of Onset  . Diabetes Mother   . Cancer Father        renal  . Spina bifida Sister   . Appendicitis Son   . Cancer Maternal Grandfather        black lung  . Cancer Paternal Grandmother        lung  . Diabetes Sister    Social History   Tobacco Use  . Smoking status: Never Smoker  . Smokeless tobacco: Never Used  Substance Use Topics  . Alcohol use:  Yes    Alcohol/week: 0.0 - 0.6 oz    Comment: rarley  . Drug use: No    Interim medical history since last visit reviewed. Allergies and medications reviewed  Review of Systems Per HPI unless specifically indicated above     Objective:    BP 122/72   Pulse 84   Temp 98.2 F (36.8 C) (Oral)   Resp 12   Ht 5\' 9"  (1.753 m)   Wt 241 lb 6.4 oz (109.5 kg)   SpO2 93%   BMI 35.65 kg/m   Wt Readings from Last 3 Encounters:  05/17/18 241 lb 6.4 oz (109.5 kg)  02/28/18 240 lb 3.2 oz (109 kg)  09/08/17 238 lb 4.8 oz (108.1 kg)    Physical Exam  Constitutional: He appears well-developed and well-nourished. No distress.  HENT:  Head: Normocephalic and atraumatic.  Eyes: EOM are normal. No scleral icterus.  Neck: No thyromegaly present.  Cardiovascular: Normal rate and regular rhythm.  Pulmonary/Chest: Effort normal and breath sounds normal.  Abdominal: Soft. Bowel  sounds are normal. He exhibits no distension. There is no tenderness.  Musculoskeletal: He exhibits no edema.  Feet:  Right Foot:  Protective Sensation: 5 sites tested. 5 sites sensed.  Left Foot:  Protective Sensation: 5 sites tested. 5 sites sensed.  Neurological: Coordination normal.  Skin: Skin is warm and dry. No pallor.  Psychiatric: He has a normal mood and affect. His behavior is normal. Judgment and thought content normal.   Diabetic Foot Form - Detailed   Diabetic Foot Exam - detailed Diabetic Foot exam was performed with the following findings:  Yes 05/17/2018  9:20 AM  Visual Foot Exam completed.:  Yes  Pulse Foot Exam completed.:  Yes  Right Dorsalis Pedis:  Present Left Dorsalis Pedis:  Present  Sensory Foot Exam Completed.:  Yes Semmes-Weinstein Monofilament Test R Site 1-Great Toe:  Pos L Site 1-Great Toe:  Pos         Results for orders placed or performed in visit on 11/15/17  Hemoglobin A1c  Result Value Ref Range   Hgb A1c MFr Bld 6.8 (H) <5.7 % of total Hgb   Mean Plasma Glucose 148 (calc)   eAG (mmol/L) 8.2 (calc)  Lipid panel  Result Value Ref Range   Cholesterol 185 <200 mg/dL   HDL 49 >40 mg/dL   Triglycerides 125 <150 mg/dL   LDL Cholesterol (Calc) 112 (H) mg/dL (calc)   Total CHOL/HDL Ratio 3.8 <5.0 (calc)   Non-HDL Cholesterol (Calc) 136 (H) <130 mg/dL (calc)  Microalbumin / creatinine urine ratio  Result Value Ref Range   Creatinine, Urine 76 20 - 320 mg/dL   Microalb, Ur 0.4 mg/dL   Microalb Creat Ratio 5 <30 mcg/mg creat  COMPLETE METABOLIC PANEL WITH GFR  Result Value Ref Range   Glucose, Bld 114 (H) 65 - 99 mg/dL   BUN 16 7 - 25 mg/dL   Creat 0.90 0.60 - 1.35 mg/dL   GFR, Est Non African American 104 > OR = 60 mL/min/1.45m2   GFR, Est African American 121 > OR = 60 mL/min/1.36m2   BUN/Creatinine Ratio NOT APPLICABLE 6 - 22 (calc)   Sodium 138 135 - 146 mmol/L   Potassium 4.8 3.5 - 5.3 mmol/L   Chloride 101 98 - 110 mmol/L   CO2 27  20 - 32 mmol/L   Calcium 9.5 8.6 - 10.3 mg/dL   Total Protein 7.0 6.1 - 8.1 g/dL   Albumin 4.5 3.6 - 5.1 g/dL  Globulin 2.5 1.9 - 3.7 g/dL (calc)   AG Ratio 1.8 1.0 - 2.5 (calc)   Total Bilirubin 0.5 0.2 - 1.2 mg/dL   Alkaline phosphatase (APISO) 62 40 - 115 U/L   AST 20 10 - 40 U/L   ALT 35 9 - 46 U/L      Assessment & Plan:   Problem List Items Addressed This Visit      Cardiovascular and Mediastinum   Hypertension goal BP (blood pressure) < 140/90    Controlled today; weight loss encouraged; DASH guidelines recommended        Endocrine   Type 2 diabetes mellitus (HCC) - Primary    Check A1c; foot exam by MD      Relevant Medications   Dulaglutide (TRULICITY) 1.5 JI/3.44YO SOPN   Other Relevant Orders   Hemoglobin F1W   Basic metabolic panel     Other   Morbid obesity (HCC)    BMI >35 with diabetes, high cholesterol, HTN; encouraged weight loss      Relevant Medications   Dulaglutide (TRULICITY) 1.5 AQ/7.7JP SOPN   Hyperlipidemia LDL goal <100    Check lipids today; reduce saturated fats; work on weight loss      Relevant Orders   Lipid panel      Follow up plan: Return in about 6 months (around 11/16/2018).  An after-visit summary was printed and given to the patient at [place].  Please see the patient instructions which may contain other information and recommendations beyond what is mentioned above in the assessment and plan.  Meds ordered this encounter  Medications  . Dulaglutide (TRULICITY) 1.5 VG/6.8DP SOPN    Sig: Inject 0.5 ml subcutaneously once a week    Dispense:  4 pen    Refill:  11    Orders Placed This Encounter  Procedures  . Hemoglobin A1C  . Lipid panel  . Basic metabolic panel

## 2018-05-17 NOTE — Assessment & Plan Note (Signed)
Check lipids today; reduce saturated fats; work on weight loss

## 2018-05-17 NOTE — Patient Instructions (Addendum)
Check out the information at familydoctor.org entitled "Nutrition for Weight Loss: What You Need to Know about Fad Diets" Try to lose between 1-2 pounds per week by taking in fewer calories and burning off more calories You can succeed by limiting portions, limiting foods dense in calories and fat, becoming more active, and drinking 8 glasses of water a day (64 ounces) Don't skip meals, especially breakfast, as skipping meals may alter your metabolism Do not use over-the-counter weight loss pills or gimmicks that claim rapid weight loss A healthy BMI (or body mass index) is between 18.5 and 24.9 You can calculate your ideal BMI at the [place] website [employer]Mon[employer]fr  Try to follow the DASH guidelines (DASH stands for Dietary Approaches to Stop Hypertension). Try to limit the sodium in your diet to no more than 1,500mg  of sodium per day. Certainly try to not exceed 2,000 mg per day at the very most. Do not add salt when cooking or at the table.  Check the sodium amount on labels when shopping, and choose items lower in sodium when given a choice. Avoid or limit foods that already contain a lot of sodium. Eat a diet rich in fruits and vegetables and whole grains, and try to lose weight if overweight or obese  Try to limit saturated fats in your diet (bologna, hot dogs, barbeque, cheeseburgers, hamburgers, steak, bacon, sausage, cheese, etc.) and get more fresh fruits, vegetables, and whole grains   Cholesterol Cholesterol is a white, waxy, fat-like substance that is needed by the human body in small amounts. The liver makes all the cholesterol we need. Cholesterol is carried from the liver by the blood through the blood vessels. Deposits of cholesterol (plaques) may build up on blood vessel (artery) walls. Plaques make the arteries narrower and stiffer. Cholesterol plaques increase the risk for heart attack and stroke. You cannot feel your cholesterol  level even if it is very high. The only way to know that it is high is to have a blood test. Once you know your cholesterol levels, you should keep a record of the test results. Work with your health care provider to keep your levels in the desired range. What do the results mean?  Total cholesterol is a rough measure of all the cholesterol in your blood.  LDL (low-density lipoprotein) is the "bad" cholesterol. This is the type that causes plaque to build up on the artery walls. You want this level to be low.  HDL (high-density lipoprotein) is the "good" cholesterol because it cleans the arteries and carries the LDL away. You want this level to be high.  Triglycerides are fat that the body can either burn for energy or store. High levels are closely linked to heart disease. What are the desired levels of cholesterol?  Total cholesterol below 200.  LDL below 100 for people who are at risk, below 70 for people at very high risk.  HDL above 40 is good. A level of 60 or higher is considered to be protective against heart disease.  Triglycerides below 150. How can I lower my cholesterol? Diet Follow your diet program as told by your health care provider.  Choose fish or white meat chicken and [place], roasted or baked. Limit fatty cuts of red meat, fried foods, and processed meats, such as sausage and lunch meats.  Eat lots of fresh fruits and vegetables.  Choose whole grains, beans, pasta, potatoes, and cereals.  Choose olive oil, corn oil, or canola oil, and use only small amounts.  Avoid butter, mayonnaise, shortening, or palm kernel oils.  Avoid foods with trans fats.  Drink skim or nonfat milk and eat low-fat or nonfat yogurt and cheeses. Avoid whole milk, cream, ice cream, egg yolks, and full-fat cheeses.  Healthier desserts include angel food cake, ginger snaps, animal crackers, hard candy, popsicles, and low-fat or nonfat frozen yogurt. Avoid pastries, cakes, pies, and  cookies.  Exercise  Follow your exercise program as told by your health care provider. A regular program: ? Helps to decrease LDL and raise HDL. ? Helps with weight control.  Do things that increase your activity level, such as gardening, walking, and taking the stairs.  Ask your health care provider about ways that you can be more active in your daily life.  Medicine  Take over-the-counter and prescription medicines only as told by your health care provider. ? Medicine may be prescribed by your health care provider to help lower cholesterol and decrease the risk for heart disease. This is usually done if diet and exercise have failed to bring down cholesterol levels. ? If you have several risk factors, you may need medicine even if your levels are normal.  This information is not intended to replace advice given to you by your health care provider. Make sure you discuss any questions you have with your health care provider. Document Released: 08/10/2001 Document Revised: 06/12/2016 Document Reviewed: 05/15/2016 Elsevier Interactive Patient Education  2018 [employer].  Obesity, Adult Obesity is the condition of having too much total body fat. Being overweight or obese means that your weight is greater than what is considered healthy for your body size. Obesity is determined by a measurement called BMI. BMI is an estimate of body fat and is calculated from height and weight. For adults, a BMI of 30 or higher is considered obese. Obesity can eventually lead to other health concerns and major illnesses, including:  Stroke.  Coronary artery disease (CAD).  Type 2 diabetes.  Some types of cancer, including cancers of the colon, breast, uterus, and gallbladder.  Osteoarthritis.  High blood pressure (hypertension).  High cholesterol.  Sleep apnea.  Gallbladder stones.  Infertility problems.  What are the causes? The main cause of obesity is taking in (consuming) more  calories than your body uses for energy. Other factors that contribute to this condition may include:  Being born with genes that make you more likely to become obese.  Having a medical condition that causes obesity. These conditions include: ? Hypothyroidism. ? Polycystic ovarian syndrome (PCOS). ? Binge-eating disorder. ? Cushing syndrome.  Taking certain medicines, such as steroids, antidepressants, and seizure medicines.  Not being physically active (sedentary lifestyle).  Living where there are limited places to exercise safely or buy healthy foods.  Not getting enough sleep.  What increases the risk? The following factors may increase your risk of this condition:  Having a family history of obesity.  Being a woman of African-American descent.  Being a man of Hispanic descent.  What are the signs or symptoms? Having excessive body fat is the main symptom of this condition. How is this diagnosed? This condition may be diagnosed based on:  Your symptoms.  Your medical history.  A physical exam. Your health care provider may measure: ? Your BMI. If you are an adult with a BMI between 25 and less than 30, you are considered overweight. If you are an adult with a BMI of 30 or higher, you are considered obese. ? The distances around your hips and  your waist (circumferences). These may be compared to each other to help diagnose your condition. ? Your skinfold thickness. Your health care provider may gently pinch a fold of your skin and measure it.  How is this treated? Treatment for this condition often includes changing your lifestyle. Treatment may include some or all of the following:  Dietary changes. Work with your health care provider and a dietitian to set a weight-loss goal that is healthy and reasonable for you. Dietary changes may include eating: ? Smaller portions. A portion size is the amount of a particular food that is healthy for you to eat at one time. This  varies from person to person. ? Low-calorie or low-fat options. ? More whole grains, fruits, and vegetables.  Regular physical activity. This may include aerobic activity (cardio) and strength training.  Medicine to help you lose weight. Your health care provider may prescribe medicine if you are unable to lose 1 pound a week after 6 weeks of eating more healthily and doing more physical activity.  Surgery. Surgical options may include gastric banding and gastric bypass. Surgery may be done if: ? Other treatments have not helped to improve your condition. ? You have a BMI of 40 or higher. ? You have life-threatening health problems related to obesity.  Follow these instructions at home:  Eating and drinking   Follow recommendations from your health care provider about what you eat and drink. Your health care provider may advise you to: ? Limit fast foods, sweets, and processed snack foods. ? Choose low-fat options, such as low-fat milk instead of whole milk. ? Eat 5 or more servings of fruits or vegetables every day. ? Eat at home more often. This gives you more control over what you eat. ? Choose healthy foods when you eat out. ? Learn what a healthy portion size is. ? Keep low-fat snacks on hand. ? Avoid sugary drinks, such as soda, fruit juice, iced tea sweetened with sugar, and flavored milk. ? Eat a healthy breakfast.  Drink enough water to keep your urine clear or pale yellow.  Do not go without eating for long periods of time (do not fast) or follow a fad diet. Fasting and fad diets can be unhealthy and even dangerous. Physical Activity  Exercise regularly, as told by your health care provider. Ask your health care provider what types of exercise are safe for you and how often you should exercise.  Warm up and stretch before being active.  Cool down and stretch after being active.  Rest between periods of activity. Lifestyle  Limit the time that you spend in front of  your TV, computer, or video game system.  Find ways to reward yourself that do not involve food.  Limit alcohol intake to no more than 1 drink a day for nonpregnant women and 2 drinks a day for men. One drink equals 12 oz of beer, 5 oz of wine, or 1 oz of hard liquor. General instructions  Keep a weight loss journal to keep track of the food you eat and how much you exercise you get.  Take over-the-counter and prescription medicines only as told by your health care provider.  Take vitamins and supplements only as told by your health care provider.  Consider joining a support group. Your health care provider may be able to recommend a support group.  Keep all follow-up visits as told by your health care provider. This is important. Contact a health care provider if:  You  are unable to meet your weight loss goal after 6 weeks of dietary and lifestyle changes. This information is not intended to replace advice given to you by your health care provider. Make sure you discuss any questions you have with your health care provider. Document Released: 12/23/2004 Document Revised: 04/19/2016 Document Reviewed: 09/03/2015 Elsevier Interactive Patient Education  2018 [place].  High-Fiber Diet Fiber, also called dietary fiber, is a type of carbohydrate found in fruits, vegetables, whole grains, and beans. A high-fiber diet can have many health benefits. Your health care provider may recommend a high-fiber diet to help:  Prevent constipation. Fiber can make your bowel movements more regular.  Lower your cholesterol.  Relieve hemorrhoids, uncomplicated diverticulosis, or irritable bowel syndrome.  Prevent overeating as part of a weight-loss plan.  Prevent heart disease, type 2 diabetes, and certain cancers.  What is my plan? The recommended daily intake of fiber includes:  38 grams for men under age 50.  36 grams for men over age 42.  55 grams for women under age 68.  27 grams  for women over age 6.  You can get the recommended daily intake of dietary fiber by eating a variety of fruits, vegetables, grains, and beans. Your health care provider may also recommend a fiber supplement if it is not possible to get enough fiber through your diet. What do I need to know about a high-fiber diet?  Fiber supplements have not been widely studied for their effectiveness, so it is better to get fiber through food sources.  Always check the fiber content on thenutrition facts label of any prepackaged food. Look for foods that contain at least 5 grams of fiber per serving.  Ask your dietitian if you have questions about specific foods that are related to your condition, especially if those foods are not listed in the following section.  Increase your daily fiber consumption gradually. Increasing your intake of dietary fiber too quickly may cause bloating, cramping, or gas.  Drink plenty of water. Water helps you to digest fiber. What foods can I eat? Grains Whole-grain breads. Multigrain cereal. Oats and oatmeal. Brown rice. Barley. Bulgur wheat. [place]. Bran muffins. Popcorn. Rye wafer crackers. Vegetables Sweet potatoes. Spinach. Kale. Artichokes. Cabbage. Broccoli. Green peas. Carrots. Squash. Fruits Berries. Pears. Apples. Oranges. Avocados. Prunes and raisins. Dried figs. Meats and Other Protein Sources [place], kidney, pinto, and soy beans. Split peas. Lentils. Nuts and seeds. Dairy Fiber-fortified yogurt. Beverages Fiber-fortified soy milk. Fiber-fortified orange juice. Other Fiber bars. The items listed above may not be a complete list of recommended foods or beverages. Contact your dietitian for more options. What foods are not recommended? Grains White bread. Pasta made with refined flour. White rice. Vegetables Fried potatoes. Canned vegetables. Well-cooked vegetables. Fruits Fruit juice. Cooked, strained fruit. Meats and Other Protein Sources Fatty cuts of  meat. Fried Sales executive or fried fish. Dairy Milk. Yogurt. Cream cheese. Sour cream. Beverages Soft drinks. Other Cakes and pastries. Butter and oils. The items listed above may not be a complete list of foods and beverages to avoid. Contact your dietitian for more information. What are some tips for including high-fiber foods in my diet?  Eat a wide variety of high-fiber foods.  Make sure that half of all grains consumed each day are whole grains.  Replace breads and cereals made from refined flour or white flour with whole-grain breads and cereals.  Replace white rice with brown rice, bulgur wheat, or millet.  Start the day with a breakfast  that is high in fiber, such as a cereal that contains at least 5 grams of fiber per serving.  Use beans in place of meat in soups, salads, or pasta.  Eat high-fiber snacks, such as berries, raw vegetables, nuts, or popcorn. This information is not intended to replace advice given to you by your health care provider. Make sure you discuss any questions you have with your health care provider. Document Released: 11/15/2005 Document Revised: 04/22/2016 Document Reviewed: 04/30/2014 Elsevier Interactive Patient Education  [employer].

## 2018-05-17 NOTE — Assessment & Plan Note (Signed)
BMI >35 with diabetes, high cholesterol, HTN; encouraged weight loss

## 2018-05-17 NOTE — Assessment & Plan Note (Signed)
Controlled today; weight loss encouraged; DASH guidelines recommended

## 2018-05-17 NOTE — Assessment & Plan Note (Signed)
Check A1c; foot exam by MD

## 2018-05-18 LAB — BASIC METABOLIC PANEL
BUN: 16 mg/dL (ref 7–25)
CO2: 28 mmol/L (ref 20–32)
Calcium: 9.5 mg/dL (ref 8.6–10.3)
Chloride: 106 mmol/L (ref 98–110)
Creat: 0.87 mg/dL (ref 0.60–1.35)
Glucose, Bld: 100 mg/dL — ABNORMAL HIGH (ref 65–99)
Potassium: 4.7 mmol/L (ref 3.5–5.3)
Sodium: 141 mmol/L (ref 135–146)

## 2018-05-18 LAB — HEMOGLOBIN A1C
Hgb A1c MFr Bld: 6.7 % of total Hgb — ABNORMAL HIGH (ref <5.7)
Mean Plasma Glucose: 146 (calc)
eAG (mmol/L): 8.1 (calc)

## 2018-05-18 LAB — LIPID PANEL
Cholesterol: 124 mg/dL (ref <200)
HDL: 39 mg/dL — ABNORMAL LOW (ref >40)
LDL Cholesterol (Calc): 68 mg/dL (calc)
Non-HDL Cholesterol (Calc): 85 mg/dL (calc) (ref <130)
Total CHOL/HDL Ratio: 3.2 (calc) (ref <5.0)
Triglycerides: 88 mg/dL (ref <150)

## 2018-06-30 ENCOUNTER — Other Ambulatory Visit: Payer: Self-pay | Admitting: Family Medicine

## 2018-07-07 DIAGNOSIS — L578 Other skin changes due to chronic exposure to nonionizing radiation: Secondary | ICD-10-CM | POA: Diagnosis not present

## 2018-07-07 DIAGNOSIS — R21 Rash and other nonspecific skin eruption: Secondary | ICD-10-CM | POA: Diagnosis not present

## 2018-07-07 DIAGNOSIS — L918 Other hypertrophic disorders of the skin: Secondary | ICD-10-CM | POA: Diagnosis not present

## 2018-07-13 ENCOUNTER — Other Ambulatory Visit: Payer: Self-pay | Admitting: Family Medicine

## 2018-07-18 ENCOUNTER — Other Ambulatory Visit: Payer: Self-pay | Admitting: Family Medicine

## 2018-07-18 MED ORDER — RANITIDINE HCL 150 MG PO TABS: 150.0000 mg | ORAL_TABLET | Freq: Two times a day (BID) | ORAL | 5 refills | Status: DC

## 2018-07-18 NOTE — Progress Notes (Signed)
Switch dose, 300 mg not covered by insurance

## 2018-07-19 ENCOUNTER — Other Ambulatory Visit: Payer: Self-pay | Admitting: Nurse Practitioner

## 2018-08-14 ENCOUNTER — Other Ambulatory Visit: Payer: Self-pay | Admitting: Family Medicine

## 2018-08-14 DIAGNOSIS — J309 Allergic rhinitis, unspecified: Secondary | ICD-10-CM

## 2018-08-14 DIAGNOSIS — R0982 Postnasal drip: Principal | ICD-10-CM

## 2018-08-29 DIAGNOSIS — E119 Type 2 diabetes mellitus without complications: Secondary | ICD-10-CM | POA: Diagnosis not present

## 2018-08-29 LAB — HM DIABETES EYE EXAM

## 2018-11-12 ENCOUNTER — Other Ambulatory Visit: Payer: Self-pay | Admitting: Family Medicine

## 2018-11-12 NOTE — Telephone Encounter (Signed)
Lab Results  Component Value Date   ALT 35 11/15/2017   Lab Results  Component Value Date   CHOL 124 05/17/2018   HDL 39 (L) 05/17/2018   LDLCALC 68 05/17/2018   TRIG 88 05/17/2018   CHOLHDL 3.2 05/17/2018

## 2018-11-17 ENCOUNTER — Encounter: Payer: Self-pay | Admitting: Family Medicine

## 2018-11-17 ENCOUNTER — Ambulatory Visit: Payer: 59 | Admitting: Family Medicine

## 2018-11-17 VITALS — BP 132/82 | HR 98 | Temp 98.0°F | Ht 69.0 in | Wt 243.7 lb

## 2018-11-17 DIAGNOSIS — E785 Hyperlipidemia, unspecified: Secondary | ICD-10-CM

## 2018-11-17 DIAGNOSIS — I10 Essential (primary) hypertension: Secondary | ICD-10-CM | POA: Diagnosis not present

## 2018-11-17 DIAGNOSIS — M25562 Pain in left knee: Secondary | ICD-10-CM | POA: Diagnosis not present

## 2018-11-17 DIAGNOSIS — Z9989 Dependence on other enabling machines and devices: Secondary | ICD-10-CM

## 2018-11-17 DIAGNOSIS — Z5181 Encounter for therapeutic drug level monitoring: Secondary | ICD-10-CM

## 2018-11-17 DIAGNOSIS — G8929 Other chronic pain: Secondary | ICD-10-CM

## 2018-11-17 DIAGNOSIS — Z23 Encounter for immunization: Secondary | ICD-10-CM | POA: Diagnosis not present

## 2018-11-17 DIAGNOSIS — R740 Nonspecific elevation of levels of transaminase and lactic acid dehydrogenase [LDH]: Secondary | ICD-10-CM | POA: Diagnosis not present

## 2018-11-17 DIAGNOSIS — E119 Type 2 diabetes mellitus without complications: Secondary | ICD-10-CM | POA: Diagnosis not present

## 2018-11-17 DIAGNOSIS — G4733 Obstructive sleep apnea (adult) (pediatric): Secondary | ICD-10-CM

## 2018-11-17 NOTE — Assessment & Plan Note (Signed)
Use less salt, try to follow DASH guidelines

## 2018-11-17 NOTE — Progress Notes (Signed)
BP 132/82   Pulse 98   Temp 98 F (36.7 C) (Oral)   Ht 5\' 9"  (1.753 m)   Wt 243 lb 11.2 oz (110.5 kg)   SpO2 98%   BMI 35.99 kg/m    Subjective:    Patient ID: Wayne Flowers, male    DOB: 1976/10/22, 42 y.o.   MRN: [id number]  HPI: Wayne Flowers is a 42 y.o. male  Chief Complaint  Patient presents with  . Follow-up  . Knee Pain    left, has trouble sleeping with it.  back of the knee    HPI  Left knee pain for about two months; worse at night; tried a brace No swelling; no redness; not sore to light touch No injury Tried ibuprofen or advil, does not really help; the one with benadryl helps him sleep Bursitis 20 some years ago same knee Does not lock or pop; some times feels a little weak but no buckling  HTN; checks BP away from the doctor, 470 systolic; nothing in the 929V; tries to avoid extra salt, not putting much on at the table; cooks with some salt  OSA; not wearing the CPAP right now; can't sleep with it on; never had it bad; mild case he says  Type 2 diabetes; no problems with feet; eye exam 2-3 months ago; avoiding sweetened drinks  Lab Results  Component Value Date   HGBA1C 6.7 (H) 05/17/2018    Morbid obesity; did lose weight on Trulicity then went on a cruise; trouble losing weight; does monitor steps, varies; goal at 10k steps a day, hitting that half the days; not a good water drinker  Depression screen [facility] 2/9 11/17/2018 05/17/2018 02/28/2018 09/08/2017 03/11/2017  Decreased Interest 0 0 0 0 0  Down, Depressed, Hopeless 0 0 0 0 0  PHQ - 2 Score 0 0 0 0 0  Altered sleeping 0 - - - -  Tired, decreased energy 0 - - - -  Change in appetite 0 - - - -  Feeling bad or failure about yourself  0 - - - -  Trouble concentrating 0 - - - -  Moving slowly or fidgety/restless 0 - - - -  Suicidal thoughts 0 - - - -  PHQ-9 Score 0 - - - -  Difficult doing work/chores Not difficult at all - - - -   Fall Risk  11/17/2018 05/17/2018 02/28/2018 09/08/2017  03/11/2017  Falls in the past year? 0 No No No No  Number falls in past yr: 0 - - - -  Injury with Fall? 0 - - - -    Relevant past medical, surgical, family and social history reviewed Past Medical History:  Diagnosis Date  . Allergy   . Atelectasis   . Chronic kidney disease    KIDNEY STONE  . ED (erectile dysfunction)   . ED (erectile dysfunction)   . GERD (gastroesophageal reflux disease)   . Hearing loss    right  . Hypertension   . Metabolic syndrome   . Morbid obesity ([place]) 09/08/2017   BMI >35 plus diabetes  . Obesity, Class I, BMI 30.0-34.9 (see actual BMI)   . Pleurisy   . PONV (postoperative nausea and vomiting)   . Sleep apnea    HAS NOT USED CPAP IN 6 MONTHS  . Type 2 diabetes mellitus ([place]) 06/23/2016   Past Surgical History:  Procedure Laterality Date  . CYSTOSCOPY W/ RETROGRADES Left 09/16/2015   Procedure: CYSTOSCOPY WITH RETROGRADE PYELOGRAM;  Surgeon: Royston Cowper, MD;  Location: [facility] ORS;  Service: Urology;  Laterality: Left;  . CYSTOSCOPY WITH URETEROSCOPY AND STENT PLACEMENT    . URETEROSCOPY WITH HOLMIUM LASER LITHOTRIPSY Left 09/16/2015   Procedure: URETEROSCOPY WITH HOLMIUM LASER LITHOTRIPSY;  Surgeon: Royston Cowper, MD;  Location: [facility] ORS;  Service: Urology;  Laterality: Left;  Marland Kitchen VASECTOMY     Family History  Problem Relation Age of Onset  . Diabetes Mother   . Cancer Father        renal  . Spina bifida Sister   . Appendicitis Son   . Cancer Maternal Grandfather        black lung  . Cancer Paternal Grandmother        lung  . Diabetes Sister    Social History   Tobacco Use  . Smoking status: Never Smoker  . Smokeless tobacco: Never Used  Substance Use Topics  . Alcohol use: Yes    Alcohol/week: 0.0 - 1.0 standard drinks    Comment: rarley  . Drug use: No     Office Visit from 11/17/2018 in [facility]  AUDIT-C Score  1      Interim medical history since last visit reviewed. Allergies and medications  reviewed  Review of Systems Per HPI unless specifically indicated above     Objective:    BP 132/82   Pulse 98   Temp 98 F (36.7 C) (Oral)   Ht 5\' 9"  (1.753 m)   Wt 243 lb 11.2 oz (110.5 kg)   SpO2 98%   BMI 35.99 kg/m   Wt Readings from Last 3 Encounters:  11/17/18 243 lb 11.2 oz (110.5 kg)  05/17/18 241 lb 6.4 oz (109.5 kg)  02/28/18 240 lb 3.2 oz (109 kg)    Physical Exam Constitutional:      General: He is not in acute distress.    Appearance: He is well-developed.  HENT:     Head: Normocephalic and atraumatic.  Eyes:     General: No scleral icterus. Neck:     Thyroid: No thyromegaly.  Cardiovascular:     Rate and Rhythm: Normal rate and regular rhythm.  Pulmonary:     Effort: Pulmonary effort is normal.     Breath sounds: Normal breath sounds.  Abdominal:     General: Bowel sounds are normal. There is no distension.     Palpations: Abdomen is soft.  Musculoskeletal:     Left knee: He exhibits no swelling, no effusion, no deformity and no erythema. No tenderness found.  Skin:    General: Skin is warm and dry.     Coloration: Skin is not pale.  Neurological:     Coordination: Coordination normal.  Psychiatric:        Behavior: Behavior normal.        Thought Content: Thought content normal.        Judgment: Judgment normal.    Diabetic Foot Form - Detailed   Diabetic Foot Exam - detailed Diabetic Foot exam was performed with the following findings:  Yes 11/17/2018 10:28 AM  Visual Foot Exam completed.:  Yes  Pulse Foot Exam completed.:  Yes  Right Dorsalis Pedis:  Present Left Dorsalis Pedis:  Present  Sensory Foot Exam Completed.:  Yes Semmes-Weinstein Monofilament Test R Site 1-Great Toe:  Pos L Site 1-Great Toe:  Pos        Results for orders placed or performed in visit on 08/29/18  HM DIABETES EYE EXAM  Result  Value Ref Range   HM Diabetic Eye Exam No Retinopathy No Retinopathy      Assessment & Plan:   Problem List Items Addressed  This Visit      Cardiovascular and Mediastinum   Hypertension goal BP (blood pressure) < 140/90    Use less salt, try to follow DASH guidelines        Respiratory   Obstructive sleep apnea treated with continuous positive airway pressure (CPAP)    Weight loss encouraged; untreated OSA can lead to heart failure, sudden death; refer back to ENT      Relevant Orders   Ambulatory referral to ENT     Endocrine   Type 2 diabetes mellitus ([place]) - Primary    Check A1c today      Relevant Orders   Microalbumin / creatinine urine ratio   Lipid panel   Hemoglobin A1c     Other   Morbid obesity (HCC)    BMI >70; on Trulicity; offered referral to nutritionist but he has already done that; increase activity, increase water intake      Medication monitoring encounter    Check liver and kidneys      Relevant Orders   COMPLETE METABOLIC PANEL WITH GFR   Hyperlipidemia LDL goal <70    Check lipids today; try to limit saturated fats      Relevant Orders   Lipid panel    Other Visit Diagnoses    Need for influenza vaccination       Relevant Orders   Flu Vaccine QUAD 6+ mos PF IM (Fluarix Quad PF) (Completed)   Chronic pain of left knee       Relevant Orders   Ambulatory referral to Orthopedics       Follow up plan: Return in about 6 months (around 05/19/2019) for follow-up visit with Dr. Sanda Klein.  An after-visit summary was printed and given to the patient at [place].  Please see the patient instructions which may contain other information and recommendations beyond what is mentioned above in the assessment and plan.  No orders of the defined types were placed in this encounter.   Orders Placed This Encounter  Procedures  . Flu Vaccine QUAD 6+ mos PF IM (Fluarix Quad PF)  . Microalbumin / creatinine urine ratio  . Lipid panel  . Hemoglobin A1c  . COMPLETE METABOLIC PANEL WITH GFR  . Ambulatory referral to Orthopedics  . Ambulatory referral to ENT

## 2018-11-17 NOTE — Assessment & Plan Note (Signed)
Check liver and kidneys 

## 2018-11-17 NOTE — Patient Instructions (Addendum)
Someone should contact you about an orthopaedic referral soon Let's get labs today If you have not heard anything from my staff in a week about any orders/referrals/studies from today, please contact us here to follow-up [phone]  Please do see your eye doctor regularly, and have your eyes examined every year (or more often per his or her recommendation) Check your feet every night and let me know right away of any sores, infections, numbness, etc. Try to limit sweets, white bread, white rice, white potatoes It is okay with me for you to not check your fingerstick blood sugars unless you are interested and feel it would be helpful for you  Try to limit saturated fats in your diet (bologna, hot dogs, barbeque, cheeseburgers, hamburgers, steak, bacon, sausage, cheese, etc.) and get more fresh fruits, vegetables, and whole grains  Try to follow the DASH guidelines (DASH stands for Dietary Approaches to Stop Hypertension). Try to limit the sodium in your diet to no more than 1,500mg  of sodium per day. Certainly try to not exceed 2,000 mg per day at the very most. Do not add salt when cooking or at the table.  Check the sodium amount on labels when shopping, and choose items lower in sodium when given a choice. Avoid or limit foods that already contain a lot of sodium. Eat a diet rich in fruits and vegetables and whole grains, and try to lose weight if overweight or obese   Preventing Unhealthy Weight Gain, Adult Staying at a healthy weight is important to your overall health. When fat builds up in your body, you may become overweight or obese. Being overweight or obese increases your risk of developing certain health problems, such as heart disease, diabetes, sleeping problems, joint problems, and some types of cancer. Unhealthy weight gain is often the result of making unhealthy food choices or not getting enough exercise. You can make changes to your lifestyle to prevent obesity and stay as  healthy as possible. What nutrition changes can be made?   Eat only as much as your body needs. To do this: ? Pay attention to signs that you are hungry or full. Stop eating as soon as you feel full. ? If you feel hungry, try drinking water first before eating. Drink enough water so your urine is clear or pale yellow. ? Eat smaller portions. Pay attention to portion sizes when eating out. ? Look at serving sizes on food labels. Most foods contain more than one serving per container. ? Eat the recommended number of calories for your gender and activity level. For most active people, a daily total of 2,000 calories is appropriate. If you are trying to lose weight or are not very active, you may need to eat fewer calories. Talk with your health care provider or a diet and nutrition specialist (dietitian) about how many calories you need each day.  Choose healthy foods, such as: ? Fruits and vegetables. At each meal, try to fill at least half of your plate with fruits and vegetables. ? Whole grains, such as whole-wheat bread, brown rice, and quinoa. ? Lean meats, such as chicken or fish. ? Other healthy proteins, such as beans, eggs, or tofu. ? Healthy fats, such as nuts, seeds, fatty fish, and olive oil. ? Low-fat or fat-free dairy products.  Check food labels, and avoid food and drinks that: ? Are high in calories. ? Have added sugar. ? Are high in sodium. ? Have saturated fats or trans fats.  Cook foods  in healthier ways, such as by baking, broiling, or grilling.  Make a meal plan for the week, and shop with a grocery list to help you stay on track with your purchases. Try to avoid going to the grocery store when you are hungry.  When grocery shopping, try to shop around the outside of the store first, where the fresh foods are. Doing this helps you to avoid prepackaged foods, which can be high in sugar, salt (sodium), and fat. What lifestyle changes can be made?   Exercise for 30 or  more minutes on 5 or more days each week. Exercising may include brisk walking, yard work, biking, running, swimming, and team sports like basketball and soccer. Ask your health care provider which exercises are safe for you.  Do muscle-strengthening activities, such as lifting weights or using resistance bands, on 2 or more days a week.  Do not use any products that contain nicotine or tobacco, such as cigarettes and e-cigarettes. If you need help quitting, ask your health care provider.  Limit alcohol intake to no more than 1 drink a day for nonpregnant women and 2 drinks a day for men. One drink equals 12 oz of beer, 5 oz of wine, or 1 oz of hard liquor.  Try to get 7-9 hours of sleep each night. What other changes can be made?  Keep a food and activity journal to keep track of: ? What you ate and how many calories you had. Remember to count the calories in sauces, dressings, and side dishes. ? Whether you were active, and what exercises you did. ? Your calorie, weight, and activity goals.  Check your weight regularly. Track any changes. If you notice you have gained weight, make changes to your diet or activity routine.  Avoid taking weight-loss medicines or supplements. Talk to your health care provider before starting any new medicine or supplement.  Talk to your health care provider before trying any new diet or exercise plan. Why are these changes important? Eating healthy, staying active, and having healthy habits can help you to prevent obesity. Those changes also:  Help you manage stress and emotions.  Help you connect with friends and family.  Improve your self-esteem.  Improve your sleep.  Prevent long-term health problems. What can happen if changes are not made? Being obese or overweight can cause you to develop joint or bone problems, which can make it hard for you to stay active or do activities you enjoy. Being obese or overweight also puts stress on your heart  and lungs and can lead to health problems like diabetes, heart disease, and some cancers. Where to find more information Talk with your health care provider or a dietitian about healthy eating and healthy lifestyle choices. You may also find information from:  U.[place]. Department of Agriculture, MyPlate: FormerBoss.no  American Heart Association: www.heart.org  Centers for Disease Control and Prevention: http://www.wolf.info/ Summary  Staying at a healthy weight is important to your overall health. It helps you to prevent certain diseases and health problems, such as heart disease, diabetes, joint problems, sleep disorders, and some types of cancer.  Being obese or overweight can cause you to develop joint or bone problems, which can make it hard for you to stay active or do activities you enjoy.  You can prevent unhealthy weight gain by eating a healthy diet, exercising regularly, not smoking, limiting alcohol, and getting enough sleep.  Talk with your health care provider or a dietitian for guidance about healthy  eating and healthy lifestyle choices. This information is not intended to replace advice given to you by your health care provider. Make sure you discuss any questions you have with your health care provider. Document Released: 11/16/2016 Document Revised: 08/26/2017 Document Reviewed: 12/22/2016 Elsevier Interactive Patient Education  2019 [employer].

## 2018-11-17 NOTE — Assessment & Plan Note (Signed)
BMI >94; on Trulicity; offered referral to nutritionist but he has already done that; increase activity, increase water intake

## 2018-11-17 NOTE — Assessment & Plan Note (Signed)
Weight loss encouraged; untreated OSA can lead to heart failure, sudden death; refer back to ENT

## 2018-11-17 NOTE — Assessment & Plan Note (Signed)
Check lipids today; try to limit saturated fats

## 2018-11-17 NOTE — Assessment & Plan Note (Signed)
Check A1c today.

## 2018-11-20 NOTE — Progress Notes (Signed)
Wayne Flowers, please let the patient know that he is not spilling excessive protein through his kidneys (great news). His cholesterol is fantastic! His A1c shows excellent control of his diabetes One liver enzyme is a little elevated, but I don't know why; let's ADD ON an acute hepatitis panel now to blood in lab (dx elevated ALT); there is notation of fatty liver per an old test run by Dr. Rutherford Nail; let's check RECHECK a hepatic function panel in 6 weeks as he works on weight loss (dx elevated ALT)

## 2018-11-21 ENCOUNTER — Telehealth: Payer: Self-pay

## 2018-11-21 DIAGNOSIS — R740 Nonspecific elevation of levels of transaminase and lactic acid dehydrogenase [LDH]: Principal | ICD-10-CM

## 2018-11-21 DIAGNOSIS — R7401 Elevation of levels of liver transaminase levels: Secondary | ICD-10-CM

## 2018-11-21 LAB — HEPATITIS PANEL, ACUTE
Hep A IgM: NONREACTIVE
Hep B C IgM: NONREACTIVE
Hepatitis B Surface Ag: NONREACTIVE
Hepatitis C Ab: NONREACTIVE
SIGNAL TO CUT-OFF: 0.01 (ref <1.00)

## 2018-11-21 LAB — COMPLETE METABOLIC PANEL WITH GFR
AG Ratio: 2 (calc) (ref 1.0–2.5)
ALT: 53 U/L — ABNORMAL HIGH (ref 9–46)
AST: 24 U/L (ref 10–40)
Albumin: 4.5 g/dL (ref 3.6–5.1)
Alkaline phosphatase (APISO): 69 U/L (ref 40–115)
BUN: 16 mg/dL (ref 7–25)
CO2: 27 mmol/L (ref 20–32)
Calcium: 9.5 mg/dL (ref 8.6–10.3)
Chloride: 106 mmol/L (ref 98–110)
Creat: 1 mg/dL (ref 0.60–1.35)
GFR, Est African American: 106 mL/min/{1.73_m2} (ref >=60)
GFR, Est Non African American: 91 mL/min/{1.73_m2} (ref >=60)
Globulin: 2.2 g/dL (calc) (ref 1.9–3.7)
Glucose, Bld: 114 mg/dL — ABNORMAL HIGH (ref 65–99)
Potassium: 4.7 mmol/L (ref 3.5–5.3)
Sodium: 140 mmol/L (ref 135–146)
Total Bilirubin: 0.4 mg/dL (ref 0.2–1.2)
Total Protein: 6.7 g/dL (ref 6.1–8.1)

## 2018-11-21 LAB — LIPID PANEL
Cholesterol: 119 mg/dL (ref <200)
HDL: 41 mg/dL (ref >40)
LDL Cholesterol (Calc): 62 mg/dL (calc)
Non-HDL Cholesterol (Calc): 78 mg/dL (calc) (ref <130)
Total CHOL/HDL Ratio: 2.9 (calc) (ref <5.0)
Triglycerides: 82 mg/dL (ref <150)

## 2018-11-21 LAB — HEMOGLOBIN A1C
Hgb A1c MFr Bld: 6.6 % of total Hgb — ABNORMAL HIGH (ref <5.7)
Mean Plasma Glucose: 143 (calc)
eAG (mmol/L): 7.9 (calc)

## 2018-11-21 LAB — TEST AUTHORIZATION

## 2018-11-21 LAB — MICROALBUMIN / CREATININE URINE RATIO
Creatinine, Urine: 276 mg/dL (ref 20–320)
Microalb Creat Ratio: 3 mcg/mg creat (ref <30)
Microalb, Ur: 0.7 mg/dL

## 2018-11-21 NOTE — Telephone Encounter (Signed)
-----   Message from Arnetha Courser, MD sent at 11/20/2018  5:20 PM EST ----- Joelene Millin, please let the patient know that he is not spilling excessive protein through his kidneys (great news). His cholesterol is fantastic! His A1c shows excellent control of his diabetes One liver enzyme is a little elevated, but I don't know why; let's ADD ON an acute hepatitis panel now to blood in lab (dx elevated ALT); there is notation of fatty liver per an old test run by Dr. Rutherford Nail; let's check RECHECK a hepatic function panel in 6 weeks as he works on weight loss (dx elevated ALT)

## 2018-11-21 NOTE — Progress Notes (Signed)
Wayne Flowers, please let the patient know that his tests for viral hepatitis were negative; good news

## 2018-12-04 DIAGNOSIS — M25562 Pain in left knee: Secondary | ICD-10-CM | POA: Diagnosis not present

## 2018-12-04 DIAGNOSIS — G8929 Other chronic pain: Secondary | ICD-10-CM | POA: Diagnosis not present

## 2018-12-07 ENCOUNTER — Other Ambulatory Visit: Payer: Self-pay | Admitting: Family Medicine

## 2018-12-07 ENCOUNTER — Other Ambulatory Visit: Payer: Self-pay | Admitting: Nurse Practitioner

## 2018-12-08 NOTE — Telephone Encounter (Signed)
Lab Results  Component Value Date   CREATININE 1.00 11/17/2018   Lab Results  Component Value Date   K 4.7 11/17/2018

## 2019-01-08 ENCOUNTER — Other Ambulatory Visit: Payer: Self-pay | Admitting: Nurse Practitioner

## 2019-01-11 ENCOUNTER — Other Ambulatory Visit: Payer: Self-pay | Admitting: Family Medicine

## 2019-01-11 NOTE — Telephone Encounter (Signed)
Copied from [place] [id number]. Topic: Quick Communication - Rx Refill/Question >> Jan 11, 2019  4:00 PM Blase Mess A wrote: Medication: sildenafil (REVATIO) 20 MG tablet [[id number]] -Prescription was sent to [facility] that has closed. Requesting the script to be sent to [facility].   Has the patient contacted their pharmacy? Yes  (Agent: If no, request that the patient contact the pharmacy for the refill.) (Agent: If yes, when and what did the pharmacy advise?)  Preferred Pharmacy (with phone number or street name): [facility] [address] [place], [place], [place] [address] [phone]  Agent: Please be advised that RX refills may take up to 3 business days. We ask that you follow-up with your pharmacy.

## 2019-01-11 NOTE — Telephone Encounter (Signed)
Patient needs Rx re-routed to different pharmacy.

## 2019-01-12 MED ORDER — SILDENAFIL CITRATE 20 MG PO TABS: ORAL_TABLET | ORAL | 5 refills | Status: DC

## 2019-03-13 ENCOUNTER — Other Ambulatory Visit: Payer: Self-pay | Admitting: Nurse Practitioner

## 2019-03-14 ENCOUNTER — Telehealth: Payer: Self-pay | Admitting: Family Medicine

## 2019-03-14 MED ORDER — FAMOTIDINE 20 MG PO TABS: 20.0000 mg | ORAL_TABLET | Freq: Two times a day (BID) | ORAL | 2 refills | Status: DC | PRN

## 2019-03-14 NOTE — Telephone Encounter (Signed)
So another provider denied the ranitidine on January 9th, February 10th, and yesterday Please let him know that I did not receive those refill requests and I did not deny them Please let him know that ranitidine has been recalled and we're sorry that he did not know that I'll be glad to send in another medicine to take its place

## 2019-03-14 NOTE — Telephone Encounter (Signed)
Pt has appt for July. He is requesting a refill on  Ranitidine. He is completely out. He is not understanding why it keeps getting denied. Also stated that this is a prescription that Dr Sanda Klein has prescribed. Please send to [facility]

## 2019-03-14 NOTE — Telephone Encounter (Signed)
Pt.notified

## 2019-04-13 ENCOUNTER — Telehealth: Payer: Self-pay

## 2019-04-13 NOTE — Telephone Encounter (Signed)
Copied from [place] [id number]. Topic: General - Inquiry >> Apr 13, 2019  8:30 AM Wayne Flowers, NT wrote: Reason for CRM: Pharmacy is calling in stating that, famotidine (PEPCID) 20 MG tablet, is no longer available. Also wants to inform Dr.Lada that the H2 blockers are not available either due to the FDA possibly removing them from the shelves. States an alternative is needed.

## 2019-04-16 MED ORDER — CIMETIDINE 800 MG PO TABS
800.0000 mg | ORAL_TABLET | Freq: Every day | ORAL | 2 refills | Status: DC
Start: 2019-04-16 — End: 2019-06-04

## 2019-04-16 NOTE — Telephone Encounter (Signed)
H2blockers are no longer available at his pharmacy. He can take famotidne 20mg  daily from another pharamcy or try a PPI such as omeprazole 20mg  daily- just as needed for acid reflux. These medicines are over the counter, if he would like it sent as an RX I am happy to do so.  Try to limit or avoid triggers like coffee, caffeinated beverages, onions, chocolate, spicy foods, peppermint, acid foods like pizza, spaghetti sauce, and orange juice Lose weight if you are overweight or obese Try elevating the head of your bed by placing a small wedge between your mattress and box springs to keep acid in the stomach at night instead of coming up into your esophagus

## 2019-04-16 NOTE — Telephone Encounter (Signed)
He can trial cimetidine, if he does not want to try a proton pump inhibitor. Sent to [facility].

## 2019-04-16 NOTE — Addendum Note (Signed)
Addended by: Fredderick Severance on: 04/16/2019 04:32 PM   Modules accepted: Orders

## 2019-04-16 NOTE — Telephone Encounter (Signed)
Patient stated he has tried the other options before and it has not helped. Also he stated that he cannot take Omeprazole b/c it leads to memory loss.  He stated that the pharmacist made it seem like there is a shortage of Pepcid.  Please advise.

## 2019-05-01 ENCOUNTER — Other Ambulatory Visit: Payer: Self-pay | Admitting: Family Medicine

## 2019-05-01 DIAGNOSIS — E119 Type 2 diabetes mellitus without complications: Secondary | ICD-10-CM

## 2019-05-31 ENCOUNTER — Other Ambulatory Visit: Payer: Self-pay | Admitting: Family Medicine

## 2019-06-04 ENCOUNTER — Other Ambulatory Visit: Payer: Self-pay

## 2019-06-04 ENCOUNTER — Ambulatory Visit: Payer: 59 | Admitting: Family Medicine

## 2019-06-04 ENCOUNTER — Ambulatory Visit: Payer: 59 | Admitting: Nurse Practitioner

## 2019-06-04 ENCOUNTER — Encounter: Payer: Self-pay | Admitting: Nurse Practitioner

## 2019-06-04 VITALS — BP 120/78 | HR 81 | Temp 97.8°F | Resp 14 | Ht 69.0 in | Wt 239.4 lb

## 2019-06-04 DIAGNOSIS — N529 Male erectile dysfunction, unspecified: Secondary | ICD-10-CM

## 2019-06-04 DIAGNOSIS — J309 Allergic rhinitis, unspecified: Secondary | ICD-10-CM

## 2019-06-04 DIAGNOSIS — E785 Hyperlipidemia, unspecified: Secondary | ICD-10-CM

## 2019-06-04 DIAGNOSIS — E119 Type 2 diabetes mellitus without complications: Secondary | ICD-10-CM

## 2019-06-04 DIAGNOSIS — Z9989 Dependence on other enabling machines and devices: Secondary | ICD-10-CM

## 2019-06-04 DIAGNOSIS — Z23 Encounter for immunization: Secondary | ICD-10-CM | POA: Diagnosis not present

## 2019-06-04 DIAGNOSIS — G4733 Obstructive sleep apnea (adult) (pediatric): Secondary | ICD-10-CM | POA: Diagnosis not present

## 2019-06-04 DIAGNOSIS — R0982 Postnasal drip: Secondary | ICD-10-CM

## 2019-06-04 DIAGNOSIS — K219 Gastro-esophageal reflux disease without esophagitis: Secondary | ICD-10-CM | POA: Diagnosis not present

## 2019-06-04 DIAGNOSIS — Z5181 Encounter for therapeutic drug level monitoring: Secondary | ICD-10-CM

## 2019-06-04 DIAGNOSIS — I10 Essential (primary) hypertension: Secondary | ICD-10-CM | POA: Diagnosis not present

## 2019-06-04 MED ORDER — LISINOPRIL 10 MG PO TABS: 10.0000 mg | ORAL_TABLET | Freq: Every day | ORAL | 1 refills | Status: DC

## 2019-06-04 MED ORDER — FAMOTIDINE 20 MG PO TABS: 20.0000 mg | ORAL_TABLET | Freq: Two times a day (BID) | ORAL | 1 refills | Status: DC | PRN

## 2019-06-04 MED ORDER — TRULICITY 1.5 MG/0.5ML ~~LOC~~ SOAJ: SUBCUTANEOUS | 1 refills | Status: DC

## 2019-06-04 MED ORDER — ATORVASTATIN CALCIUM 40 MG PO TABS: 40.0000 mg | ORAL_TABLET | Freq: Every day | ORAL | 1 refills | Status: DC

## 2019-06-04 MED ORDER — SILDENAFIL CITRATE 20 MG PO TABS: ORAL_TABLET | ORAL | 5 refills | Status: DC

## 2019-06-04 MED ORDER — FLUTICASONE PROPIONATE 50 MCG/ACT NA SUSP: NASAL | 3 refills | Status: DC

## 2019-06-04 NOTE — Patient Instructions (Addendum)
Foods and drinks to limit include  . fried foods and other foods high in saturated fat and trans fat  . foods high in salt, also called sodium  . sweets, such as baked goods, candy, and ice cream  . beverages with added sugars, such as juice, regular soda, and regular sports or energy drinks  Drink water instead of sweetened beverages. Consider using a sugar substitute in your coffee or tea.   Instead, eat carbohydrates from fruit, vegetables, whole grains, beans, and low-fat or nonfat milk. Choose healthy carbohydrates, such as fruit, vegetables, whole grains, beans, and low-fat milk, as part of your diabetes meal plan.   Food Choices for Gastroesophageal Reflux Disease, Adult When you have gastroesophageal reflux disease (GERD), the foods you eat and your eating habits are very important. Choosing the right foods can help ease the discomfort of GERD. Consider working with a diet and nutrition specialist (dietitian) to help you make healthy food choices. What general guidelines should I follow?  Eating plan  Choose healthy foods low in fat, such as fruits, vegetables, whole grains, low-fat dairy products, and lean meat, fish, and poultry.  Eat frequent, small meals instead of three large meals each day. Eat your meals slowly, in a relaxed setting. Avoid bending over or lying down until 2-3 hours after eating.  Limit high-fat foods such as fatty meats or fried foods.  Limit your intake of oils, butter, and shortening to less than 8 teaspoons each day.  Avoid the following: ? Foods that cause symptoms. These may be different for different people. Keep a food diary to keep track of foods that cause symptoms. ? Alcohol. ? Drinking large amounts of liquid with meals. ? Eating meals during the 2-3 hours before bed.  Cook foods using methods other than frying. This may include baking, grilling, or broiling. Lifestyle  Maintain a healthy weight. Ask your health care provider what weight is  healthy for you. If you need to lose weight, work with your health care provider to do so safely.  Exercise for at least 30 minutes on 5 or more days each week, or as told by your health care provider.  Avoid wearing clothes that fit tightly around your waist and chest.  Do not use any products that contain nicotine or tobacco, such as cigarettes and e-cigarettes. If you need help quitting, ask your health care provider.  Sleep with the head of your bed raised. Use a wedge under the mattress or blocks under the bed frame to raise the head of the bed. What foods are not recommended? The items listed may not be a complete list. Talk with your dietitian about what dietary choices are best for you. Grains Pastries or quick breads with added fat. [place] toast. Vegetables Deep fried vegetables. [place] fries. Any vegetables prepared with added fat. Any vegetables that cause symptoms. For some people this may include tomatoes and tomato products, chili peppers, onions and garlic, and horseradish. Fruits Any fruits prepared with added fat. Any fruits that cause symptoms. For some people this may include citrus fruits, such as oranges, grapefruit, pineapple, and lemons. Meats and other protein foods High-fat meats, such as fatty beef or pork, hot dogs, ribs, ham, sausage, salami and bacon. Fried meat or protein, including fried fish and fried chicken. Nuts and nut butters. Dairy Whole milk and chocolate milk. Sour cream. Cream. Ice cream. Cream cheese. Milk shakes. Beverages Coffee and tea, with or without caffeine. Carbonated beverages. Sodas. Energy drinks. Fruit juice made  with acidic fruits (such as orange or grapefruit). Tomato juice. Alcoholic drinks. Fats and oils Butter. Margarine. Shortening. Ghee. Sweets and desserts Chocolate and cocoa. Donuts. Seasoning and other foods Pepper. Peppermint and spearmint. Any condiments, herbs, or seasonings that cause symptoms. For some people, this may  include curry, hot sauce, or vinegar-based salad dressings. Summary  When you have gastroesophageal reflux disease (GERD), food and lifestyle choices are very important to help ease the discomfort of GERD.  Eat frequent, small meals instead of three large meals each day. Eat your meals slowly, in a relaxed setting. Avoid bending over or lying down until 2-3 hours after eating.  Limit high-fat foods such as fatty meat or fried foods. This information is not intended to replace advice given to you by your health care provider. Make sure you discuss any questions you have with your health care provider. Document Released: 11/15/2005 Document Revised: 03/08/2019 Document Reviewed: 11/16/2016 Elsevier Patient Education  2020 [employer].

## 2019-06-04 NOTE — Progress Notes (Signed)
Name: Wayne Flowers   MRN: [id number]    DOB: [birth year]   Date:06/04/2019       Progress Note  Subjective  Chief Complaint  Chief Complaint  Patient presents with  . Follow-up    HPI  Hypertension Patient is on lisinopril 10mg  daily.  Takes medications as prescribed with no missed doses a month.  Relatively compliant with low-salt diet.  Has erectile dysfunction, takes sildenafil PRN  Denies chest pain, headaches, blurry vision. BP Readings from Last 3 Encounters:  06/04/19 120/78  11/17/18 132/82  05/17/18 122/72    Hyperlipidemia Patient rx atorvastatin 40mg  Takes medications as prescribed with no missed doses a month.  Diet: fried foods- maybe once or twice a week.  Denies myalgias Lab Results  Component Value Date   CHOL 119 11/17/2018   HDL 41 11/17/2018   LDLCALC 62 11/17/2018   TRIG 82 11/17/2018   CHOLHDL 2.9 11/17/2018    Diabetes Mellitus Patient is prescribed trulicty 1.5mg  weekly.  Diet: past year has been trying to cut out red meat; eats burgers maybe once a month when it was 3x week. Does eat a lot of desserts.  Denies polyphagia, polydipsia, polyuria.  Lab Results  Component Value Date   HGBA1C 6.6 (H) 11/17/2018     GERD He was taking ranitidine for GERD and switched to famotidine but it doesn't work as well for him. States red sauces trigger it for him, causes cough. Tends to eat a big meal before bed.   Takes flonase daily for allergies. PHQ2/9: Depression screen [facility] 2/9 06/04/2019 11/17/2018 05/17/2018 02/28/2018 09/08/2017  Decreased Interest 0 0 0 0 0  Down, Depressed, Hopeless 0 0 0 0 0  PHQ - 2 Score 0 0 0 0 0  Altered sleeping 0 0 - - -  Tired, decreased energy 0 0 - - -  Change in appetite 0 0 - - -  Feeling bad or failure about yourself  0 0 - - -  Trouble concentrating 0 0 - - -  Moving slowly or fidgety/restless 0 0 - - -  Suicidal thoughts 0 0 - - -  PHQ-9 Score 0 0 - - -  Difficult doing work/chores Not difficult at all  Not difficult at all - - -     PHQ reviewed. Negative  Patient Active Problem List   Diagnosis Date Noted  . Morbid obesity ([place]) 09/08/2017  . Hearing loss 03/11/2017  . Hyperlipidemia LDL goal <70 07/19/2016  . Medication monitoring encounter 07/19/2016  . Type 2 diabetes mellitus ([place]) 06/23/2016  . Allergic rhinitis with postnasal drip 12/22/2015  . Obstructive sleep apnea treated with continuous positive airway pressure (CPAP) 06/16/2015  . Hypertension goal BP (blood pressure) < 140/90 06/16/2015  . GERD without esophagitis 06/16/2015  . Stiffness of right shoulder joint 06/16/2015  . 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 Low libido 08/22/2013  . Right upper quadrant pain 02/19/2013    Past Medical History:  Diagnosis Date  . Allergy   . Atelectasis   . Chronic kidney disease    KIDNEY STONE  . ED (erectile dysfunction)   . ED (erectile dysfunction)   . GERD (gastroesophageal reflux disease)   . Hearing loss    right  . History of renal calculi 02/19/2013  . Hypertension   . Kidney stone 02/19/2013  . Metabolic syndrome   . Morbid obesity ([place]) 09/08/2017   BMI >35 plus diabetes  . Obesity, Class I, BMI 30.0-34.9 (see  actual BMI)   . Pleurisy   . PONV (postoperative nausea and vomiting)   . Sleep apnea    HAS NOT USED CPAP IN 6 MONTHS  . Type 2 diabetes mellitus ([place]) 06/23/2016    Past Surgical History:  Procedure Laterality Date  . CYSTOSCOPY W/ RETROGRADES Left 09/16/2015   Procedure: CYSTOSCOPY WITH RETROGRADE PYELOGRAM;  Surgeon: Royston Cowper, MD;  Location: [facility] ORS;  Service: Urology;  Laterality: Left;  . CYSTOSCOPY WITH URETEROSCOPY AND STENT PLACEMENT    . URETEROSCOPY WITH HOLMIUM LASER LITHOTRIPSY Left 09/16/2015   Procedure: URETEROSCOPY WITH HOLMIUM LASER LITHOTRIPSY;  Surgeon: Royston Cowper, MD;  Location: [facility] ORS;  Service: Urology;  Laterality: Left;  Marland Kitchen VASECTOMY      Social History   Tobacco Use  . Smoking  status: Never Smoker  . Smokeless tobacco: Never Used  Substance Use Topics  . Alcohol use: Yes    Alcohol/week: 0.0 - 1.0 standard drinks    Comment: rarley     Current Outpatient Medications:  .  atorvastatin (LIPITOR) 40 MG tablet, TAKE 1 TABLET BY MOUTH AT BEDTIME, Disp: 90 tablet, Rfl: 1 .  cimetidine (TAGAMET) 800 MG tablet, Take 1 tablet (800 mg total) by mouth at bedtime., Disp: 30 tablet, Rfl: 2 .  famotidine (PEPCID) 20 MG tablet, Take 1 tablet (20 mg total) by mouth 2 (two) times daily as needed for heartburn or indigestion., Disp: 60 tablet, Rfl: 2 .  fluticasone (FLONASE) 50 MCG/ACT nasal spray, USE 2 SPRAYS INTO EACH NOSTRIL ONCE DAILY, Disp: 48 g, Rfl: 3 .  lisinopril (PRINIVIL,ZESTRIL) 10 MG tablet, TAKE 1 TABLET BY MOUTH ONCE DAILY, Disp: 90 tablet, Rfl: 3 .  sildenafil (REVATIO) 20 MG tablet, TAKE 3 TO 5 TABLETS DAILY AS NEEDED FOR ERECTILE DYSFUNCTION, Disp: 40 tablet, Rfl: 5 .  TRULICITY 1.5 WI/0.9BD SOPN, INJECT 0.5 ML SUBQ ONCE A WEEK, Disp: 2 mL, Rfl: 1  Allergies  Allergen Reactions  . Omeprazole Other (See Comments)    Reaction:  Memory loss     ROS    No other specific complaints in a complete review of systems (except as listed in HPI above).  Objective  Vitals:   06/04/19 0820  BP: 120/78  Pulse: 81  Resp: 14  Temp: 97.8 F (36.6 C)  TempSrc: Temporal  SpO2: 98%  Weight: 239 lb 6.4 oz (108.6 kg)  Height: 5\' 9"  (1.753 m)     Body mass index is 35.35 kg/m.  Nursing Note and Vital Signs reviewed.  Physical Exam Vitals signs reviewed.  Constitutional:      Appearance: He is well-developed.  HENT:     Head: Normocephalic and atraumatic.  Neck:     Musculoskeletal: Normal range of motion and neck supple.     Vascular: No carotid bruit.  Cardiovascular:     Heart sounds: Normal heart sounds.  Pulmonary:     Effort: Pulmonary effort is normal.     Breath sounds: Normal breath sounds.  Abdominal:     General: Bowel sounds are  normal.     Palpations: Abdomen is soft.     Tenderness: There is no abdominal tenderness.  Musculoskeletal: Normal range of motion.  Skin:    General: Skin is warm and dry.     Capillary Refill: Capillary refill takes less than 2 seconds.  Neurological:     Mental Status: He is alert and oriented to person, place, and time.     GCS: GCS eye subscore is 4.  GCS verbal subscore is 5. GCS motor subscore is 6.     Sensory: No sensory deficit.  Psychiatric:        Speech: Speech normal.        Behavior: Behavior normal.        Thought Content: Thought content normal.        Judgment: Judgment normal.        No results found for this or any previous visit (from the past 48 hour(s)).  Assessment & Plan 1. Hypertension goal BP (blood pressure) < 140/90 stable - COMPLETE METABOLIC PANEL WITH GFR - lisinopril (ZESTRIL) 10 MG tablet; Take 1 tablet (10 mg total) by mouth daily.  Dispense: 90 tablet; Refill: 1  2. Obstructive sleep apnea treated with continuous positive airway pressure (CPAP) Does not wear CPAP- encouraged for weight loss and CPAP   3. GERD without esophagitis Discussed small meals, avoiding triggers - famotidine (PEPCID) 20 MG tablet; Take 1 tablet (20 mg total) by mouth 2 (two) times daily as needed for heartburn or indigestion.  Dispense: 180 tablet; Refill: 1  4. Type 2 diabetes mellitus without complication, without long-term current use of insulin (HCC) Discussed diet, foot checks, annual eye exam  - HgB A1c - Dulaglutide (TRULICITY) 1.5 DJ/2.4QA SOPN; INJECT 0.5 ML SUBQ ONCE A WEEK  Dispense: 2 mL; Refill: 1  5. ED (erectile dysfunction) of organic origin - sildenafil (REVATIO) 20 MG tablet; TAKE 3 TO 5 TABLETS DAILY AS NEEDED FOR ERECTILE DYSFUNCTION  Dispense: 40 tablet; Refill: 5  6. Hyperlipidemia LDL goal <70 - Lipid Profile - atorvastatin (LIPITOR) 40 MG tablet; Take 1 tablet (40 mg total) by mouth at bedtime.  Dispense: 90 tablet; Refill: 1  7.  Medication monitoring encounter - COMPLETE METABOLIC PANEL WITH GFR  8. Allergic rhinitis with postnasal drip - fluticasone (FLONASE) 50 MCG/ACT nasal spray; USE 2 SPRAYS INTO EACH NOSTRIL ONCE DAILY  Dispense: 48 g; Refill: 3  9. Morbid obesity (HCC) BMI >35 + DM2, HTN, HLD, GERD. Discussed diet and portion control.   10. Need for tetanus booster - Td : Tetanus/diphtheria >7yo Preservative  free    -Red flags and when to present for emergency care or RTC including fever >101.17F, chest pain, shortness of breath, new/worsening/un-resolving symptoms, reviewed with patient at time of visit. Follow up and care instructions discussed and provided in AVS. -Reviewed Health Maintenance: updated tetanus

## 2019-06-05 LAB — HEMOGLOBIN A1C
Hgb A1c MFr Bld: 6.6 % of total Hgb — ABNORMAL HIGH (ref <5.7)
Mean Plasma Glucose: 143 (calc)
eAG (mmol/L): 7.9 (calc)

## 2019-06-05 LAB — COMPLETE METABOLIC PANEL WITH GFR
AG Ratio: 2.1 (calc) (ref 1.0–2.5)
ALT: 35 U/L (ref 9–46)
AST: 19 U/L (ref 10–40)
Albumin: 4.4 g/dL (ref 3.6–5.1)
Alkaline phosphatase (APISO): 56 U/L (ref 36–130)
BUN: 17 mg/dL (ref 7–25)
CO2: 26 mmol/L (ref 20–32)
Calcium: 9.2 mg/dL (ref 8.6–10.3)
Chloride: 106 mmol/L (ref 98–110)
Creat: 0.93 mg/dL (ref 0.60–1.35)
GFR, Est African American: 115 mL/min/{1.73_m2} (ref >=60)
GFR, Est Non African American: 99 mL/min/{1.73_m2} (ref >=60)
Globulin: 2.1 g/dL (calc) (ref 1.9–3.7)
Glucose, Bld: 122 mg/dL — ABNORMAL HIGH (ref 65–99)
Potassium: 4.5 mmol/L (ref 3.5–5.3)
Sodium: 140 mmol/L (ref 135–146)
Total Bilirubin: 0.5 mg/dL (ref 0.2–1.2)
Total Protein: 6.5 g/dL (ref 6.1–8.1)

## 2019-06-05 LAB — LIPID PANEL
Cholesterol: 126 mg/dL (ref <200)
HDL: 37 mg/dL — ABNORMAL LOW (ref >=40)
LDL Cholesterol (Calc): 70 mg/dL (calc)
Non-HDL Cholesterol (Calc): 89 mg/dL (calc) (ref <130)
Total CHOL/HDL Ratio: 3.4 (calc) (ref <5.0)
Triglycerides: 104 mg/dL (ref <150)

## 2019-06-28 ENCOUNTER — Other Ambulatory Visit: Payer: Self-pay | Admitting: Nurse Practitioner

## 2019-06-28 DIAGNOSIS — E119 Type 2 diabetes mellitus without complications: Secondary | ICD-10-CM

## 2019-11-06 ENCOUNTER — Encounter: Payer: Self-pay | Admitting: Family Medicine

## 2019-11-06 ENCOUNTER — Ambulatory Visit (INDEPENDENT_AMBULATORY_CARE_PROVIDER_SITE_OTHER): Payer: 59 | Admitting: Family Medicine

## 2019-11-06 ENCOUNTER — Other Ambulatory Visit: Payer: Self-pay

## 2019-11-06 VITALS — BP 128/80 | HR 65 | Temp 97.3°F | Resp 18 | Ht 69.0 in | Wt 239.6 lb

## 2019-11-06 DIAGNOSIS — E119 Type 2 diabetes mellitus without complications: Secondary | ICD-10-CM

## 2019-11-06 DIAGNOSIS — Z125 Encounter for screening for malignant neoplasm of prostate: Secondary | ICD-10-CM | POA: Diagnosis not present

## 2019-11-06 DIAGNOSIS — E1169 Type 2 diabetes mellitus with other specified complication: Secondary | ICD-10-CM

## 2019-11-06 DIAGNOSIS — Z0000 Encounter for general adult medical examination without abnormal findings: Secondary | ICD-10-CM | POA: Diagnosis not present

## 2019-11-06 DIAGNOSIS — Z114 Encounter for screening for human immunodeficiency virus [HIV]: Secondary | ICD-10-CM

## 2019-11-06 DIAGNOSIS — I10 Essential (primary) hypertension: Secondary | ICD-10-CM

## 2019-11-06 DIAGNOSIS — E785 Hyperlipidemia, unspecified: Secondary | ICD-10-CM

## 2019-11-06 DIAGNOSIS — Z1159 Encounter for screening for other viral diseases: Secondary | ICD-10-CM

## 2019-11-06 NOTE — Progress Notes (Signed)
Name: Wayne Flowers   MRN: [id number]    DOB: 03-Aug-1976   Date:11/06/2019       Progress Note  Subjective  Chief Complaint  Chief Complaint  Patient presents with  . Annual Exam    HPI  Patient presents for annual CPE.  USPSTF grade A and B recommendations:  Diet: Tries to cut out sugars and red meat.  Doing okay with diet.  Exercise: Not exercising since the gyms have been closed from COVID-19.  Depression: phq 9 is negative Depression screen [facility] 2/9 11/06/2019 06/04/2019 11/17/2018 05/17/2018 02/28/2018  Decreased Interest 0 0 0 0 0  Down, Depressed, Hopeless 0 0 0 0 0  PHQ - 2 Score 0 0 0 0 0  Altered sleeping 0 0 0 - -  Tired, decreased energy 0 0 0 - -  Change in appetite 0 0 0 - -  Feeling bad or failure about yourself  0 0 0 - -  Trouble concentrating 0 0 0 - -  Moving slowly or fidgety/restless 0 0 0 - -  Suicidal thoughts 0 0 0 - -  PHQ-9 Score 0 0 0 - -  Difficult doing work/chores Not difficult at all Not difficult at all Not difficult at all - -    Hypertension:  BP Readings from Last 3 Encounters:  11/06/19 128/80  06/04/19 120/78  11/17/18 132/82    Obesity: Wt Readings from Last 3 Encounters:  11/06/19 239 lb 9.6 oz (108.7 kg)  06/04/19 239 lb 6.4 oz (108.6 kg)  11/17/18 243 lb 11.2 oz (110.5 kg)   BMI Readings from Last 3 Encounters:  11/06/19 35.38 kg/m  06/04/19 35.35 kg/m  11/17/18 35.99 kg/m     Lipids:  Lab Results  Component Value Date   CHOL 126 06/04/2019   CHOL 119 11/17/2018   CHOL 124 05/17/2018   Lab Results  Component Value Date   HDL 37 (L) 06/04/2019   HDL 41 11/17/2018   HDL 39 (L) 05/17/2018   Lab Results  Component Value Date   LDLCALC 70 06/04/2019   LDLCALC 62 11/17/2018   LDLCALC 68 05/17/2018   Lab Results  Component Value Date   TRIG 104 06/04/2019   TRIG 82 11/17/2018   TRIG 88 05/17/2018   Lab Results  Component Value Date   CHOLHDL 3.4 06/04/2019   CHOLHDL 2.9 11/17/2018   CHOLHDL 3.2  05/17/2018   No results found for: LDLDIRECT Glucose:  Glucose, Bld  Date Value Ref Range Status  06/04/2019 122 (H) 65 - 99 mg/dL Final    Comment:    .            Fasting reference interval . For someone without known diabetes, a glucose value between 100 and 125 mg/dL is consistent with prediabetes and should be confirmed with a follow-up test. .   11/17/2018 114 (H) 65 - 99 mg/dL Final    Comment:    .            Fasting reference interval . For someone without known diabetes, a glucose value between 100 and 125 mg/dL is consistent with prediabetes and should be confirmed with a follow-up test. .   05/17/2018 100 (H) 65 - 99 mg/dL Final    Comment:    .            Fasting reference interval . For someone without known diabetes, a glucose value between 100 and 125 mg/dL is consistent with prediabetes and should be  confirmed with a follow-up test. .    Glucose-Capillary  Date Value Ref Range Status  09/16/2015 99 65 - 99 mg/dL Final      [facility]06/[facility]  AUDIT-C Score  1    Special occasions only - vacation, holidays  Married STD testing and prevention (HIV/chl/gon/syphilis): Declines; will check HIV today Hep C: We will check today  Skin cancer: Discussed monitoring for atypical lesions.  Goes annually to dermatologist once annually.  Colorectal cancer: Discussed new recommendations from USPSTF to start colonoscopy at 44yo - will place orders today.  PGM with history of colon cancer.  Denies personal history of colorectal cancer, no changes in BM's - no blood in stool, dark and tarry stool, mucus in stool, or constipation/diarrhea.  Prostate cancer: No family history No results found for: PSA  IPSS Questionnaire (AUA-7): Over the past month.   1)  How often have you had a sensation of not emptying your bladder completely after you finish urinating?  0 - Not at all  2)  How often have you had to urinate  again less than two hours after you finished urinating? 4 - More than half the time  3)  How often have you found you stopped and started again several times when you urinated?  0 - Not at all  4) How difficult have you found it to postpone urination?  0 - Not at all  5) How often have you had a weak urinary stream?  0 - Not at all  6) How often have you had to push or strain to begin urination?  0 - Not at all  7) How many times did you most typically get up to urinate from the time you went to bed until the time you got up in the morning?  0 - None  Total score:  0-7 mildly symptomatic   8-19 moderately symptomatic   20-35 severely symptomatic  Score of 4 - low;   Lung cancer:  Never smoker Low Dose CT Chest recommended if Age 44-80 years, 30 pack-year currently smoking OR have quit w/in 15years. Patient does not qualify.   AAA: N/A The USPSTF recommends one-time screening with ultrasonography in men ages 10 to 55 years who have ever smoked ECG:  Denies chest pain, shortness of breath, palpitations.  Advanced Care Planning: A voluntary discussion about advance care planning including the explanation and discussion of advance directives.  Discussed health care proxy and Living will, and the patient was able to identify a health care proxy as Joyice Faster.  Patient does not have a living will at present time. If patient does have living will, I have requested they bring this to the clinic to be scanned in to their chart.  Patient Active Problem List   Diagnosis Date Noted  . Morbid obesity ([place]) 09/08/2017  . Hearing loss 03/11/2017  . Hyperlipidemia LDL goal <70 07/19/2016  . Medication monitoring encounter 07/19/2016  . Type 2 diabetes mellitus ([place]) 06/23/2016  . Allergic rhinitis with postnasal drip 12/22/2015  . Obstructive sleep apnea treated with continuous positive airway pressure (CPAP) 06/16/2015  . Hypertension goal BP (blood pressure) < 140/90 06/16/2015  . GERD without  esophagitis 06/16/2015  . Stiffness of right shoulder joint 06/16/2015  . 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 Low libido 08/22/2013  . Right upper quadrant pain 02/19/2013    Past Surgical History:  Procedure Laterality Date  . CYSTOSCOPY W/ RETROGRADES  Left 09/16/2015   Procedure: CYSTOSCOPY WITH RETROGRADE PYELOGRAM;  Surgeon: Royston Cowper, MD;  Location: [facility] ORS;  Service: Urology;  Laterality: Left;  . CYSTOSCOPY WITH URETEROSCOPY AND STENT PLACEMENT    . URETEROSCOPY WITH HOLMIUM LASER LITHOTRIPSY Left 09/16/2015   Procedure: URETEROSCOPY WITH HOLMIUM LASER LITHOTRIPSY;  Surgeon: Royston Cowper, MD;  Location: [facility] ORS;  Service: Urology;  Laterality: Left;  Marland Kitchen VASECTOMY      Family History  Problem Relation Age of Onset  . Diabetes Mother   . Cancer Father        renal  . Spina bifida Sister   . Appendicitis Son   . Cancer Maternal Grandfather        black lung  . Cancer Paternal Grandmother        lung  . Diabetes Sister     Social History   Socioeconomic History  . Marital status: Married    Spouse name: Alyse Low  . Number of children: 2  . Years of education: Not on file  . Highest education level: Not on file  Occupational History  . Not on file  Social Needs  . Financial resource strain: Not hard at all  . Food insecurity    Worry: Never true    Inability: Never true  . Transportation needs    Medical: No    Non-medical: No  Tobacco Use  . Smoking status: Never Smoker  . Smokeless tobacco: Never Used  Substance and Sexual Activity  . Alcohol use: Yes    Alcohol/week: 0.0 - 1.0 standard drinks    Comment: rarley  . Drug use: No  . Sexual activity: Yes    Partners: Female  Lifestyle  . Physical activity    Days per week: 0 days    Minutes per session: 0 min  . Stress: Only a little  Relationships  . Social connections    Talks on phone: More than three times a week    Gets together: Once a week    Attends religious  service: More than 4 times per year    Active member of club or organization: Yes    Attends meetings of clubs or organizations: Never    Relationship status: Married  . Intimate partner violence    Fear of current or ex partner: No    Emotionally abused: No    Physically abused: No    Forced sexual activity: No  Other Topics Concern  . Not on file  Social History Narrative  . Not on file     Current Outpatient Medications:  .  atorvastatin (LIPITOR) 40 MG tablet, Take 1 tablet (40 mg total) by mouth at bedtime., Disp: 90 tablet, Rfl: 1 .  famotidine (PEPCID) 20 MG tablet, Take 1 tablet (20 mg total) by mouth 2 (two) times daily as needed for heartburn or indigestion., Disp: 180 tablet, Rfl: 1 .  fluticasone (FLONASE) 50 MCG/ACT nasal spray, USE 2 SPRAYS INTO EACH NOSTRIL ONCE DAILY, Disp: 48 g, Rfl: 3 .  lisinopril (ZESTRIL) 10 MG tablet, Take 1 tablet (10 mg total) by mouth daily., Disp: 90 tablet, Rfl: 1 .  sildenafil (REVATIO) 20 MG tablet, TAKE 3 TO 5 TABLETS DAILY AS NEEDED FOR ERECTILE DYSFUNCTION, Disp: 40 tablet, Rfl: 5 .  TRULICITY 1.5 0000000 SOPN, INJECT 0.5 ML SUBQ ONCE A WEEK, Disp: 6 mL, Rfl: 1  Allergies  Allergen Reactions  . Omeprazole Other (See Comments)    Reaction:  Memory loss  ROS  Constitutional: Negative for fever or weight change.  Respiratory: Negative for cough and shortness of breath.   Cardiovascular: Negative for chest pain or palpitations.  Gastrointestinal: Negative for abdominal pain, no bowel changes.  Musculoskeletal: Negative for gait problem or joint swelling.  Skin: Negative for rash.  Neurological: Negative for dizziness or headache.  No other specific complaints in a complete review of systems (except as listed in HPI above).   Objective  Vitals:   11/06/19 0801  BP: 128/80  Pulse: 65  Resp: 18  Temp: (!) 97.3 F (36.3 C)  TempSrc: Temporal  SpO2: 96%  Weight: 239 lb 9.6 oz (108.7 kg)  Height: 5\' 9"  (1.753 m)     Body mass index is 35.38 kg/m.  Physical Exam  Constitutional: Patient appears well-developed and well-nourished. No distress.  HENT: Head: Normocephalic and atraumatic. Ears: B TMs ok, no erythema or effusion; Nose: Nose normal. Mouth/Throat: Oropharynx is clear and moist. No oropharyngeal exudate.  Eyes: Conjunctivae and EOM are normal. Pupils are equal, round, and reactive to light. No scleral icterus.  Neck: Normal range of motion. Neck supple. No JVD present. No thyromegaly present.  Cardiovascular: Normal rate, regular rhythm and normal heart sounds.  No murmur heard. No BLE edema. Pulmonary/Chest: Effort normal and breath sounds normal. No respiratory distress. Abdominal: Soft. Bowel sounds are normal, no distension. There is no tenderness. no masses MALE GENITALIA: None RECTAL: None Musculoskeletal: Normal range of motion, no joint effusions. No gross deformities Neurological: he is alert and oriented to person, place, and time. No cranial nerve deficit. Coordination, balance, strength, speech and gait are normal.  Skin: Skin is warm and dry. No rash noted. No erythema.  Psychiatric: Patient has a normal mood and affect. behavior is normal. Judgment and thought content normal.  No results found for this or any previous visit (from the past 2160 hour(s)).   PHQ2/9: Depression screen [facility] 2/9 11/06/2019 06/04/2019 11/17/2018 05/17/2018 02/28/2018  Decreased Interest 0 0 0 0 0  Down, Depressed, Hopeless 0 0 0 0 0  PHQ - 2 Score 0 0 0 0 0  Altered sleeping 0 0 0 - -  Tired, decreased energy 0 0 0 - -  Change in appetite 0 0 0 - -  Feeling bad or failure about yourself  0 0 0 - -  Trouble concentrating 0 0 0 - -  Moving slowly or fidgety/restless 0 0 0 - -  Suicidal thoughts 0 0 0 - -  PHQ-9 Score 0 0 0 - -  Difficult doing work/chores Not difficult at all Not difficult at all Not difficult at all - -   Fall Risk: Fall Risk  11/06/2019 06/04/2019 11/17/2018 05/17/2018 02/28/2018   Falls in the past year? 0 0 0 No No  Number falls in past yr: 0 0 0 - -  Injury with Fall? 0 0 0 - -  Follow up Falls evaluation completed - - - -    Assessment & Plan  1. Annual physical exam -Prostate cancer screening and PSA options (with potential risks and benefits of testing vs not testing) were discussed along with recent recs/guidelines. -USPSTF grade A and B recommendations reviewed with patient; age-appropriate recommendations, preventive care, screening tests, etc discussed and encouraged; healthy living encouraged; see AVS for patient education given to patient -Discussed importance of 150 minutes of physical activity weekly, eat two servings of fish weekly, eat one serving of tree nuts ( cashews, pistachios, pecans, almonds.Marland Kitchen) every other day, eat  6 servings of fruit/vegetables daily and drink plenty of water and avoid sweet beverages.  - Hemoglobin A1c - Hepatitis C antibody - HIV Antibody (routine testing w rflx) - Lipid panel - COMPLETE METABOLIC PANEL WITH GFR - PSA  2. Prostate cancer screening - PSA  3. Hypertension goal BP (blood pressure) < 140/90 - COMPLETE METABOLIC PANEL WITH GFR  4. Type 2 diabetes mellitus without complication, without long-term current use of insulin (HCC) - Hemoglobin A1c  5. Hyperlipidemia associated with type 2 diabetes mellitus (HCC) - Lipid panel  6. Encounter for screening for HIV - HIV Antibody (routine testing w rflx)  7. Encounter for hepatitis C screening test for low risk patient - Hepatitis C antibody

## 2019-11-06 NOTE — Patient Instructions (Signed)
Preventive Care 15-45 Years Old, Male Preventive care refers to lifestyle choices and visits with your health care provider that can promote health and wellness. This includes:  A yearly physical exam. This is also called an annual well check.  Regular dental and eye exams.  Immunizations.  Screening for certain conditions.  Healthy lifestyle choices, such as eating a healthy diet, getting regular exercise, not using drugs or products that contain nicotine and tobacco, and limiting alcohol use. What can I expect for my preventive care visit? Physical exam Your health care provider will check:  Height and weight. These may be used to calculate body mass index (BMI), which is a measurement that tells if you are at a healthy weight.  Heart rate and blood pressure.  Your skin for abnormal spots. Counseling Your health care provider may ask you questions about:  Alcohol, tobacco, and drug use.  Emotional well-being.  Home and relationship well-being.  Sexual activity.  Eating habits.  Work and work Statistician. What immunizations do I need?  Influenza (flu) vaccine  This is recommended every year. Tetanus, diphtheria, and pertussis (Tdap) vaccine  You may need a Td booster every 10 years. Varicella (chickenpox) vaccine  You may need this vaccine if you have not already been vaccinated. Zoster (shingles) vaccine  You may need this after age 30. Measles, mumps, and rubella (MMR) vaccine  You may need at least one dose of MMR if you were born in 1957 or later. You may also need a second dose. Pneumococcal conjugate (PCV13) vaccine  You may need this if you have certain conditions and were not previously vaccinated. Pneumococcal polysaccharide (PPSV23) vaccine  You may need one or two doses if you smoke cigarettes or if you have certain conditions. Meningococcal conjugate (MenACWY) vaccine  You may need this if you have certain conditions. Hepatitis A vaccine   You may need this if you have certain conditions or if you travel or work in places where you may be exposed to hepatitis A. Hepatitis B vaccine  You may need this if you have certain conditions or if you travel or work in places where you may be exposed to hepatitis B. Haemophilus influenzae type b (Hib) vaccine  You may need this if you have certain risk factors. Human papillomavirus (HPV) vaccine  If recommended by your health care provider, you may need three doses over 6 months. You may receive vaccines as individual doses or as more than one vaccine together in one shot (combination vaccines). Talk with your health care provider about the risks and benefits of combination vaccines. What tests do I need? Blood tests  Lipid and cholesterol levels. These may be checked every 5 years, or more frequently if you are over 34 years old.  Hepatitis C test.  Hepatitis B test. Screening  Lung cancer screening. You may have this screening every year starting at age 28 if you have a 30-pack-year history of smoking and currently smoke or have quit within the past 15 years.  Prostate cancer screening. Recommendations will vary depending on your family history and other risks.  Colorectal cancer screening. All adults should have this screening starting at age 37 and continuing until age 50. Your health care provider may recommend screening at age 45 if you are at increased risk. You will have tests every 1-10 years, depending on your results and the type of screening test.  Diabetes screening. This is done by checking your blood sugar (glucose) after you have not eaten  for a while (fasting). You may have this done every 1-3 years.  Sexually transmitted disease (STD) testing. Follow these instructions at home: Eating and drinking  Eat a diet that includes fresh fruits and vegetables, whole grains, lean protein, and low-fat dairy products.  Take vitamin and mineral supplements as recommended  by your health care provider.  Do not drink alcohol if your health care provider tells you not to drink.  If you drink alcohol: ? Limit how much you have to 0-2 drinks a day. ? Be aware of how much alcohol is in your drink. In the [place] one drink equals one 12 oz bottle of beer (355 mL), one 5 oz glass of wine (148 mL), or one 1 oz glass of hard liquor (44 mL). Lifestyle  Take daily care of your teeth and gums.  Stay active. Exercise for at least 30 minutes on 5 or more days each week.  Do not use any products that contain nicotine or tobacco, such as cigarettes, e-cigarettes, and chewing tobacco. If you need help quitting, ask your health care provider.  If you are sexually active, practice safe sex. Use a condom or other form of protection to prevent STIs (sexually transmitted infections).  Talk with your health care provider about taking a low-dose aspirin every day starting at age 50. What's next?  Go to your health care provider once a year for a well check visit.  Ask your health care provider how often you should have your eyes and teeth checked.  Stay up to date on all vaccines. This information is not intended to replace advice given to you by your health care provider. Make sure you discuss any questions you have with your health care provider. Document Released: 12/12/2015 Document Revised: 11/09/2018 Document Reviewed: 11/09/2018 Elsevier Patient Education  2020 [employer] Inc[employer]   

## 2019-11-07 LAB — COMPLETE METABOLIC PANEL WITH GFR
AG Ratio: 1.8 (calc) (ref 1.0–2.5)
ALT: 46 U/L (ref 9–46)
AST: 25 U/L (ref 10–40)
Albumin: 4.8 g/dL (ref 3.6–5.1)
Alkaline phosphatase (APISO): 74 U/L (ref 36–130)
BUN: 15 mg/dL (ref 7–25)
CO2: 26 mmol/L (ref 20–32)
Calcium: 9.8 mg/dL (ref 8.6–10.3)
Chloride: 104 mmol/L (ref 98–110)
Creat: 0.93 mg/dL (ref 0.60–1.35)
GFR, Est African American: 114 mL/min/{1.73_m2} (ref >=60)
GFR, Est Non African American: 99 mL/min/{1.73_m2} (ref >=60)
Globulin: 2.7 g/dL (calc) (ref 1.9–3.7)
Glucose, Bld: 114 mg/dL — ABNORMAL HIGH (ref 65–99)
Potassium: 4.9 mmol/L (ref 3.5–5.3)
Sodium: 140 mmol/L (ref 135–146)
Total Bilirubin: 0.4 mg/dL (ref 0.2–1.2)
Total Protein: 7.5 g/dL (ref 6.1–8.1)

## 2019-11-07 LAB — LIPID PANEL
Cholesterol: 118 mg/dL (ref <200)
HDL: 43 mg/dL (ref >=40)
LDL Cholesterol (Calc): 61 mg/dL (calc)
Non-HDL Cholesterol (Calc): 75 mg/dL (calc) (ref <130)
Total CHOL/HDL Ratio: 2.7 (calc) (ref <5.0)
Triglycerides: 64 mg/dL (ref <150)

## 2019-11-07 LAB — HEPATITIS C ANTIBODY
Hepatitis C Ab: NONREACTIVE
SIGNAL TO CUT-OFF: 0.01 (ref <1.00)

## 2019-11-07 LAB — HEMOGLOBIN A1C
Hgb A1c MFr Bld: 6.8 % of total Hgb — ABNORMAL HIGH (ref <5.7)
Mean Plasma Glucose: 148 (calc)
eAG (mmol/L): 8.2 (calc)

## 2019-11-07 LAB — PSA: PSA: 0.4 ng/mL (ref <=4.0)

## 2019-11-07 LAB — HIV ANTIBODY (ROUTINE TESTING W REFLEX): HIV 1&2 Ab, 4th Generation: NONREACTIVE

## 2019-11-12 ENCOUNTER — Ambulatory Visit (INDEPENDENT_AMBULATORY_CARE_PROVIDER_SITE_OTHER): Payer: 59 | Admitting: Family Medicine

## 2019-11-12 ENCOUNTER — Encounter: Payer: Self-pay | Admitting: Family Medicine

## 2019-11-12 ENCOUNTER — Ambulatory Visit: Payer: Self-pay | Admitting: *Deleted

## 2019-11-12 ENCOUNTER — Other Ambulatory Visit: Payer: Self-pay

## 2019-11-12 VITALS — Ht 69.0 in | Wt 239.0 lb

## 2019-11-12 DIAGNOSIS — R112 Nausea with vomiting, unspecified: Secondary | ICD-10-CM | POA: Diagnosis not present

## 2019-11-12 DIAGNOSIS — R1013 Epigastric pain: Secondary | ICD-10-CM | POA: Diagnosis not present

## 2019-11-12 DIAGNOSIS — R197 Diarrhea, unspecified: Secondary | ICD-10-CM | POA: Diagnosis not present

## 2019-11-12 DIAGNOSIS — R140 Abdominal distension (gaseous): Secondary | ICD-10-CM

## 2019-11-12 MED ORDER — DICYCLOMINE HCL 20 MG PO TABS: 20.0000 mg | ORAL_TABLET | Freq: Three times a day (TID) | ORAL | 1 refills | Status: DC | PRN

## 2019-11-12 NOTE — Telephone Encounter (Signed)
Pt called in c/o abd pain "below my chest and radiating to my back".   I had nausea and vomiting on Monday that went away but the diarrhea which also started Monday has continued also with the pain.  See triage notes.  I did the screen questions for COVID-19.   Since he is having diarrhea I scheduled him a virtual visit with Delsa Grana for 8:40 AM this morning.   I let him know to be ready to accept the call at 8:15.   He was agreeable to this.   I went over the care advice when to go to the ED.   He verbalized understanding.  I sent my notes to the office.   Reason for Disposition . [1] MILD-MODERATE pain AND [2] constant AND [3] present > 2 hours  Answer Assessment - Initial Assessment Questions 1. LOCATION: "Where does it hurt?"      Hurts below chest.   Painful to touch.    Last Monday started with nausea and vomiting and diarrhea.   N/V went away but diarrhea continues.   I'm having a lot of gas pain.   Gas medications help a little but the gas comes back and I feel full.    I feel the pain when I walk or laying down.    2. RADIATION: "Does the pain shoot anywhere else?" (e.g., chest, back)     Radiates to my back.   It's like pressure in my lower back. 3. ONSET: "When did the pain begin?" (Minutes, hours or days ago)      Last Monday 4. SUDDEN: "Gradual or sudden onset?"     All of sudden.    5. PATTERN "Does the pain come and go, or is it constant?"    - If constant: "Is it getting better, staying the same, or worsening?"      (Note: Constant means the pain never goes away completely; most serious pain is constant and it progresses)     - If intermittent: "How long does it last?" "Do you have pain now?"     (Note: Intermittent means the pain goes away completely between bouts)     I took 2 gas X and a stool softener last night.    Helps but during the day the pain builds until by night it really hurts. 6. SEVERITY: "How bad is the pain?"  (e.g., Scale 1-10; mild, moderate, or  severe)    - MILD (1-3): doesn't interfere with normal activities, abdomen soft and not tender to touch     - MODERATE (4-7): interferes with normal activities or awakens from sleep, tender to touch     - SEVERE (8-10): excruciating pain, doubled over, unable to do any normal activities       8 7. RECURRENT SYMPTOM: "Have you ever had this type of abdominal pain before?" If so, ask: "When was the last time?" and "What happened that time?"      No  8. CAUSE: "What do you think is causing the abdominal pain?"     A stomach but maybe 9. RELIEVING/AGGRAVATING FACTORS: "What makes it better or worse?" (e.g., movement, antacids, bowel movement)     It builds during the day.   Gas medicines nelps a little 10. OTHER SYMPTOMS: "Has there been any vomiting, diarrhea, constipation, or urine problems?"       No urinary problems, diarrhea.   No solid stool since Monday.   I feel like if I could have a good  BM the gas would go away.    No abd surgery history.  Protocols used: ABDOMINAL PAIN - MALE-A-AH

## 2019-11-12 NOTE — Progress Notes (Signed)
Name: Wayne Flowers   MRN: [id number]    DOB: 1976/06/19   Date:11/12/2019       Progress Note  Subjective:   Chief Complaint  Chief Complaint  Patient presents with  . GI Problem    since last tuesday had abdominal pain, diarrhea, nausea and vomiting.  Vomiting has stopped but still have abdominal pain and diarrhea.    I connected with  Aristotelis Giovanelli  on 11/12/19 at  2:40 PM EST by a video enabled telemedicine application and verified that I am speaking with the correct person using two identifiers.  I discussed the limitations of evaluation and management by telemedicine and the availability of in person appointments. The patient expressed understanding and agreed to proceed. Staff also discussed with the patient that there may be a patient responsible charge related to this service. Patient Location: home Provider Location: [facility]  Additional Individuals present: none  GI Problem The primary symptoms include fatigue, abdominal pain, nausea, vomiting and diarrhea. Primary symptoms do not include fever, weight loss, melena, hematemesis, jaundice, hematochezia, dysuria, myalgias, arthralgias or rash. Episode onset: 6 days ago onset of N/V/D. The onset was sudden. The problem has been gradually improving.  The illness does not include anorexia or constipation. Significant associated medical issues include GERD. Associated medical issues do not include gallstones, PUD or irritable bowel syndrome.  Abdominal Pain This is a new problem. The current episode started in the past 7 days. The onset quality is gradual. The problem occurs constantly (constant achy pain upper central and then also crampy and sharp pains that come and go). The most recent episode lasted 6 days. The problem has been unchanged (abd pain mostly constant and unchanged, N/V improved and pt tolerated PO's today, both liquid and solids). The pain is located in the epigastric region. The pain is moderate.  The quality of the pain is sharp, aching, a sensation of fullness and cramping. Pain radiation: radiates to central abd and around both sides to kidney area. Associated symptoms include diarrhea, flatus, nausea and vomiting. Pertinent negatives include no anorexia, arthralgias, belching, constipation, dysuria, fever, frequency, headaches, hematochezia, hematuria, melena, myalgias or weight loss. Associated symptoms comments: Bloating, distended, rumbling, flatus. The pain is aggravated by eating (laying down). The pain is relieved by nothing. Treatments tried: stool softner, gas ex. The treatment provided mild relief. Prior workup: none. His past medical history is significant for GERD. There is no history of abdominal surgery, colon cancer, Crohn's disease, gallstones, irritable bowel syndrome, pancreatitis, PUD or ulcerative colitis.  Pt tried stool softeners and even though he had diarrhea already it did seem to help, Gas x also helped N/V/D was on day 1, he has been tolerating PO's x 5 days, but with cramping, N and bloating he has had decreased appetite. His brother in law was with him around when he got sick, he was sick with same sx, but BIL sx only lasted about 24-48 hours.  They both got tested for COVID and were negative.  No BM today, but yesterday around 8 loose to watery bm's.  No hx of diverticulitis, he denies melena/hematochezia, fever, chills, sweats, myalgias.  No near syncope - feels just a little weaker than normal.  No rash, muscle spasms.  Pt on pepcid - states that prilosec made him have memory loss - unclear if he has tried or can try other PPI's - asked him to discuss with the pharmacist - PPI may help tx epigastric pain/bloating/protect stomach  Patient  Active Problem List   Diagnosis Date Noted  . Morbid obesity ([place]) 09/08/2017  . Hearing loss 03/11/2017  . Hyperlipidemia LDL goal <70 07/19/2016  . Medication monitoring encounter 07/19/2016  . Type 2 diabetes mellitus  ([place]) 06/23/2016  . Allergic rhinitis with postnasal drip 12/22/2015  . Obstructive sleep apnea treated with continuous positive airway pressure (CPAP) 06/16/2015  . Hypertension goal BP (blood pressure) < 140/90 06/16/2015  . GERD without esophagitis 06/16/2015  . Stiffness of right shoulder joint 06/16/2015  . 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 Low libido 08/22/2013  . Right upper quadrant pain 02/19/2013    Social History   Tobacco Use  . Smoking status: Never Smoker  . Smokeless tobacco: Never Used  Substance Use Topics  . Alcohol use: Yes    Alcohol/week: 0.0 - 1.0 standard drinks    Comment: rarley     Current Outpatient Medications:  .  atorvastatin (LIPITOR) 40 MG tablet, Take 1 tablet (40 mg total) by mouth at bedtime., Disp: 90 tablet, Rfl: 1 .  famotidine (PEPCID) 20 MG tablet, Take 1 tablet (20 mg total) by mouth 2 (two) times daily as needed for heartburn or indigestion., Disp: 180 tablet, Rfl: 1 .  fluticasone (FLONASE) 50 MCG/ACT nasal spray, USE 2 SPRAYS INTO EACH NOSTRIL ONCE DAILY, Disp: 48 g, Rfl: 3 .  lisinopril (ZESTRIL) 10 MG tablet, Take 1 tablet (10 mg total) by mouth daily., Disp: 90 tablet, Rfl: 1 .  sildenafil (REVATIO) 20 MG tablet, TAKE 3 TO 5 TABLETS DAILY AS NEEDED FOR ERECTILE DYSFUNCTION, Disp: 40 tablet, Rfl: 5 .  TRULICITY 1.5 0000000 SOPN, INJECT 0.5 ML SUBQ ONCE A WEEK, Disp: 6 mL, Rfl: 1  Allergies  Allergen Reactions  . Omeprazole Other (See Comments)    Reaction:  Memory loss     I personally reviewed active problem list, medication list, allergies, family history, social history, health maintenance, notes from last encounter, lab results, imaging with the patient/caregiver today.  Review of Systems  Constitutional: Positive for fatigue. Negative for fever and weight loss.  HENT: Negative.   Eyes: Negative.   Respiratory: Negative.   Cardiovascular: Negative.   Gastrointestinal: Positive for abdominal pain,  diarrhea, flatus, nausea and vomiting. Negative for anorexia, constipation, hematemesis, hematochezia, jaundice and melena.  Endocrine: Negative.   Genitourinary: Positive for decreased urine volume (slightly less urine output today). Negative for dysuria, frequency and hematuria.  Musculoskeletal: Negative.  Negative for arthralgias and myalgias.  Skin: Negative.  Negative for rash.  Allergic/Immunologic: Negative.   Neurological: Negative.  Negative for headaches.  Hematological: Negative.   Psychiatric/Behavioral: Negative.   All other systems reviewed and are negative.      Objective:   Virtual encounter, vitals limited, only able to obtain the following Today's Vitals   11/12/19 0828 11/12/19 0829  Weight: 239 lb (108.4 kg)   Height: 5\' 9"  (1.753 m)   PainSc:  8    Body mass index is 35.29 kg/m. Nursing Note and Vital Signs reviewed.  Physical Exam Vitals and nursing note reviewed.  Constitutional:      General: He is not in acute distress.    Appearance: Normal appearance. He is well-developed. He is not ill-appearing, toxic-appearing or diaphoretic.  HENT:     Head: Normocephalic and atraumatic.     Nose: Nose normal.  Eyes:     General:        Right eye: No discharge.        Left eye:  No discharge.     Conjunctiva/sclera: Conjunctivae normal.  Neck:     Trachea: No tracheal deviation.  Pulmonary:     Effort: Pulmonary effort is normal. No respiratory distress.     Breath sounds: No stridor.  Musculoskeletal:        General: Normal range of motion.  Skin:    Coloration: Skin is not jaundiced or pale.     Findings: No rash.  Neurological:     Mental Status: He is alert.     Motor: No abnormal muscle tone.     Coordination: Coordination normal.  Psychiatric:        Mood and Affect: Mood normal.        Behavior: Behavior normal.     PE limited by telephone encounter  No results found for this or any previous visit (from the past 72  hour(s)).  Assessment and Plan:     ICD-10-CM   1. Nausea vomiting and diarrhea  R11.2 CMP w GFR   R19.7 Lipase    CBC w/ Diff    DG Abd 1 View    dicyclomine (BENTYL) 20 MG tablet   likely viral etiology, N and V improved, tolerating POs x 5 d and appetite improving, diarrhea yesterday and none today, supportive tx, push fluids  2. Epigastric pain  R10.13 CMP w GFR    Lipase    CBC w/ Diff    DG Abd 1 View    dicyclomine (BENTYL) 20 MG tablet   constant x 6 days with bloating, gassiness, cramping and continued diarrhea - viral gastroenteritis? vs colitis? gastritis?   3. Abdominal bloating  R14.0    pt states sometimes abd is distended and more firm, tolerating POs' and passing gas doubt bowel obstruction/volvulus, colitis? gerd/gastritis?      Because patient endorses continued epigastric pain and distention will get KUB and some basic labs.  It is reassuring that he is tolerating p.o.'s and appetite is improving based on that alone unlikely to be acute abdomen.  We discussed how a viral etiology may continue to gradually improve and the treatment would be supportive, we discussed ER precautions which she verbalized understanding of.  Encouraged him to push clear fluids, replace electrolytes, slowly advance diet, can continue to use Gas-X and try Bentyl to help calm his GI symptoms.   We discussed how PPI might be helpful for symptoms or protect his stomach but is unclear if he is able to tolerate that due to an allergy to omeprazole, he was going to discuss with the pharmacist when picks of his other medications today.  Did encourage him to follow-up if his abdominal pain continues, we did discuss how we can do some stool studies or if he has continued abdominal pain get a CT scan, he may need antibiotics for colitis for this time hesitate to start antibiotics since his symptoms have been improving and a brother-in-law was also ill is likely to be viral and antibiotics would be of little  benefit and would likely cause worse diarrhea   -Red flags and when to present for emergency care or RTC including severe onset of worsening abd pain, abd distension with N/V or inability to tolerate PO's, abd pain with radiation to chest or back, chest pain, shortness of breath - pt instructed to go to ER. With and new/worsening/un-resolving symptoms that are not severe pt encouraged to f/up here with me or PCP if he would like -reviewed with patient at time of visit. Follow up  and care instructions discussed and provided in AVS. - I discussed the assessment and treatment plan with the patient. The patient was provided an opportunity to ask questions and all were answered. The patient agreed with the plan and demonstrated an understanding of the instructions.  I provided 21 minutes of non-face-to-face time during this encounter.  Delsa Grana, PA-C 11/12/19 9:00 AM

## 2019-11-13 ENCOUNTER — Other Ambulatory Visit
Admission: RE | Admit: 2019-11-13 | Discharge: 2019-11-13 | Disposition: A | Discharge status: Home / Self Care | Payer: 59 | Source: Home / Self Care | Attending: Family Medicine | Admitting: Family Medicine

## 2019-11-13 ENCOUNTER — Other Ambulatory Visit: Payer: Self-pay | Admitting: Family Medicine

## 2019-11-13 ENCOUNTER — Ambulatory Visit
Admission: RE | Admit: 2019-11-13 | Discharge: 2019-11-13 | Disposition: A | Discharge status: Home / Self Care | Payer: 59 | Source: Ambulatory Visit | Attending: Family Medicine | Admitting: Family Medicine

## 2019-11-13 ENCOUNTER — Ambulatory Visit
Admission: RE | Admit: 2019-11-13 | Discharge: 2019-11-13 | Disposition: A | Discharge status: Home / Self Care | Payer: 59 | Attending: Family Medicine | Admitting: Family Medicine

## 2019-11-13 DIAGNOSIS — K567 Ileus, unspecified: Secondary | ICD-10-CM

## 2019-11-13 DIAGNOSIS — R1013 Epigastric pain: Secondary | ICD-10-CM

## 2019-11-13 DIAGNOSIS — R197 Diarrhea, unspecified: Secondary | ICD-10-CM

## 2019-11-13 DIAGNOSIS — R112 Nausea with vomiting, unspecified: Secondary | ICD-10-CM | POA: Insufficient documentation

## 2019-11-13 LAB — COMPREHENSIVE METABOLIC PANEL
ALT: 43 U/L (ref 0–44)
AST: 21 U/L (ref 15–41)
Albumin: 3.6 g/dL (ref 3.5–5.0)
Alkaline Phosphatase: 59 U/L (ref 38–126)
Anion gap: 10 (ref 5–15)
BUN: 15 mg/dL (ref 6–20)
CO2: 27 mmol/L (ref 22–32)
Calcium: 8.6 mg/dL — ABNORMAL LOW (ref 8.9–10.3)
Chloride: 100 mmol/L (ref 98–111)
Creatinine, Ser: 0.93 mg/dL (ref 0.61–1.24)
GFR calc Af Amer: >60 mL/min (ref >60)
GFR calc non Af Amer: >60 mL/min (ref >60)
Glucose, Bld: 153 mg/dL — ABNORMAL HIGH (ref 70–99)
Potassium: 4 mmol/L (ref 3.5–5.1)
Sodium: 137 mmol/L (ref 135–145)
Total Bilirubin: 0.6 mg/dL (ref 0.3–1.2)
Total Protein: 6.4 g/dL — ABNORMAL LOW (ref 6.5–8.1)

## 2019-11-13 LAB — CBC WITH DIFFERENTIAL/PLATELET
Abs Immature Granulocytes: 0.29 10*3/uL — ABNORMAL HIGH (ref 0.00–0.07)
Basophils Absolute: 0.1 10*3/uL (ref 0.0–0.1)
Basophils Relative: 1 %
Eosinophils Absolute: 0.7 10*3/uL — ABNORMAL HIGH (ref 0.0–0.5)
Eosinophils Relative: 6 %
HCT: 40.8 % (ref 39.0–52.0)
Hemoglobin: 13.9 g/dL (ref 13.0–17.0)
Immature Granulocytes: 2 %
Lymphocytes Relative: 23 %
Lymphs Abs: 3 10*3/uL (ref 0.7–4.0)
MCH: 29 pg (ref 26.0–34.0)
MCHC: 34.1 g/dL (ref 30.0–36.0)
MCV: 85.2 fL (ref 80.0–100.0)
Monocytes Absolute: 1.1 10*3/uL — ABNORMAL HIGH (ref 0.1–1.0)
Monocytes Relative: 8 %
Neutro Abs: 8 10*3/uL — ABNORMAL HIGH (ref 1.7–7.7)
Neutrophils Relative %: 60 %
Platelets: 320 10*3/uL (ref 150–400)
RBC: 4.79 MIL/uL (ref 4.22–5.81)
RDW: 12.2 % (ref 11.5–15.5)
WBC: 13.2 10*3/uL — ABNORMAL HIGH (ref 4.0–10.5)
nRBC: 0 % (ref 0.0–0.2)

## 2019-11-13 LAB — LIPASE, BLOOD: Lipase: 36 U/L (ref 11–51)

## 2019-11-13 NOTE — Progress Notes (Signed)
Pt feeling better, tolerating PO's,  Recommended f/up KUB, pt warned to back off on diet if sx return

## 2019-11-16 ENCOUNTER — Ambulatory Visit
Admission: RE | Admit: 2019-11-16 | Discharge: 2019-11-16 | Disposition: A | Discharge status: Home / Self Care | Payer: 59 | Source: Ambulatory Visit | Attending: Family Medicine | Admitting: Family Medicine

## 2019-11-16 DIAGNOSIS — K567 Ileus, unspecified: Secondary | ICD-10-CM | POA: Insufficient documentation

## 2019-12-19 ENCOUNTER — Other Ambulatory Visit: Payer: Self-pay | Admitting: Emergency Medicine

## 2019-12-19 DIAGNOSIS — K219 Gastro-esophageal reflux disease without esophagitis: Secondary | ICD-10-CM

## 2019-12-19 MED ORDER — FAMOTIDINE 20 MG PO TABS: 20.0000 mg | ORAL_TABLET | Freq: Two times a day (BID) | ORAL | 1 refills | Status: DC | PRN

## 2020-01-06 ENCOUNTER — Ambulatory Visit: Payer: 59 | Attending: Internal Medicine

## 2020-01-06 DIAGNOSIS — Z23 Encounter for immunization: Secondary | ICD-10-CM

## 2020-01-06 NOTE — Progress Notes (Signed)
   Covid-19 Vaccination Clinic  Name:  Eldin Neidich    MRN: [id number] DOB: Aug 27, 1976  01/06/2020  Mr. Rosasco was observed post Covid-19 immunization for 15 minutes without incidence. He was provided with Vaccine Information Sheet and instruction to access the V-Safe system.   Mr. Lamkin was instructed to call 911 with any severe reactions post vaccine: Marland Kitchen Difficulty breathing  . Swelling of your face and throat  . A fast heartbeat  . A bad rash all over your body  . Dizziness and weakness    Immunizations Administered    Name Date Dose VIS Date Route   Pfizer COVID-19 Vaccine 01/06/2020 11:36 AM 0.3 mL 11/09/2019 Intramuscular   Manufacturer: [place]   Lot: [id number]   [place]: [id number]

## 2020-01-14 ENCOUNTER — Other Ambulatory Visit: Payer: Self-pay | Admitting: Family Medicine

## 2020-01-14 DIAGNOSIS — K219 Gastro-esophageal reflux disease without esophagitis: Secondary | ICD-10-CM

## 2020-01-14 NOTE — Telephone Encounter (Signed)
Medication Refill - Medication: famotidine  Has the patient contacted their pharmacy? Yes.   (Agent: If no, request that the patient contact the pharmacy for the refill.) (Agent: If yes, when and what did the pharmacy advise?)  Preferred Pharmacy (with phone number or street name):  [facility], [place]  [place] [address]  Phone: [phone] Fax: [phone]  Not a 24 hour pharmacy; exact hours not known.     Agent: Please be advised that RX refills may take up to 3 business days. We ask that you follow-up with your pharmacy.

## 2020-01-16 NOTE — Telephone Encounter (Signed)
Order sent for refill

## 2020-01-22 NOTE — Progress Notes (Signed)
Patient ID: Wayne Flowers, male    DOB: 05-09-1976, 44 y.o.   MRN: [id number]  PCP: Hubbard Hartshorn, FNP  Chief Complaint  Patient presents with  . Diarrhea    using discylocmine to help-was prescribed last visit  . Gas    "rotten egg burps"  . Bloated    abdominal cramps    Subjective:   Wayne Flowers is a 44 y.o. male, presents to clinic with CC of the following:  Chief Complaint  Patient presents with  . Diarrhea    using discylocmine to help-was prescribed last visit  . Gas    "rotten egg burps"  . Bloated    abdominal cramps    HPI:  Patient is a 44 y.o. male with DM, HTN, HLD, OSA, morbid obesity, GERD who was seen 11/12/2019 with abdominal pain, N/V/diarrhea by Delsa Grana Abd x-ray obtained  - Single dilated air-filled small bowel loop is noted in left upper  quadrant which may represent focal ileus.  A f/u abdominal film on 11/16/2019 -Bowel gas pattern is currently unremarkable and does not suggest  bowel obstruction or ileus. There is moderate stool in the colon. No  evident free air.    Labs showed blood sugar was elevated, calcium was a little Flowers, protein was a little Flowers, kidney function was good and other electrolytes were normal. His liver tests were normal. Also had a slightly elevated white count. A1C was 6.8 in December, lipid panel good, Hep C and HIV negative  His symptoms return 2 to 3 days ago, very much the same as previous with the only exception not accompanied by vomiting this time.  His sx's completely resolved after his last visit before the recurrence.  He noted some loose bowel movements, some burping that was foul-smelling and abdominal discomfort, bloating and gassy.  Symptoms worsened with more diarrhea, and he was up about every hour to an hour and a half overnight with loose bowel movements that were watery and liquid.  Denies any blood or dark black stools.  The abdominal discomfort is more above the umbilicus, very  diffuse, occasionally radiates down towards the umbilicus.  No marked lower quadrants pains.  Denied fevers.  Did not eat anything unusual before symptoms came on, and he has been taking a stool softener routinely, and also Bentyl product which he was given last time to help.  Just taking that in the last day or 2. He denies any concerns for Covid, with no loss of taste or smell, no cough, no congestion or sore throats.  He had the first dose of the vaccine about 3 weeks ago. He does not check his sugars regularly at home, is on the Trulicity for control, and feels like they have been fairly well controlled in the recent past and denied any increased thirst, marked fatigue or other more concerning symptoms prior to these coming on 2 to 3 days ago.  His past medical history is significant for GERD.  He notes the medicine controls this well, and has not had symptoms like the above with his GERD in the past.  He denies any burning pains of the sternal region, and the burping does not feel like acid in his opinion.  There is no history of abdominal surgery, colon cancer, Crohn's disease, gallstones, irritable bowel syndrome, pancreatitis, PUD or ulcerative colitis.   Pt on pepcid - states that prilosec made him have memory loss - unclear if he has tried or can try  other PPI's  Tob - never smoker Alcohol - on special occasions   Patient Active Problem List   Diagnosis Date Noted  . Morbid obesity ([place]) 09/08/2017  . Hearing loss 03/11/2017  . Hyperlipidemia LDL goal <70 07/19/2016  . Medication monitoring encounter 07/19/2016  . Type 2 diabetes mellitus ([place]) 06/23/2016  . Allergic rhinitis with postnasal drip 12/22/2015  . Obstructive sleep apnea treated with continuous positive airway pressure (CPAP) 06/16/2015  . Hypertension goal BP (blood pressure) < 140/90 06/16/2015  . GERD without esophagitis 06/16/2015  . Stiffness of right shoulder joint 06/16/2015  . ED (erectile dysfunction) of  organic origin 08/22/2013  . Flowers libido 08/22/2013  . Right upper quadrant pain 02/19/2013      Current Outpatient Medications:  .  atorvastatin (LIPITOR) 40 MG tablet, Take 1 tablet (40 mg total) by mouth at bedtime., Disp: 90 tablet, Rfl: 1 .  dicyclomine (BENTYL) 20 MG tablet, Take 1 tablet (20 mg total) by mouth 3 (three) times daily as needed for spasms (abd cramping)., Disp: 60 tablet, Rfl: 1 .  famotidine (PEPCID) 20 MG tablet, Take 1 tablet (20 mg total) by mouth 2 (two) times daily as needed for heartburn or indigestion., Disp: 180 tablet, Rfl: 1 .  fluticasone (FLONASE) 50 MCG/ACT nasal spray, USE 2 SPRAYS INTO EACH NOSTRIL ONCE DAILY, Disp: 48 g, Rfl: 3 .  lisinopril (ZESTRIL) 10 MG tablet, Take 1 tablet (10 mg total) by mouth daily., Disp: 90 tablet, Rfl: 1 .  sildenafil (REVATIO) 20 MG tablet, TAKE 3 TO 5 TABLETS DAILY AS NEEDED FOR ERECTILE DYSFUNCTION, Disp: 40 tablet, Rfl: 5 .  TRULICITY 1.5 0000000 SOPN, INJECT 0.5 ML SUBQ ONCE A WEEK, Disp: 6 mL, Rfl: 1   Allergies  Allergen Reactions  . Omeprazole Other (See Comments)    Reaction:  Memory loss      Past Surgical History:  Procedure Laterality Date  . CYSTOSCOPY W/ RETROGRADES Left 09/16/2015   Procedure: CYSTOSCOPY WITH RETROGRADE PYELOGRAM;  Surgeon: Royston Cowper, MD;  Location: [facility] ORS;  Service: Urology;  Laterality: Left;  . CYSTOSCOPY WITH URETEROSCOPY AND STENT PLACEMENT    . URETEROSCOPY WITH HOLMIUM LASER LITHOTRIPSY Left 09/16/2015   Procedure: URETEROSCOPY WITH HOLMIUM LASER LITHOTRIPSY;  Surgeon: Royston Cowper, MD;  Location: [facility] ORS;  Service: Urology;  Laterality: Left;  Marland Kitchen VASECTOMY       Family History  Problem Relation Age of Onset  . Diabetes Mother   . Cancer Father        renal  . Spina bifida Sister   . Appendicitis Son   . Cancer Maternal Grandfather        black lung  . Cancer Paternal Grandmother        lung  . Colon cancer Paternal Grandmother   . Diabetes Sister       Social History   Socioeconomic History  . Marital status: Married    Spouse name: Wayne Flowers  . Number of children: 2  . Years of education: Not on file  . Highest education level: Not on file  Occupational History  . Not on file  Tobacco Use  . Smoking status: Never Smoker  . Smokeless tobacco: Never Used  Substance and Sexual Activity  . Alcohol use: Yes    Alcohol/week: 0.0 - 1.0 standard drinks    Comment: rarley  . Drug use: No  . Sexual activity: Yes    Partners: Female  Other Topics Concern  . Not on file  Social History Narrative  . Not on file   Social Determinants of Health   Financial Resource Strain: Flowers Risk   . Difficulty of Paying Living Expenses: Not hard at all  Food Insecurity: No Food Insecurity  . Worried About Charity fundraiser in the Last Year: Never true  . Ran Out of Food in the Last Year: Never true  Transportation Needs: No Transportation Needs  . Lack of Transportation (Medical): No  . Lack of Transportation (Non-Medical): No  Physical Activity: Inactive  . Days of Exercise per Week: 0 days  . Minutes of Exercise per Session: 0 min  Stress: No Stress Concern Present  . Feeling of Stress : Only a little  Social Connections: Not Isolated  . Frequency of Communication with Friends and Family: More than three times a week  . Frequency of Social Gatherings with Friends and Family: Once a week  . Attends Religious Services: More than 4 times per year  . Active Member of Clubs or Organizations: Yes  . Attends Archivist Meetings: Never  . Marital Status: Married  Human resources officer Violence: Not At Risk  . Fear of Current or Ex-Partner: No  . Emotionally Abused: No  . Physically Abused: No  . Sexually Abused: No    With staff assistance, above reviewed with the patient today.  ROS: As per HPI, otherwise no specific complaints on a limited and focused system review   No results found for this or any previous visit (from the  past 72 hour(s)).   PHQ2/9: Depression screen [facility] 2/9 01/23/2020 11/12/2019 11/06/2019 06/04/2019 11/17/2018  Decreased Interest 0 0 0 0 0  Down, Depressed, Hopeless 0 0 0 0 0  PHQ - 2 Score 0 0 0 0 0  Altered sleeping 1 0 0 0 0  Tired, decreased energy 1 0 0 0 0  Change in appetite 0 0 0 0 0  Feeling bad or failure about yourself  0 0 0 0 0  Trouble concentrating 0 0 0 0 0  Moving slowly or fidgety/restless 0 0 0 0 0  Suicidal thoughts 0 0 0 0 0  PHQ-9 Score 2 0 0 0 0  Difficult doing work/chores Not difficult at all Not difficult at all Not difficult at all Not difficult at all Not difficult at all   PHQ-2/9 Result is neg for depression  Fall Risk: Fall Risk  01/23/2020 11/12/2019 11/06/2019 06/04/2019 11/17/2018  Falls in the past year? 0 0 0 0 0  Number falls in past yr: 0 0 0 0 0  Injury with Fall? 0 0 0 0 0  Follow up - - Falls evaluation completed - -      Objective:   Vitals:   01/23/20 0748  BP: 118/82  Pulse: (!) 110  Resp: 16  Temp: (!) 97.3 F (36.3 C)  TempSrc: Temporal  SpO2: 97%  Weight: 236 lb 6.4 oz (107.2 kg)  Height: 5\' 9"  (1.753 m)    Body mass index is 34.91 kg/m.  Physical Exam   NAD, masked, obese HEENT - [place]/AT, sclera anicteric, conj - non-inj'ed, pharynx clear Neck - supple, no adenopathy,  Car - RRR without m/g/r, not tachy on my exam, HR approx 90. Pulm- RR and effort normal at rest, CTA without wheeze or rales Abd - soft, tender diffusely throughout the upper quadrants above the umbilicus.  No rebound or guarding present.  No marked lower quadrant tenderness.  Mildly distended.  BS+,  no obvious masses  Back - no CVA tenderness Skin- no rash noted on exposed areas,  Ext - no LE edema, Neuro/psychiatric - affect was not flat, appropriate with conversation  Alert and oriented  Grossly non-focal, Speech and gait are normal   Results for orders placed or performed during the hospital encounter of 11/13/19  CBC w/ Diff  Result Value Ref  Range   WBC 13.2 (H) 4.0 - 10.5 K/uL   RBC 4.79 4.22 - 5.81 MIL/uL   Hemoglobin 13.9 13.0 - 17.0 g/dL   HCT 40.8 39.0 - 52.0 %   MCV 85.2 80.0 - 100.0 fL   MCH 29.0 26.0 - 34.0 pg   MCHC 34.1 30.0 - 36.0 g/dL   RDW 12.2 11.5 - 15.5 %   Platelets 320 150 - 400 K/uL   nRBC 0.0 0.0 - 0.2 %   Neutrophils Relative % 60 %   Neutro Abs 8.0 (H) 1.7 - 7.7 K/uL   Lymphocytes Relative 23 %   Lymphs Abs 3.0 0.7 - 4.0 K/uL   Monocytes Relative 8 %   Monocytes Absolute 1.1 (H) 0.1 - 1.0 K/uL   Eosinophils Relative 6 %   Eosinophils Absolute 0.7 (H) 0.0 - 0.5 K/uL   Basophils Relative 1 %   Basophils Absolute 0.1 0.0 - 0.1 K/uL   Immature Granulocytes 2 %   Abs Immature Granulocytes 0.29 (H) 0.00 - 0.07 K/uL  Lipase, blood  Result Value Ref Range   Lipase 36 11 - 51 U/L  Comprehensive metabolic panel  Result Value Ref Range   Sodium 137 135 - 145 mmol/L   Potassium 4.0 3.5 - 5.1 mmol/L   Chloride 100 98 - 111 mmol/L   CO2 27 22 - 32 mmol/L   Glucose, Bld 153 (H) 70 - 99 mg/dL   BUN 15 6 - 20 mg/dL   Creatinine, Ser 0.93 0.61 - 1.24 mg/dL   Calcium 8.6 (L) 8.9 - 10.3 mg/dL   Total Protein 6.4 (L) 6.5 - 8.1 g/dL   Albumin 3.6 3.5 - 5.0 g/dL   AST 21 15 - 41 U/L   ALT 43 0 - 44 U/L   Alkaline Phosphatase 59 38 - 126 U/L   Total Bilirubin 0.6 0.3 - 1.2 mg/dL   GFR calc non Af Amer >60 >60 mL/min   GFR calc Af Amer >60 >60 mL/min   Anion gap 10 5 - 15       Assessment & Plan:  1. Generalized abdominal pain Discussed the likely source being a gastroenteritis with the symptom complex above.  Noted concern with his history of the ileus fairly recent past, and do feel obtaining an x-ray to help further assess will be helpful.  With his diabetes history, noted diabetic gastroparesis as a possible concern and do feel ensuring no obstructive concerns with the x-ray will be helpful.  With no recent antibiotics or concerning travel, not feel rushing to any stool cultures are needed  presently. Emphasized the importance of staying well-hydrated and very bland with any diet attempts presently Can use a Pepto-Bismol type product as needed in the short-term as well as an Imodium product as needed for diarrhea. Can use the Bentyl as needed. Feel the stool softener will be particularly helpful here presently, and can continue as improving and await the x-ray result. We will check some labs as well, and he went to get the x-ray at the hospital, and will return back to the office when the phlebotomist is here shortly to have  the labs obtained.  We will also check a urine dip as well as a blood fingerstick for glucose. - DG Abd 1 View; Future  2. Type 2 diabetes mellitus with other specified complication, without long-term current use of insulin ([place]) He notes sugars have been fairly well controlled in the past with his Trulicity, really doubt a DKA concern presently, with a urine dip and a spot finger check for glucose ordered to help assess. Labs ordered to help assess.  3. Morbid obesity ([place])   4. GERD without esophagitis Do not feel his symptoms are completely due to GERD as we discussed.  Continue with his Pepcid for this presently.  5. Hypertension goal BP (blood pressure) < 140/90 Blood pressure was okay today.  6. Diarrhea As above noted Stay well hydrated emphasized, bland diet short term.   Urine dip -  negative (no ketones) Glucose check by finger stick - 156  Await labs and x-ray results.   Towanda Malkin, MD 01/23/20 7:55 AM

## 2020-01-23 ENCOUNTER — Ambulatory Visit: Payer: 59 | Admitting: Internal Medicine

## 2020-01-23 ENCOUNTER — Ambulatory Visit
Admission: RE | Admit: 2020-01-23 | Discharge: 2020-01-23 | Disposition: A | Discharge status: Home / Self Care | Payer: 59 | Source: Ambulatory Visit | Attending: Internal Medicine | Admitting: Internal Medicine

## 2020-01-23 ENCOUNTER — Other Ambulatory Visit: Payer: Self-pay

## 2020-01-23 ENCOUNTER — Encounter: Payer: Self-pay | Admitting: Internal Medicine

## 2020-01-23 VITALS — BP 118/82 | HR 110 | Temp 97.3°F | Resp 16 | Ht 69.0 in | Wt 236.4 lb

## 2020-01-23 DIAGNOSIS — K219 Gastro-esophageal reflux disease without esophagitis: Secondary | ICD-10-CM

## 2020-01-23 DIAGNOSIS — R1084 Generalized abdominal pain: Secondary | ICD-10-CM | POA: Insufficient documentation

## 2020-01-23 DIAGNOSIS — E1169 Type 2 diabetes mellitus with other specified complication: Secondary | ICD-10-CM | POA: Diagnosis not present

## 2020-01-23 DIAGNOSIS — R197 Diarrhea, unspecified: Secondary | ICD-10-CM

## 2020-01-23 DIAGNOSIS — I10 Essential (primary) hypertension: Secondary | ICD-10-CM

## 2020-01-23 LAB — POCT URINALYSIS DIPSTICK
Bilirubin, UA: NEGATIVE
Blood, UA: NEGATIVE
Glucose, UA: NEGATIVE
Ketones, UA: NEGATIVE
Leukocytes, UA: NEGATIVE
Nitrite, UA: NEGATIVE
Odor: NORMAL
Protein, UA: NEGATIVE
Spec Grav, UA: 1.025 (ref 1.010–1.025)
Urobilinogen, UA: 0.2 E.U./dL
pH, UA: 6 (ref 5.0–8.0)

## 2020-01-23 LAB — GLUCOSE, POCT (MANUAL RESULT ENTRY): POC Glucose: 156 mg/dl — AB (ref 70–99)

## 2020-01-23 NOTE — Progress Notes (Signed)
I called patient and shared the x-ray result, worrisome for possible partial or early small bowel obstruction. Awaiting the labs presently. He noted has not eaten anything today and does not feel much different from this morning.No worsening sx's. Will continue with plans from visit and if labs return ok tomorrow, will ask Melissa to contact patient Friday morning for follow-up. If he is significantly worsening, he should contact us sooner.  He is not on MyChart and he tried to get on and couldn't and tried to call their help desk and could not get thru earlier today.

## 2020-01-23 NOTE — Patient Instructions (Signed)
Please obtain x-ray today Stay well hydrated. Can try a pepto-bismal product as needed and also an imodium product as needed for diarrhea.

## 2020-01-24 ENCOUNTER — Other Ambulatory Visit: Payer: Self-pay | Admitting: Family Medicine

## 2020-01-24 DIAGNOSIS — K219 Gastro-esophageal reflux disease without esophagitis: Secondary | ICD-10-CM

## 2020-01-24 LAB — COMPLETE METABOLIC PANEL WITH GFR
AG Ratio: 1.9 (calc) (ref 1.0–2.5)
ALT: 37 U/L (ref 9–46)
AST: 16 U/L (ref 10–40)
Albumin: 4.6 g/dL (ref 3.6–5.1)
Alkaline phosphatase (APISO): 83 U/L (ref 36–130)
BUN: 17 mg/dL (ref 7–25)
CO2: 27 mmol/L (ref 20–32)
Calcium: 9.5 mg/dL (ref 8.6–10.3)
Chloride: 102 mmol/L (ref 98–110)
Creat: 1.05 mg/dL (ref 0.60–1.35)
GFR, Est African American: 99 mL/min/{1.73_m2} (ref >=60)
GFR, Est Non African American: 85 mL/min/{1.73_m2} (ref >=60)
Globulin: 2.4 g/dL (calc) (ref 1.9–3.7)
Glucose, Bld: 150 mg/dL — ABNORMAL HIGH (ref 65–99)
Potassium: 4.8 mmol/L (ref 3.5–5.3)
Sodium: 137 mmol/L (ref 135–146)
Total Bilirubin: 0.5 mg/dL (ref 0.2–1.2)
Total Protein: 7 g/dL (ref 6.1–8.1)

## 2020-01-24 LAB — CBC WITH DIFFERENTIAL/PLATELET
Absolute Monocytes: 1051 cells/uL — ABNORMAL HIGH (ref 200–950)
Basophils Absolute: 27 cells/uL (ref 0–200)
Basophils Relative: 0.2 %
Eosinophils Absolute: 333 cells/uL (ref 15–500)
Eosinophils Relative: 2.5 %
HCT: 46.2 % (ref 38.5–50.0)
Hemoglobin: 15.6 g/dL (ref 13.2–17.1)
Lymphs Abs: 2075 cells/uL (ref 850–3900)
MCH: 29.1 pg (ref 27.0–33.0)
MCHC: 33.8 g/dL (ref 32.0–36.0)
MCV: 86.2 fL (ref 80.0–100.0)
MPV: 9.8 fL (ref 7.5–12.5)
Monocytes Relative: 7.9 %
Neutro Abs: 9815 cells/uL — ABNORMAL HIGH (ref 1500–7800)
Neutrophils Relative %: 73.8 %
Platelets: 348 10*3/uL (ref 140–400)
RBC: 5.36 10*6/uL (ref 4.20–5.80)
RDW: 12.6 % (ref 11.0–15.0)
Total Lymphocyte: 15.6 %
WBC: 13.3 10*3/uL — ABNORMAL HIGH (ref 3.8–10.8)

## 2020-01-24 LAB — LIPASE: Lipase: 15 U/L (ref 7–60)

## 2020-01-24 NOTE — Telephone Encounter (Signed)
Requested Prescriptions  Pending Prescriptions Disp Refills  . famotidine (PEPCID) 20 MG tablet [Pharmacy Med Name: FAMOTIDINE 20 MG TABLET 20 Tablet] 60 tablet 1    Sig: TAKE 1 TABLET BY MOUTH 2 TIMES DAILY AS NEEDED FOR HEARTBURN OR INDIGESTION.     Gastroenterology:  H2 Antagonists Passed - 01/24/2020  8:53 AM      Passed - Valid encounter within last 12 months    Recent Outpatient Visits          Yesterday Generalized abdominal pain   [facility] Lebron Conners D, MD   2 months ago Nausea vomiting and diarrhea   [facility] Delsa Grana, PA-C   2 months ago Annual physical exam   [facility] [place], Astrid Divine, FNP   7 months ago Hypertension goal BP (blood pressure) < 140/90   [place], NP   1 year ago Type 2 diabetes mellitus without complication, without long-term current use of insulin [facility])   [place], Satira Anis, MD      Future Appointments            In 1 week Delsa Grana, PA-C [facility], [facility]

## 2020-01-25 ENCOUNTER — Other Ambulatory Visit: Payer: Self-pay | Admitting: Internal Medicine

## 2020-01-25 DIAGNOSIS — Z8719 Personal history of other diseases of the digestive system: Secondary | ICD-10-CM | POA: Insufficient documentation

## 2020-01-25 DIAGNOSIS — R1084 Generalized abdominal pain: Secondary | ICD-10-CM

## 2020-01-25 DIAGNOSIS — K56600 Partial intestinal obstruction, unspecified as to cause: Secondary | ICD-10-CM

## 2020-01-25 NOTE — Progress Notes (Signed)
Patient noted was feeling better on phone call f/u today and labs were reviewed with him.  He requested a referral to [facility] and one was ordered.

## 2020-01-28 ENCOUNTER — Ambulatory Visit: Payer: 59 | Admitting: Family Medicine

## 2020-01-28 ENCOUNTER — Other Ambulatory Visit: Payer: Self-pay | Admitting: Emergency Medicine

## 2020-01-28 DIAGNOSIS — E119 Type 2 diabetes mellitus without complications: Secondary | ICD-10-CM

## 2020-01-28 MED ORDER — TRULICITY 1.5 MG/0.5ML ~~LOC~~ SOAJ: SUBCUTANEOUS | 1 refills | Status: DC

## 2020-01-30 ENCOUNTER — Ambulatory Visit: Payer: 59 | Attending: Internal Medicine

## 2020-01-30 DIAGNOSIS — Z23 Encounter for immunization: Secondary | ICD-10-CM | POA: Insufficient documentation

## 2020-01-30 NOTE — Progress Notes (Signed)
   Covid-19 Vaccination Clinic  Name:  Wayne Flowers    MRN: [id number] DOB: June 12, 1976  01/30/2020  [name] was observed post Covid-19 immunization for 15 minutes without incident. He was provided with Vaccine Information Sheet and instruction to access the V-Safe system.   [name] was instructed to call 911 with any severe reactions post vaccine: Marland Kitchen Difficulty breathing  . Swelling of face and throat  . A fast heartbeat  . A bad rash all over body  . Dizziness and weakness   Immunizations Administered    Name Date Dose VIS Date Route   Pfizer COVID-19 Vaccine 01/30/2020  8:27 AM 0.3 mL 11/09/2019 Intramuscular   Manufacturer: [place]   Lot: [id number]   [place]: [id number]

## 2020-02-04 ENCOUNTER — Other Ambulatory Visit: Payer: Self-pay

## 2020-02-04 ENCOUNTER — Encounter: Payer: Self-pay | Admitting: Family Medicine

## 2020-02-04 ENCOUNTER — Ambulatory Visit: Payer: 59 | Admitting: Family Medicine

## 2020-02-04 ENCOUNTER — Other Ambulatory Visit: Payer: Self-pay | Admitting: Family Medicine

## 2020-02-04 VITALS — BP 114/74 | HR 100 | Temp 98.3°F | Resp 14 | Ht 69.0 in | Wt 239.6 lb

## 2020-02-04 DIAGNOSIS — K56600 Partial intestinal obstruction, unspecified as to cause: Secondary | ICD-10-CM

## 2020-02-04 DIAGNOSIS — I10 Essential (primary) hypertension: Secondary | ICD-10-CM

## 2020-02-04 DIAGNOSIS — E785 Hyperlipidemia, unspecified: Secondary | ICD-10-CM

## 2020-02-04 DIAGNOSIS — E1169 Type 2 diabetes mellitus with other specified complication: Secondary | ICD-10-CM | POA: Diagnosis not present

## 2020-02-04 DIAGNOSIS — Z5181 Encounter for therapeutic drug level monitoring: Secondary | ICD-10-CM

## 2020-02-04 DIAGNOSIS — M25522 Pain in left elbow: Secondary | ICD-10-CM | POA: Diagnosis not present

## 2020-02-04 DIAGNOSIS — M79661 Pain in right lower leg: Secondary | ICD-10-CM

## 2020-02-04 DIAGNOSIS — M79662 Pain in left lower leg: Secondary | ICD-10-CM

## 2020-02-04 MED ORDER — ATORVASTATIN CALCIUM 40 MG PO TABS: 40.0000 mg | ORAL_TABLET | Freq: Every day | ORAL | 3 refills | Status: DC

## 2020-02-04 MED ORDER — LISINOPRIL 10 MG PO TABS: 10.0000 mg | ORAL_TABLET | Freq: Every day | ORAL | 3 refills | Status: DC

## 2020-02-04 MED ORDER — TRULICITY 0.75 MG/0.5ML ~~LOC~~ SOAJ: 0.7500 mg | SUBCUTANEOUS | 0 refills | Status: DC

## 2020-02-04 NOTE — Progress Notes (Signed)
Name: Wayne Flowers   MRN: [id number]    DOB: 10-27-1976   Date:02/04/2020       Progress Note  Chief Complaint  Patient presents with  . Follow-up  . Diabetes  . Hypertension  . Hyperlipidemia  . Cyst    on left arm at elbow, injury about 2 months ago    Subjective:   Wayne Flowers is a 44 y.o. male, presents to clinic for routine follow up on the conditions listed above, he also had another episode of severe abd pain and bloating, seen by colleague here, again had findings concerning for partial or early SBO - he has continued to take bentyl when it occurs and it provides some relief.  He has GI f/up in a few weeks to further eval. Pt had labs done roughly 2-3 weeks ago, showed continued mild leukocytosis, WBCs 13.3, elevated neutrophils, lipase, LFTs and electrolytes were all normal, potassium was in normal range - 4.8, sugar was elevated to 150   Here for routine f/up as well: Hypertension:  Currently managed on lisinopril 10 mg daily Pt reports good med compliance and denies any SE.   Blood pressure today is well controlled. BP Readings from Last 3 Encounters:  02/04/20 114/74  01/23/20 118/82  11/06/19 128/80  Pt denies CP, SOB, exertional sx, LE edema, palpitation, Ha's, visual disturbances, No lightheadedness, hypotension, syncope. He continues to try and work on healthy diet and low salt  Hyperlipidemia: Current Medication Regimen:  lipitor 40 mg daily, excellent compliance  Last Lipids: Lab Results  Component Value Date   CHOL 118 11/06/2019   HDL 43 11/06/2019   LDLCALC 61 11/06/2019   TRIG 64 11/06/2019   CHOLHDL 2.7 11/06/2019  - Denies: Chest pain, shortness of breath, myalgias - he is getting some mild calf cramping here and there when going on walks for several miles, not consistent and not every time he does a long walk, no change to skin/hair of LE, no LE edema  - Documented aortic atherosclerosis? No - Risk factors for  atherosclerosis: diabetes mellitus, hypercholesterolemia and hypertension  Diabetes Mellitus Type II: Currently managing with trulicity 1.5 mg dose weekly Pt notes good med compliance Pt has some SE from meds - sometimes feels N/bloated and full early -it is not every day but some days a few minutes after he gets his injection he will have a lot of GI symptoms and it seems to ease up.  He he has been on medication for over a year was on it with his last PCP, Dr. Sanda Klein, his symptoms with continued 1.5 mg dose are similar to when he first started the medication and did become worse with the increase the medication he cannot remember how long ago it was changed from 0.75 to 1.5 mg weekly No hypoglycemic episodes Denies: Polyuria, polydipsia, polyphagia, vision changes, or neuropathy Recent pertinent labs: Lab Results  Component Value Date   HGBA1C 6.8 (H) 11/06/2019   HGBA1C 6.6 (H) 06/04/2019   HGBA1C 6.6 (H) 11/17/2018    Pt is due for DM foot exam  -doing today and patient is due for eye exam -though he states he gets it once a year we will need to request records and review ACEI/ARB: Yes Statin: Yes  Some intermittent calf pain only when going on long walks for exercise like walking 3 miles -denies any other muscle pains anywhere in his body denies any low back pain or pain radiating to her buttocks or thighs, denies  any muscle spasms or paresthesia.  We discussed getting screening ABIs he is not sure if he wants to do.  Sx intermittent and not worsening - do not seem to come with walking longer distances -not worsening with longer length or exertion he does not have to stop and rest and he denies any weakness, just when they happen to come it is when he is on a longer walks sometimes but it is new over the past several months  Left elbow injury a few months ago-  Jan 12th -something came loose at work and hit the inside of his arm caused bruising from the inner left arm elbow and forearm he  did not get it checked out but he has some residual pain is almost 2 months later, his wife was concerned.  He states that medial left elbow he can feel a small loose bump or lump and is painful to palpation or if he bumps it on anything he has no pain without any palpation he denies any numbness weakness or change in range of motion to his left elbow or arm.  He was concerned that maybe he damaged blood vessel or broke something off the bone and he wanted to ask about it today.  GERD: Patient notes that his acid reflux is controlled with Pepcid twice daily, denies any dysphagia, melena, hematochezia   Patient Active Problem List   Diagnosis Date Noted  . Partial small bowel obstruction ([place]) 01/25/2020  . Morbid obesity ([place]) 09/08/2017  . Hearing loss 03/11/2017  . Hyperlipidemia LDL goal <70 07/19/2016  . Medication monitoring encounter 07/19/2016  . Type 2 diabetes mellitus ([place]) 06/23/2016  . Allergic rhinitis with postnasal drip 12/22/2015  . Obstructive sleep apnea treated with continuous positive airway pressure (CPAP) 06/16/2015  . Hypertension goal BP (blood pressure) < 140/90 06/16/2015  . GERD without esophagitis 06/16/2015  . Stiffness of right shoulder joint 06/16/2015  . 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 Low libido 08/22/2013  . Right upper quadrant pain 02/19/2013    Past Surgical History:  Procedure Laterality Date  . CYSTOSCOPY W/ RETROGRADES Left 09/16/2015   Procedure: CYSTOSCOPY WITH RETROGRADE PYELOGRAM;  Surgeon: Royston Cowper, MD;  Location: [facility];  Service: Urology;  Laterality: Left;  . CYSTOSCOPY WITH URETEROSCOPY AND STENT PLACEMENT    . URETEROSCOPY WITH HOLMIUM LASER LITHOTRIPSY Left 09/16/2015   Procedure: URETEROSCOPY WITH HOLMIUM LASER LITHOTRIPSY;  Surgeon: Royston Cowper, MD;  Location: [facility];  Service: Urology;  Laterality: Left;  Marland Kitchen VASECTOMY      Family History  Problem Relation Age of Onset  . Diabetes Mother     . Cancer Father        renal  . Spina bifida Sister   . Appendicitis Son   . Cancer Maternal Grandfather        black lung  . Cancer Paternal Grandmother        lung  . Colon cancer Paternal Grandmother   . Diabetes Sister     Social History   Tobacco Use  . Smoking status: Never Smoker  . Smokeless tobacco: Never Used  Substance Use Topics  . Alcohol use: Yes    Alcohol/week: 0.0 - 1.0 standard drinks    Comment: rarley  . Drug use: No      Current Outpatient Medications:  .  atorvastatin (LIPITOR) 40 MG tablet, Take 1 tablet (40 mg total) by mouth at bedtime., Disp: 90 tablet, Rfl: 1 .  dicyclomine (  BENTYL) 20 MG tablet, Take 1 tablet (20 mg total) by mouth 3 (three) times daily as needed for spasms (abd cramping)., Disp: 60 tablet, Rfl: 1 .  Dulaglutide (TRULICITY) 1.5 0000000 SOPN, Inject 0.5 cc SQ once weekly, Disp: 6 mL, Rfl: 1 .  famotidine (PEPCID) 20 MG tablet, TAKE 1 TABLET BY MOUTH 2 TIMES DAILY AS NEEDED FOR HEARTBURN OR INDIGESTION., Disp: 60 tablet, Rfl: 1 .  fluticasone (FLONASE) 50 MCG/ACT nasal spray, USE 2 SPRAYS INTO EACH NOSTRIL ONCE DAILY, Disp: 48 g, Rfl: 3 .  lisinopril (ZESTRIL) 10 MG tablet, Take 1 tablet (10 mg total) by mouth daily., Disp: 90 tablet, Rfl: 1 .  sildenafil (REVATIO) 20 MG tablet, TAKE 3 TO 5 TABLETS DAILY AS NEEDED FOR ERECTILE DYSFUNCTION, Disp: 40 tablet, Rfl: 5  Allergies  Allergen Reactions  . Omeprazole Other (See Comments)    Reaction:  Memory loss     Chart Review Today: I personally reviewed active problem list, medication list, allergies, family history, social history, health maintenance, notes from last encounter, lab results, imaging with the patient/caregiver today.  Review of Systems  10 Systems reviewed and are negative for acute change except as noted in the HPI.    Objective:    Vitals:   02/04/20 1015  BP: 114/74  Pulse: 100  Resp: 14  Temp: 98.3 F (36.8 C)  SpO2: 96%  Weight: 239 lb 9.6 oz  (108.7 kg)  Height: 5\' 9"  (1.753 m)    Body mass index is 35.38 kg/m.  Physical Exam Vitals and nursing note reviewed.  Constitutional:      General: He is not in acute distress.    Appearance: Normal appearance. He is well-developed. He is not ill-appearing, toxic-appearing or diaphoretic.     Interventions: Face mask in place.  HENT:     Head: Normocephalic and atraumatic.     Jaw: No trismus.     Right Ear: External ear normal.     Left Ear: External ear normal.  Eyes:     General: Lids are normal. No scleral icterus.    Conjunctiva/sclera: Conjunctivae normal.     Pupils: Pupils are equal, round, and reactive to light.  Neck:     Trachea: Trachea and phonation normal. No tracheal deviation.  Cardiovascular:     Rate and Rhythm: Normal rate and regular rhythm.     Pulses: Normal pulses.          Radial pulses are 2+ on the right side and 2+ on the left side.       Posterior tibial pulses are 2+ on the right side and 2+ on the left side.     Heart sounds: Normal heart sounds. No murmur. No friction rub. No gallop.   Pulmonary:     Effort: Pulmonary effort is normal. No respiratory distress.     Breath sounds: Normal breath sounds. No stridor. No wheezing, rhonchi or rales.  Abdominal:     General: Abdomen is protuberant. Bowel sounds are decreased. There is no distension.     Palpations: Abdomen is soft.     Tenderness: There is no abdominal tenderness. There is no right CVA tenderness, left CVA tenderness, guarding or rebound.  Musculoskeletal:        General: Normal range of motion.     Left elbow: No swelling, deformity, effusion or lacerations. Normal range of motion. Tenderness present in medial epicondyle. No radial head, lateral epicondyle or olecranon process tenderness.     Cervical back: Normal  range of motion and neck supple.     Right lower leg: No edema.     Left lower leg: No edema.     Comments: Left elbow medial epicondyl - no nodule, mass, edema,  effusion, or bony abnormality discretely palpated - pt with ttp to palpation of medial epicondyl and palpation just distally, no abnormality visualized or palpated to pts left forearm  Skin:    General: Skin is warm and dry.     Capillary Refill: Capillary refill takes less than 2 seconds.     Coloration: Skin is not jaundiced.     Findings: No rash.     Nails: There is no clubbing.  Neurological:     Mental Status: He is alert.     Cranial Nerves: No dysarthria or facial asymmetry.     Motor: No tremor or abnormal muscle tone.     Gait: Gait normal.  Psychiatric:        Mood and Affect: Mood normal.        Speech: Speech normal.        Behavior: Behavior normal. Behavior is cooperative.      Diabetic Foot Exam: Diabetic Foot Exam - Simple   Simple Foot Form Diabetic Foot exam was performed with the following findings: Yes 02/04/2020 10:30 AM  Visual Inspection Sensation Testing Pulse Check Comments     PHQ2/9: Depression screen [facility] 2/9 02/04/2020 01/23/2020 11/12/2019 11/06/2019 06/04/2019  Decreased Interest 0 0 0 0 0  Down, Depressed, Hopeless 0 0 0 0 0  PHQ - 2 Score 0 0 0 0 0  Altered sleeping 0 1 0 0 0  Tired, decreased energy 0 1 0 0 0  Change in appetite 0 0 0 0 0  Feeling bad or failure about yourself  0 0 0 0 0  Trouble concentrating 0 0 0 0 0  Moving slowly or fidgety/restless 0 0 0 0 0  Suicidal thoughts 0 0 0 0 0  PHQ-9 Score 0 2 0 0 0  Difficult doing work/chores Not difficult at all Not difficult at all Not difficult at all Not difficult at all Not difficult at all    phq 9 is neg, reviewed today  Fall Risk: Fall Risk  02/04/2020 01/23/2020 11/12/2019 11/06/2019 06/04/2019  Falls in the past year? 0 0 0 0 0  Number falls in past yr: 0 0 0 0 0  Injury with Fall? 0 0 0 0 0  Follow up - - - Falls evaluation completed -    Functional Status Survey: Is the patient deaf or have difficulty hearing?: Yes Does the patient have difficulty seeing, even when wearing  glasses/contacts?: No Does the patient have difficulty concentrating, remembering, or making decisions?: No Does the patient have difficulty walking or climbing stairs?: No Does the patient have difficulty dressing or bathing?: No Does the patient have difficulty doing errands alone such as visiting a doctor's office or shopping?: No   Assessment & Plan:     ICD-10-CM   1. Hypertension goal BP (blood pressure) < 140/90  I10 lisinopril (ZESTRIL) 10 MG tablet   stable, well controlled on lisinopril   2. Hyperlipidemia LDL goal <70  E78.5 atorvastatin (LIPITOR) 40 MG tablet    COMPLETE METABOLIC PANEL WITH GFR    Lipid panel    US ARTERIAL ABI (SCREENING LOWER EXTREMITY)   well controlled on lipitor 40 mg, recheck labs then stretch out f/up visits    3. Type 2 diabetes mellitus with other  specified complication, without long-term current use of insulin (HCC)  E11.69 Dulaglutide (TRULICITY) A999333 0000000 SOPN   last A1C at goal, 6.8, but mildly increasing, recheck - reduce trulicity dose to .75 mg injection due to some GI sx and worsening with new recurrent SBO? Unclear if any correllation of GLP-1inhibitor causing SBO?  Discussed delayed gastric emptying and SE- still having se so will decrease dose for the next month - will research meds/SBO, pt following with GI - can inquire further there, no past abd surgery - only lithotripsy/cystoscopy procedures  Continue bentyl PRN    4. Hyperlipidemia associated with type 2 diabetes mellitus (HCC)  E11.69 US ARTERIAL ABI (SCREENING LOWER EXTREMITY)   E78.5    see #2   5. Left elbow pain  M25.522   Xray ordered left elbow complete - 3 view done   some medial epicondyl ttp and to just distal tenderness, no effusion, no abnormality palpated, good ROM, offered plain films to check on avulsion?  If bone/tendon injury would be near improved 2-3 months after injury Offered Ortho f/up/referral but pt declineds - may want referral later   6. Bilateral  calf pain  M79.661 US ARTERIAL ABI (SCREENING LOWER EXTREMITY)   M79.662    intermittent occurance with 3 mile long walks, sometimes no pain with 3 mile long walks, no other sx/signs concerning for PAD - screening ABIs Pt was not sure about doing ABI's, but I ordered in case he is willing, I explained procedure, it is not invasive - it would r/o PAD as cause of calf pain   7. Partial small bowel obstruction (HCC)  K56.600    no abd pain currently - discussed small meals, will try to reduce trulicity dose back to A999333 mg, he has f/up with GI, recheck CBC/leukocytosis   8. Encounter for medication monitoring  Z51.81 CBC with Differential/Platelet    COMPLETE METABOLIC PANEL WITH GFR    Lipid panel    Hemoglobin A1c    Return in about 6 months (around 08/06/2020) for routine f/up.   Delsa Grana, PA-C 02/04/20 10:36 AM

## 2020-02-05 LAB — COMPLETE METABOLIC PANEL WITH GFR
AG Ratio: 2 (calc) (ref 1.0–2.5)
ALT: 46 U/L (ref 9–46)
AST: 26 U/L (ref 10–40)
Albumin: 4.6 g/dL (ref 3.6–5.1)
Alkaline phosphatase (APISO): 74 U/L (ref 36–130)
BUN: 17 mg/dL (ref 7–25)
CO2: 27 mmol/L (ref 20–32)
Calcium: 9.3 mg/dL (ref 8.6–10.3)
Chloride: 104 mmol/L (ref 98–110)
Creat: 0.96 mg/dL (ref 0.60–1.35)
GFR, Est African American: 110 mL/min/{1.73_m2} (ref >=60)
GFR, Est Non African American: 95 mL/min/{1.73_m2} (ref >=60)
Globulin: 2.3 g/dL (calc) (ref 1.9–3.7)
Glucose, Bld: 129 mg/dL (ref 65–139)
Potassium: 4.5 mmol/L (ref 3.5–5.3)
Sodium: 141 mmol/L (ref 135–146)
Total Bilirubin: 0.4 mg/dL (ref 0.2–1.2)
Total Protein: 6.9 g/dL (ref 6.1–8.1)

## 2020-02-05 LAB — CBC WITH DIFFERENTIAL/PLATELET
Absolute Monocytes: 673 {cells}/uL (ref 200–950)
Basophils Absolute: 188 {cells}/uL (ref 0–200)
Basophils Relative: 1.9 %
Eosinophils Absolute: 465 {cells}/uL (ref 15–500)
Eosinophils Relative: 4.7 %
HCT: 44.3 % (ref 38.5–50.0)
Hemoglobin: 14.8 g/dL (ref 13.2–17.1)
Lymphs Abs: 2208 {cells}/uL (ref 850–3900)
MCH: 28.9 pg (ref 27.0–33.0)
MCHC: 33.4 g/dL (ref 32.0–36.0)
MCV: 86.5 fL (ref 80.0–100.0)
MPV: 9.8 fL (ref 7.5–12.5)
Monocytes Relative: 6.8 %
Neutro Abs: 6366 {cells}/uL (ref 1500–7800)
Neutrophils Relative %: 64.3 %
Platelets: 284 10*3/uL (ref 140–400)
RBC: 5.12 Million/uL (ref 4.20–5.80)
RDW: 13 % (ref 11.0–15.0)
Total Lymphocyte: 22.3 %
WBC: 9.9 10*3/uL (ref 3.8–10.8)

## 2020-02-05 LAB — LIPID PANEL
Cholesterol: 130 mg/dL
HDL: 44 mg/dL
LDL Cholesterol (Calc): 70 mg/dL
Non-HDL Cholesterol (Calc): 86 mg/dL
Total CHOL/HDL Ratio: 3 (calc)
Triglycerides: 82 mg/dL

## 2020-02-05 LAB — HEMOGLOBIN A1C
Hgb A1c MFr Bld: 7.8 %{Hb} — ABNORMAL HIGH
Mean Plasma Glucose: 177 (calc)
eAG (mmol/L): 9.8 (calc)

## 2020-03-03 ENCOUNTER — Other Ambulatory Visit: Payer: Self-pay | Admitting: Family Medicine

## 2020-03-03 DIAGNOSIS — E1169 Type 2 diabetes mellitus with other specified complication: Secondary | ICD-10-CM

## 2020-03-05 ENCOUNTER — Ambulatory Visit: Payer: 59 | Admitting: Gastroenterology

## 2020-03-06 ENCOUNTER — Other Ambulatory Visit: Payer: Self-pay

## 2020-03-06 ENCOUNTER — Encounter (INDEPENDENT_AMBULATORY_CARE_PROVIDER_SITE_OTHER): Payer: Self-pay

## 2020-03-06 ENCOUNTER — Ambulatory Visit: Payer: 59 | Admitting: Gastroenterology

## 2020-03-06 ENCOUNTER — Encounter: Payer: Self-pay | Admitting: Gastroenterology

## 2020-03-06 VITALS — BP 127/79 | HR 106 | Temp 98.5°F | Ht 69.0 in | Wt 242.2 lb

## 2020-03-06 DIAGNOSIS — K56600 Partial intestinal obstruction, unspecified as to cause: Secondary | ICD-10-CM

## 2020-03-06 NOTE — Progress Notes (Signed)
Gastroenterology Consultation  Referring Provider:     Delsa Grana, PA-C Primary Care Physician:  Delsa Grana, PA-C Primary Gastroenterologist:  Dr. Allen Norris     Reason for Consultation:     Partial small bowel obstruction        HPI:   Wayne Flowers is a 44 y.o. y/o male referred for consultation & management of partial small bowel obstruction by Dr. Delsa Grana, PA-C.  This patient comes in today with a history of a partial small bowel obstruction being of concern on imaging.  On February 24 of this year the patient had a KUB with bloating for 2 to 3 days that showed:  IMPRESSION: Findings worrisome for partial or early small bowel obstruction.  The patient states that his symptoms started after he had a viral infection which she also reports a family member had.  That consisted of nausea vomiting with diarrhea and he remained sick while his family member quickly improved.  The patient had continuing bloating with vomiting and that when he was sent forthe KUB from his PCP. The patient is not having any symptoms at the present time and states he feels well.  There is no report of any bloating change in bowel habits constipation or diarrhea.  Past Medical History:  Diagnosis Date  . Allergy   . Atelectasis   . Chronic kidney disease    KIDNEY STONE  . ED (erectile dysfunction)   . ED (erectile dysfunction)   . GERD (gastroesophageal reflux disease)   . Hearing loss    right  . History of renal calculi 02/19/2013  . Hypertension   . Kidney stone 02/19/2013  . Metabolic syndrome   . Morbid obesity ([place]) 09/08/2017   BMI >35 plus diabetes  . Obesity, Class I, BMI 30.0-34.9 (see actual BMI)   . Pleurisy   . PONV (postoperative nausea and vomiting)   . Sleep apnea    HAS NOT USED CPAP IN 6 MONTHS  . Type 2 diabetes mellitus ([place]) 06/23/2016    Past Surgical History:  Procedure Laterality Date  . CYSTOSCOPY W/ RETROGRADES Left 09/16/2015   Procedure: CYSTOSCOPY WITH  RETROGRADE PYELOGRAM;  Surgeon: Royston Cowper, MD;  Location: [facility];  Service: Urology;  Laterality: Left;  . CYSTOSCOPY WITH URETEROSCOPY AND STENT PLACEMENT    . URETEROSCOPY WITH HOLMIUM LASER LITHOTRIPSY Left 09/16/2015   Procedure: URETEROSCOPY WITH HOLMIUM LASER LITHOTRIPSY;  Surgeon: Royston Cowper, MD;  Location: [facility];  Service: Urology;  Laterality: Left;  Marland Kitchen VASECTOMY      Prior to Admission medications   Medication Sig Start Date End Date Taking? Authorizing Provider  atorvastatin (LIPITOR) 40 MG tablet Take 1 tablet (40 mg total) by mouth at bedtime. 02/04/20   Delsa Grana, PA-C  dicyclomine (BENTYL) 20 MG tablet Take 1 tablet (20 mg total) by mouth 3 (three) times daily as needed for spasms (abd cramping). 11/12/19   Delsa Grana, PA-C  famotidine (PEPCID) 20 MG tablet TAKE 1 TABLET BY MOUTH 2 TIMES DAILY AS NEEDED FOR HEARTBURN OR INDIGESTION. 01/24/20   Hubbard Hartshorn, FNP  fluticasone (FLONASE) 50 MCG/ACT nasal spray USE 2 SPRAYS INTO EACH NOSTRIL ONCE DAILY 06/04/19   Poulose, Bethel Born, NP  lisinopril (ZESTRIL) 10 MG tablet Take 1 tablet (10 mg total) by mouth daily. 02/04/20   Delsa Grana, PA-C  sildenafil (REVATIO) 20 MG tablet TAKE 3 TO 5 TABLETS DAILY AS NEEDED FOR ERECTILE DYSFUNCTION 06/04/19   Poulose, Bethel Born, NP  TRULICITY A999333 0000000 SOPN INJECT 0.75 MG INTO THE SKIN ONCE A WEEK. 03/03/20   Delsa Grana, PA-C    Family History  Problem Relation Age of Onset  . Diabetes Mother   . Cancer Father        renal  . Spina bifida Sister   . Appendicitis Son   . Cancer Maternal Grandfather        black lung  . Cancer Paternal Grandmother        lung  . Colon cancer Paternal Grandmother   . Diabetes Sister      Social History   Tobacco Use  . Smoking status: Never Smoker  . Smokeless tobacco: Never Used  Substance Use Topics  . Alcohol use: Yes    Alcohol/week: 0.0 - 1.0 standard drinks    Comment: rarley  . Drug use: No    Allergies as of  03/06/2020 - Review Complete 02/04/2020  Allergen Reaction Noted  . Omeprazole Other (See Comments) 06/16/2015    Review of Systems:    All systems reviewed and negative except where noted in HPI.   Physical Exam:  There were no vitals taken for this visit. No LMP for male patient. General:   Alert,  Well-developed, well-nourished, pleasant and cooperative in NAD Head:  Normocephalic and atraumatic. Eyes:  Sclera clear, no icterus.   Conjunctiva pink. Ears:  Normal auditory acuity. Neck:  Supple; no masses or thyromegaly. Lungs:  Respirations even and unlabored.  Clear throughout to auscultation.   No wheezes, crackles, or rhonchi. No acute distress. Heart:  Regular rate and rhythm; no murmurs, clicks, rubs, or gallops. Abdomen:  Normal bowel sounds.  No bruits.  Soft, non-tender and non-distended without masses, hepatosplenomegaly or hernias noted.  No guarding or rebound tenderness.  Negative Carnett sign.   Rectal:  Deferred.  Pulses:  Normal pulses noted. Extremities:  No clubbing or edema.  No cyanosis. Neurologic:  Alert and oriented x3;  grossly normal neurologically. Skin:  Intact without significant lesions or rashes.  No jaundice. Lymph Nodes:  No significant cervical adenopathy. Psych:  Alert and cooperative. Normal mood and affect.  Imaging Studies: No results found.  Assessment and Plan:   Wayne Flowers is a 44 y.o. y/o male who comes in after being found to have partial small bowel obstruction.  The patient is completely asymptomatic at the present time.  The patient will be set up for a small bowel follow-through to look for any narrowing or kinking of the small bowel.  The patient has been told that if there is any abnormality found he may need to be sent for surgical evaluation.  He has also been told that if his symptoms return he should go to the emergency room because he may again developed signs and symptoms of partial or complete small bowel  obstruction. The patient will also be set up for a screening colonoscopy. The patient has been explained the plan and agrees with it   Lucilla Lame, MD. Marval Regal    Note: This dictation was prepared with Dragon dictation along with smaller phrase technology. Any transcriptional errors that result from this process are unintentional.

## 2020-03-18 ENCOUNTER — Other Ambulatory Visit: Payer: Self-pay | Admitting: Family Medicine

## 2020-03-18 DIAGNOSIS — K219 Gastro-esophageal reflux disease without esophagitis: Secondary | ICD-10-CM

## 2020-03-21 ENCOUNTER — Ambulatory Visit
Admission: RE | Admit: 2020-03-21 | Discharge: 2020-03-21 | Disposition: A | Discharge status: Home / Self Care | Payer: 59 | Source: Ambulatory Visit | Attending: Gastroenterology | Admitting: Gastroenterology

## 2020-03-21 ENCOUNTER — Other Ambulatory Visit: Payer: Self-pay

## 2020-03-21 DIAGNOSIS — Z1211 Encounter for screening for malignant neoplasm of colon: Secondary | ICD-10-CM

## 2020-03-21 DIAGNOSIS — K56600 Partial intestinal obstruction, unspecified as to cause: Secondary | ICD-10-CM | POA: Insufficient documentation

## 2020-03-25 ENCOUNTER — Telehealth: Payer: Self-pay

## 2020-03-25 NOTE — Telephone Encounter (Signed)
-----   Message from Lucilla Lame, MD sent at 03/22/2020  8:17 AM EDT ----- Let the patient know that his small bowel follow-through showed a normal and without any strictures or narrowing seen.

## 2020-03-25 NOTE — Telephone Encounter (Signed)
Pt notified of small bowel follow through result.

## 2020-04-10 ENCOUNTER — Other Ambulatory Visit: Payer: Self-pay

## 2020-04-10 ENCOUNTER — Encounter: Payer: Self-pay | Admitting: Gastroenterology

## 2020-04-17 ENCOUNTER — Other Ambulatory Visit: Payer: Self-pay

## 2020-04-17 ENCOUNTER — Other Ambulatory Visit
Admission: RE | Admit: 2020-04-17 | Discharge: 2020-04-17 | Disposition: A | Discharge status: Home / Self Care | Payer: 59 | Source: Ambulatory Visit | Attending: Gastroenterology | Admitting: Gastroenterology

## 2020-04-17 DIAGNOSIS — Z20822 Contact with and (suspected) exposure to covid-19: Secondary | ICD-10-CM | POA: Diagnosis not present

## 2020-04-17 DIAGNOSIS — Z01812 Encounter for preprocedural laboratory examination: Secondary | ICD-10-CM | POA: Insufficient documentation

## 2020-04-17 LAB — SARS CORONAVIRUS 2 (TAT 6-24 HRS): SARS Coronavirus 2: NEGATIVE

## 2020-04-18 NOTE — Discharge Instructions (Signed)
General Anesthesia, Adult, Care After This sheet gives you information about how to care for yourself after your procedure. Your health care provider may also give you more specific instructions. If you have problems or questions, contact your health care provider. What can I expect after the procedure? After the procedure, the following side effects are common:  Pain or discomfort at the IV site.  Nausea.  Vomiting.  Sore throat.  Trouble concentrating.  Feeling cold or chills.  Weak or tired.  Sleepiness and fatigue.  Soreness and body aches. These side effects can affect parts of the body that were not involved in surgery. Follow these instructions at home:  For at least 24 hours after the procedure:  Have a responsible adult stay with you. It is important to have someone help care for you until you are awake and alert.  Rest as needed.  Do not: ? Participate in activities in which you could fall or become injured. ? Drive. ? Use heavy machinery. ? Drink alcohol. ? Take sleeping pills or medicines that cause drowsiness. ? Make important decisions or sign legal documents. ? Take care of children on your own. Eating and drinking  Follow any instructions from your health care provider about eating or drinking restrictions.  When you feel hungry, start by eating small amounts of foods that are soft and easy to digest (bland), such as toast. Gradually return to your regular diet.  Drink enough fluid to keep your urine pale yellow.  If you vomit, rehydrate by drinking water, juice, or clear broth. General instructions  If you have sleep apnea, surgery and certain medicines can increase your risk for breathing problems. Follow instructions from your health care provider about wearing your sleep device: ? Anytime you are sleeping, including during daytime naps. ? While taking prescription pain medicines, sleeping medicines, or medicines that make you drowsy.  Return to  your normal activities as told by your health care provider. Ask your health care provider what activities are safe for you.  Take over-the-counter and prescription medicines only as told by your health care provider.  If you smoke, do not smoke without supervision.  Keep all follow-up visits as told by your health care provider. This is important. Contact a health care provider if:  You have nausea or vomiting that does not get better with medicine.  You cannot eat or drink without vomiting.  You have pain that does not get better with medicine.  You are unable to pass urine.  You develop a skin rash.  You have a fever.  You have redness around your IV site that gets worse. Get help right away if:  You have difficulty breathing.  You have chest pain.  You have blood in your urine or stool, or you vomit blood. Summary  After the procedure, it is common to have a sore throat or nausea. It is also common to feel tired.  Have a responsible adult stay with you for the first 24 hours after general anesthesia. It is important to have someone help care for you until you are awake and alert.  When you feel hungry, start by eating small amounts of foods that are soft and easy to digest (bland), such as toast. Gradually return to your regular diet.  Drink enough fluid to keep your urine pale yellow.  Return to your normal activities as told by your health care provider. Ask your health care provider what activities are safe for you. This information is not   intended to replace advice given to you by your health care provider. Make sure you discuss any questions you have with your health care provider. Document Revised: 11/18/2017 Document Reviewed: 07/01/2017 Elsevier Patient Education  2020 [employer]  

## 2020-04-21 ENCOUNTER — Encounter: Payer: Self-pay | Admitting: Gastroenterology

## 2020-04-21 ENCOUNTER — Ambulatory Visit: Payer: 59 | Admitting: Anesthesiology

## 2020-04-21 ENCOUNTER — Other Ambulatory Visit: Payer: Self-pay

## 2020-04-21 ENCOUNTER — Ambulatory Visit
Admission: RE | Admit: 2020-04-21 | Discharge: 2020-04-21 | Disposition: A | Discharge status: Home / Self Care | Payer: 59 | Attending: Gastroenterology | Admitting: Gastroenterology

## 2020-04-21 ENCOUNTER — Encounter
Admission: RE | Disposition: A | Discharge status: Home / Self Care | Payer: Self-pay | Source: Home / Self Care | Attending: Gastroenterology

## 2020-04-21 DIAGNOSIS — K219 Gastro-esophageal reflux disease without esophagitis: Secondary | ICD-10-CM | POA: Diagnosis not present

## 2020-04-21 DIAGNOSIS — I129 Hypertensive chronic kidney disease with stage 1 through stage 4 chronic kidney disease, or unspecified chronic kidney disease: Secondary | ICD-10-CM | POA: Insufficient documentation

## 2020-04-21 DIAGNOSIS — Z79899 Other long term (current) drug therapy: Secondary | ICD-10-CM | POA: Insufficient documentation

## 2020-04-21 DIAGNOSIS — N529 Male erectile dysfunction, unspecified: Secondary | ICD-10-CM | POA: Insufficient documentation

## 2020-04-21 DIAGNOSIS — N189 Chronic kidney disease, unspecified: Secondary | ICD-10-CM | POA: Diagnosis not present

## 2020-04-21 DIAGNOSIS — K635 Polyp of colon: Secondary | ICD-10-CM | POA: Diagnosis not present

## 2020-04-21 DIAGNOSIS — D124 Benign neoplasm of descending colon: Secondary | ICD-10-CM | POA: Diagnosis not present

## 2020-04-21 DIAGNOSIS — Z1211 Encounter for screening for malignant neoplasm of colon: Secondary | ICD-10-CM | POA: Diagnosis present

## 2020-04-21 DIAGNOSIS — E1122 Type 2 diabetes mellitus with diabetic chronic kidney disease: Secondary | ICD-10-CM | POA: Diagnosis not present

## 2020-04-21 DIAGNOSIS — Z6834 Body mass index (BMI) 34.0-34.9, adult: Secondary | ICD-10-CM | POA: Insufficient documentation

## 2020-04-21 DIAGNOSIS — Z833 Family history of diabetes mellitus: Secondary | ICD-10-CM | POA: Insufficient documentation

## 2020-04-21 DIAGNOSIS — G4730 Sleep apnea, unspecified: Secondary | ICD-10-CM | POA: Insufficient documentation

## 2020-04-21 HISTORY — PX: COLONOSCOPY WITH PROPOFOL: SHX5780

## 2020-04-21 HISTORY — PX: POLYPECTOMY: SHX5525

## 2020-04-21 LAB — GLUCOSE, CAPILLARY
Glucose-Capillary: 101 mg/dL — ABNORMAL HIGH (ref 70–99)
Glucose-Capillary: 95 mg/dL (ref 70–99)

## 2020-04-21 SURGERY — COLONOSCOPY WITH PROPOFOL
Anesthesia: General | Site: Rectum

## 2020-04-21 MED ORDER — STERILE WATER FOR IRRIGATION IR SOLN
Status: DC | PRN
  Administered 2020-04-21: .05 mL

## 2020-04-21 MED ORDER — LACTATED RINGERS IV SOLN: INTRAVENOUS | Status: DC

## 2020-04-21 MED ORDER — LIDOCAINE HCL (CARDIAC) PF 100 MG/5ML IV SOSY
PREFILLED_SYRINGE | INTRAVENOUS | Status: DC | PRN
  Administered 2020-04-21: 30 mg via INTRAVENOUS

## 2020-04-21 MED ORDER — PROPOFOL 10 MG/ML IV BOLUS
INTRAVENOUS | Status: DC | PRN
  Administered 2020-04-21: 50 mg via INTRAVENOUS
  Administered 2020-04-21: 40 mg via INTRAVENOUS
  Administered 2020-04-21: 150 mg via INTRAVENOUS
  Administered 2020-04-21: 40 mg via INTRAVENOUS

## 2020-04-21 SURGICAL SUPPLY — 7 items
GOWN CVR UNV OPN BCK APRN NK (MISCELLANEOUS) ×4 IMPLANT
GOWN ISOL THUMB LOOP REG UNIV (MISCELLANEOUS) ×2
KIT ENDO PROCEDURE OLY (KITS) ×3 IMPLANT
MANIFOLD NEPTUNE II (INSTRUMENTS) ×1 IMPLANT
SNARE SHORT THROW 13M SML OVAL (MISCELLANEOUS) ×1 IMPLANT
TRAP ETRAP POLY (MISCELLANEOUS) ×1 IMPLANT
WATER STERILE IRR 250ML POUR (IV SOLUTION) ×3 IMPLANT

## 2020-04-21 NOTE — Anesthesia Preprocedure Evaluation (Signed)
Anesthesia Evaluation  Patient identified by MRN, date of birth, ID band Patient awake    Reviewed: Allergy & Precautions, H&P , NPO status , Patient's Chart, lab work & pertinent test results, reviewed documented beta blocker date and time   History of Anesthesia Complications (+) PONV and history of anesthetic complications  Airway Mallampati: II  TM Distance: >3 FB Neck ROM: full    Dental no notable dental hx.    Pulmonary sleep apnea ,    Pulmonary exam normal breath sounds clear to auscultation       Cardiovascular Exercise Tolerance: Good hypertension, negative cardio ROS   Rhythm:regular Rate:Normal     Neuro/Psych negative neurological ROS  negative psych ROS   GI/Hepatic Neg liver ROS, GERD  ,  Endo/Other  diabetes  Renal/GU negative Renal ROS  negative genitourinary   Musculoskeletal   Abdominal   Peds  Hematology negative hematology ROS (+)   Anesthesia Other Findings   Reproductive/Obstetrics negative OB ROS                             Anesthesia Physical Anesthesia Plan  ASA: II  Anesthesia Plan: General   Post-op Pain Management:    Induction:   PONV Risk Score and Plan: 3 and Propofol infusion, TIVA and Treatment may vary due to age or medical condition  Airway Management Planned:   Additional Equipment:   Intra-op Plan:   Post-operative Plan:   Informed Consent: I have reviewed the patients History and Physical, chart, labs and discussed the procedure including the risks, benefits and alternatives for the proposed anesthesia with the patient or authorized representative who has indicated his/her understanding and acceptance.     Dental Advisory Given  Plan Discussed with: CRNA  Anesthesia Plan Comments:         Anesthesia Quick Evaluation

## 2020-04-21 NOTE — H&P (Signed)
Lucilla Lame, MD [address] [place], [place] [address] Phone: [phone] Fax : [phone]  Primary Care Physician:  Delsa Grana, PA-C Primary Gastroenterologist:  Dr. Allen Norris  Pre-Procedure History & Physical: HPI:  Wayne Flowers is a 44 y.o. male is here for a screening colonoscopy.   Past Medical History:  Diagnosis Date  . Allergy   . Atelectasis   . Chronic kidney disease    KIDNEY STONE  . ED (erectile dysfunction)   . ED (erectile dysfunction)   . GERD (gastroesophageal reflux disease)   . Hearing loss    right  . History of renal calculi 02/19/2013  . Hypertension   . Kidney stone 02/19/2013  . Metabolic syndrome   . Morbid obesity ([place]) 09/08/2017   BMI >35 plus diabetes  . Obesity, Class I, BMI 30.0-34.9 (see actual BMI)   . Pleurisy   . PONV (postoperative nausea and vomiting)   . Sleep apnea    HAS NOT USED CPAP IN 6 MONTHS  . Type 2 diabetes mellitus ([place]) 06/23/2016    Past Surgical History:  Procedure Laterality Date  . CYSTOSCOPY W/ RETROGRADES Left 09/16/2015   Procedure: CYSTOSCOPY WITH RETROGRADE PYELOGRAM;  Surgeon: Royston Cowper, MD;  Location: [facility] ORS;  Service: Urology;  Laterality: Left;  . CYSTOSCOPY WITH URETEROSCOPY AND STENT PLACEMENT    . URETEROSCOPY WITH HOLMIUM LASER LITHOTRIPSY Left 09/16/2015   Procedure: URETEROSCOPY WITH HOLMIUM LASER LITHOTRIPSY;  Surgeon: Royston Cowper, MD;  Location: [facility] ORS;  Service: Urology;  Laterality: Left;  Marland Kitchen VASECTOMY      Prior to Admission medications   Medication Sig Start Date End Date Taking? Authorizing Provider  atorvastatin (LIPITOR) 40 MG tablet Take 1 tablet (40 mg total) by mouth at bedtime. 02/04/20  Yes Delsa Grana, PA-C  dicyclomine (BENTYL) 20 MG tablet Take 1 tablet (20 mg total) by mouth 3 (three) times daily as needed for spasms (abd cramping). 11/12/19  Yes Delsa Grana, PA-C  famotidine (PEPCID) 20 MG tablet TAKE 1 TABLET BY MOUTH 2 TIMES DAILY AS  NEEDED FOR HEARTBURN OR INDIGESTION. 03/18/20  Yes Delsa Grana, PA-C  fluticasone (FLONASE) 50 MCG/ACT nasal spray USE 2 SPRAYS INTO EACH NOSTRIL ONCE DAILY 06/04/19  Yes Poulose, Bethel Born, NP  lisinopril (ZESTRIL) 10 MG tablet Take 1 tablet (10 mg total) by mouth daily. 02/04/20  Yes Delsa Grana, PA-C  sildenafil (REVATIO) 20 MG tablet TAKE 3 TO 5 TABLETS DAILY AS NEEDED FOR ERECTILE DYSFUNCTION 06/04/19  Yes Poulose, Bethel Born, NP  TRULICITY [id number] SOPN INJECT 0.75 MG INTO THE SKIN ONCE A WEEK. 03/03/20  Yes Delsa Grana, PA-C    Allergies as of 03/21/2020 - Review Complete 03/06/2020  Allergen Reaction Noted  . Omeprazole Other (See Comments) 06/16/2015    Family History  Problem Relation Age of Onset  . Diabetes Mother   . Cancer Father        renal  . Spina bifida Sister   . Appendicitis Son   . Cancer Maternal Grandfather        black lung  . Cancer Paternal Grandmother        lung  . Colon cancer Paternal Grandmother   . Diabetes Sister     Social History   Socioeconomic History  . Marital status: Married    Spouse name: Alyse Low  . Number of children: 2  . Years of education: Not on file  . Highest education level: Not on file  Occupational History  .  Not on file  Tobacco Use  . Smoking status: Never Smoker  . Smokeless tobacco: Never Used  Substance and Sexual Activity  . Alcohol use: Yes    Alcohol/week: 0.0 - 1.0 standard drinks    Comment: rarley  . Drug use: No  . Sexual activity: Yes    Partners: Female  Other Topics Concern  . Not on file  Social History Narrative  . Not on file   Social Determinants of Health   Financial Resource Strain: Low Risk   . Difficulty of Paying Living Expenses: Not hard at all  Food Insecurity: No Food Insecurity  . Worried About Charity fundraiser in the Last Year: Never true  . Ran Out of Food in the Last Year: Never true  Transportation Needs: No Transportation Needs  . Lack of Transportation (Medical): No    . Lack of Transportation (Non-Medical): No  Physical Activity: Inactive  . Days of Exercise per Week: 0 days  . Minutes of Exercise per Session: 0 min  Stress: No Stress Concern Present  . Feeling of Stress : Only a little  Social Connections: Not Isolated  . Frequency of Communication with Friends and Family: More than three times a week  . Frequency of Social Gatherings with Friends and Family: Once a week  . Attends Religious Services: More than 4 times per year  . Active Member of Clubs or Organizations: Yes  . Attends Archivist Meetings: Never  . Marital Status: Married  Human resources officer Violence: Not At Risk  . Fear of Current or Ex-Partner: No  . Emotionally Abused: No  . Physically Abused: No  . Sexually Abused: No    Review of Systems: See HPI, otherwise negative ROS  Physical Exam: BP 127/84   Pulse 86   Temp (!) 97.5 F (36.4 C) (Temporal)   Resp 16   Ht 5\' 9"  (1.753 m)   Wt 106.1 kg   SpO2 100%   BMI 34.56 kg/m  General:   Alert,  pleasant and cooperative in NAD Head:  Normocephalic and atraumatic. Neck:  Supple; no masses or thyromegaly. Lungs:  Clear throughout to auscultation.    Heart:  Regular rate and rhythm. Abdomen:  Soft, nontender and nondistended. Normal bowel sounds, without guarding, and without rebound.   Neurologic:  Alert and  oriented x4;  grossly normal neurologically.  Impression/Plan: Wayne Flowers is now here to undergo a screening colonoscopy.  Risks, benefits, and alternatives regarding colonoscopy have been reviewed with the patient.  Questions have been answered.  All parties agreeable.

## 2020-04-21 NOTE — Anesthesia Procedure Notes (Signed)
Performed by: Tnia Anglada, CRNA Pre-anesthesia Checklist: Patient identified, Emergency Drugs available, Suction available, Timeout performed and Patient being monitored Patient Re-evaluated:Patient Re-evaluated prior to induction Oxygen Delivery Method: Nasal cannula Placement Confirmation: positive ETCO2       

## 2020-04-21 NOTE — Transfer of Care (Signed)
Immediate Anesthesia Transfer of Care Note  Patient: Wayne Flowers  Procedure(s) Performed: COLONOSCOPY WITH PROPOFOL (N/A Rectum) POLYPECTOMY (Rectum)  Patient Location: PACU  Anesthesia Type: General  Level of Consciousness: awake, alert  and patient cooperative  Airway and Oxygen Therapy: Patient Spontanous Breathing and Patient connected to supplemental oxygen  Post-op Assessment: Post-op Vital signs reviewed, Patient's Cardiovascular Status Stable, Respiratory Function Stable, Patent Airway and No signs of Nausea or vomiting  Post-op Vital Signs: Reviewed and stable  Complications: No apparent anesthesia complications

## 2020-04-21 NOTE — Op Note (Signed)
[facility] Gastroenterology Patient Name: Wayne Flowers Procedure Date: 04/21/2020 12:14 PM MRN: [id number] Account #: [id number] Date of Birth: August 04, 1976 Admit Type: Outpatient Age: 44 Room: [facility] OR ROOM 01 Gender: Male Note Status: Finalized Procedure:             Colonoscopy Indications:           Screening for colorectal malignant neoplasm Providers:             Lucilla Lame MD, MD Referring MD:          Delsa Grana (Referring MD) Medicines:             Propofol per Anesthesia Complications:         No immediate complications. Procedure:             Pre-Anesthesia Assessment:                        - Prior to the procedure, a History and Physical was                         performed, and patient medications and allergies were                         reviewed. The patient's tolerance of previous                         anesthesia was also reviewed. The risks and benefits                         of the procedure and the sedation options and risks                         were discussed with the patient. All questions were                         answered, and informed consent was obtained. Prior                         Anticoagulants: The patient has taken no previous                         anticoagulant or antiplatelet agents. ASA Grade                         Assessment: II - A patient with mild systemic disease.                         After reviewing the risks and benefits, the patient                         was deemed in satisfactory condition to undergo the                         procedure.                        After obtaining informed consent, the colonoscope was  passed under direct vision. Throughout the procedure,                         the patient's blood pressure, pulse, and oxygen                         saturations were monitored continuously. The                         Colonoscope was introduced through the anus  and                         advanced to the the cecum, identified by appendiceal                         orifice and ileocecal valve. The colonoscopy was                         performed without difficulty. The patient tolerated                         the procedure well. The quality of the bowel                         preparation was excellent. Findings:      The perianal and digital rectal examinations were normal.      A 3 mm polyp was found in the descending colon. The polyp was sessile.       The polyp was removed with a hot snare. Resection and retrieval were       complete. Impression:            - One 3 mm polyp in the descending colon, removed with                         a hot snare. Resected and retrieved. Recommendation:        - Discharge patient to home.                        - Resume previous diet.                        - Continue present medications.                        - Await pathology results.                        - Repeat colonoscopy in 5 years if polyp adenoma and                         10 years if hyperplastic Procedure Code(s):     --- Professional ---                        [phone], Colonoscopy, flexible; with removal of                         tumor(s), polyp(s), or other lesion(s) by snare  technique Diagnosis Code(s):     --- Professional ---                        Z12.11, Encounter for screening for malignant neoplasm                         of colon                        K63.5, Polyp of colon CPT copyright 2019 American Medical Association. All rights reserved. The codes documented in this report are preliminary and upon coder review may  be revised to meet current compliance requirements. Lucilla Lame MD, MD 04/21/2020 12:37:00 PM This report has been signed electronically. Number of Addenda: 0 Note Initiated On: 04/21/2020 12:14 PM Scope Withdrawal Time: 0 hours 6 minutes 40 seconds  Total Procedure Duration: 0 hours 8  minutes 2 seconds  Estimated Blood Loss:  Estimated blood loss: none.      [facility]

## 2020-04-21 NOTE — Anesthesia Postprocedure Evaluation (Signed)
Anesthesia Post Note  Patient: Wayne Flowers  Procedure(s) Performed: COLONOSCOPY WITH PROPOFOL (N/A Rectum) POLYPECTOMY (Rectum)     Patient location during evaluation: PACU Anesthesia Type: General Level of consciousness: awake and alert Pain management: pain level controlled Vital Signs Assessment: post-procedure vital signs reviewed and stable Respiratory status: spontaneous breathing, nonlabored ventilation, respiratory function stable and patient connected to nasal cannula oxygen Cardiovascular status: blood pressure returned to baseline and stable Postop Assessment: no apparent nausea or vomiting Anesthetic complications: no    Alisa Graff

## 2020-04-22 ENCOUNTER — Encounter: Payer: Self-pay | Admitting: *Deleted

## 2020-04-23 ENCOUNTER — Encounter: Payer: Self-pay | Admitting: Gastroenterology

## 2020-04-23 LAB — SURGICAL PATHOLOGY

## 2020-05-14 ENCOUNTER — Other Ambulatory Visit: Payer: Self-pay | Admitting: Family Medicine

## 2020-05-14 ENCOUNTER — Other Ambulatory Visit: Payer: Self-pay

## 2020-05-14 DIAGNOSIS — K219 Gastro-esophageal reflux disease without esophagitis: Secondary | ICD-10-CM

## 2020-05-14 MED ORDER — FAMOTIDINE 20 MG PO TABS: ORAL_TABLET | ORAL | 3 refills | Status: DC

## 2020-06-03 ENCOUNTER — Other Ambulatory Visit: Payer: Self-pay | Admitting: Family Medicine

## 2020-06-03 DIAGNOSIS — N529 Male erectile dysfunction, unspecified: Secondary | ICD-10-CM

## 2020-08-08 NOTE — Progress Notes (Signed)
Name: Wayne Flowers   MRN: [id number]    DOB: September 14, 1976   Date:08/11/2020       Progress Note  Chief Complaint  Patient presents with  . Follow-up  . Diabetes  . Hypertension  . Hyperlipidemia     Subjective:   Wayne Flowers is a 44 y.o. male, presents to clinic for routine f/up  DM:  Usually well controlled, but last labs A1C >7.8 Pt managing DM with trulicity 9.73, decreased dose due to GI SE Reports good med compliance Pt has no SE from meds. Blood sugars patient stated he do not check Denies: Polyuria, polydipsia, vision changes, neuropathy, hypoglycemia Recent pertinent labs: Lab Results  Component Value Date   HGBA1C 7.8 (H) 02/04/2020   HGBA1C 6.8 (H) 11/06/2019   HGBA1C 6.6 (H) 06/04/2019   Standard of care and health maintenance: Foot exam:  UTD DM eye exam: he is going back to [facility] in the next couple months ACEI/ARB:  lisinopril Statin:  lipitor   Hypertension:  Currently managed on lisinopril 10 qd Pt reports good med compliance and denies any SE.   Blood pressure today is well controlled. BP Readings from Last 3 Encounters:  08/11/20 122/78  04/21/20 115/77  03/06/20 127/79   Pt denies CP, SOB, exertional sx, LE edema, palpitation, Ha's, visual disturbances, lightheadedness, hypotension, syncope  Hyperlipidemia: Currently treated with lipitor 40 qd, pt reports good med compliance Last Lipids: Lab Results  Component Value Date   CHOL 130 02/04/2020   HDL 44 02/04/2020   LDLCALC 70 02/04/2020   TRIG 82 02/04/2020   CHOLHDL 3.0 02/04/2020   - Denies: Chest pain, shortness of breath, myalgias, claudication - last lipids well controlled and at goal  ED: He asks for refill and more # of pills of sildenafil, typically using 60 mg dose each time for sexual activity  GERD: Uses pepcid daily, occasionally has reflux or indigestion sx with certain food triggers or if he eats late    Obesity: He denies any  significant weight or diet changes, he has some good weeks and some bad weeks in regards to diet/ Weight up about 8 lbs since last OV  Wt Readings from Last 5 Encounters:  08/11/20 242 lb 14.4 oz (110.2 kg)  04/21/20 234 lb (106.1 kg)  03/06/20 242 lb 3.2 oz (109.9 kg)  02/04/20 239 lb 9.6 oz (108.7 kg)  01/23/20 236 lb 6.4 oz (107.2 kg)   BMI Readings from Last 5 Encounters:  08/11/20 35.87 kg/m  04/21/20 34.56 kg/m  03/06/20 35.77 kg/m  02/04/20 35.38 kg/m  01/23/20 34.91 kg/m      Current Outpatient Medications:  .  atorvastatin (LIPITOR) 40 MG tablet, Take 1 tablet (40 mg total) by mouth at bedtime., Disp: 90 tablet, Rfl: 3 .  dicyclomine (BENTYL) 20 MG tablet, Take 1 tablet (20 mg total) by mouth 3 (three) times daily as needed for spasms (abd cramping)., Disp: 60 tablet, Rfl: 1 .  Dulaglutide (TRULICITY) 5.32 DJ/2.4QA SOPN, Inject 0.75 mg into the skin once a week., Disp: 6 mL, Rfl: 3 .  famotidine (PEPCID) 20 MG tablet, TAKE 1 TABLET BY MOUTH 2 TIMES DAILY AS NEEDED FOR HEARTBURN OR INDIGESTION., Disp: 180 tablet, Rfl: 3 .  fluticasone (FLONASE) 50 MCG/ACT nasal spray, USE 2 SPRAYS INTO EACH NOSTRIL ONCE DAILY, Disp: 48 g, Rfl: 3 .  lisinopril (ZESTRIL) 10 MG tablet, Take 1 tablet (10 mg total) by mouth daily., Disp: 90 tablet, Rfl: 3 .  sildenafil (  REVATIO) 20 MG tablet, TAKE 3 TO 5 TABLETS BY MOUTH DAILY AS NEEDED FOR ERECTILE DYSFUNCTION, Disp: 15 tablet, Rfl: 5 .  sildenafil (VIAGRA) 50 MG tablet, Take 1-2 tablets (50-100 mg total) by mouth daily as needed for erectile dysfunction (30 mg prior to sexual activity)., Disp: 30 tablet, Rfl: 3  Patient Active Problem List   Diagnosis Date Noted  . Special screening for malignant neoplasms, colon   . Polyp of descending colon   . Bilateral calf pain 02/04/2020  . Partial small bowel obstruction ([place]) 01/25/2020  . Class 2 severe obesity with serious comorbidity and body mass index (BMI) of 35.0 to 35.9 in adult ([place])  09/08/2017  . Hearing loss 03/11/2017  . Hyperlipidemia LDL goal <70 07/19/2016  . Medication monitoring encounter 07/19/2016  . Type 2 diabetes mellitus ([place]) 06/23/2016  . Allergic rhinitis with postnasal drip 12/22/2015  . Obstructive sleep apnea treated with continuous positive airway pressure (CPAP) 06/16/2015  . Hypertension goal BP (blood pressure) < 140/90 06/16/2015  . GERD without esophagitis 06/16/2015  . Stiffness of right shoulder joint 06/16/2015  . 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 Low libido 08/22/2013  . Right upper quadrant pain 02/19/2013    Past Surgical History:  Procedure Laterality Date  . COLONOSCOPY WITH PROPOFOL N/A 04/21/2020   Procedure: COLONOSCOPY WITH PROPOFOL;  Surgeon: Lucilla Lame, MD;  Location: [place];  Service: Endoscopy;  Laterality: N/A;  . CYSTOSCOPY W/ RETROGRADES Left 09/16/2015   Procedure: CYSTOSCOPY WITH RETROGRADE PYELOGRAM;  Surgeon: Royston Cowper, MD;  Location: [facility];  Service: Urology;  Laterality: Left;  . CYSTOSCOPY WITH URETEROSCOPY AND STENT PLACEMENT    . POLYPECTOMY  04/21/2020   Procedure: POLYPECTOMY;  Surgeon: Lucilla Lame, MD;  Location: [place];  Service: Endoscopy;;  . URETEROSCOPY WITH HOLMIUM LASER LITHOTRIPSY Left 09/16/2015   Procedure: URETEROSCOPY WITH HOLMIUM LASER LITHOTRIPSY;  Surgeon: Royston Cowper, MD;  Location: [facility];  Service: Urology;  Laterality: Left;  Marland Kitchen VASECTOMY      Family History  Problem Relation Age of Onset  . Diabetes Mother   . Cancer Father        renal  . Spina bifida Sister   . Appendicitis Son   . Cancer Maternal Grandfather        black lung  . Cancer Paternal Grandmother        lung  . Colon cancer Paternal Grandmother   . Diabetes Sister     Social History   Tobacco Use  . Smoking status: Never Smoker  . Smokeless tobacco: Never Used  Vaping Use  . Vaping Use: Never used  Substance Use Topics  . Alcohol use: Yes     Alcohol/week: 0.0 - 1.0 standard drinks    Comment: rarley  . Drug use: No     Allergies  Allergen Reactions  . Omeprazole Other (See Comments)    Reaction:  Memory loss     Health Maintenance  Topic Date Due  . OPHTHALMOLOGY EXAM  08/30/2019  . HEMOGLOBIN A1C  08/06/2020  . FOOT EXAM  02/03/2021  . COLONOSCOPY  04/21/2025  . TETANUS/TDAP  06/03/2029  . INFLUENZA VACCINE  Completed  . PNEUMOCOCCAL POLYSACCHARIDE VACCINE AGE 80-64 HIGH RISK  Completed  . COVID-19 Vaccine  Completed  . Hepatitis C Screening  Completed  . HIV Screening  Completed    Chart Review Today: I personally reviewed active problem list, medication list, allergies, family history, social history, health  maintenance, notes from last encounter, lab results, imaging with the patient/caregiver today.   Review of Systems  10 Systems reviewed and are negative for acute change except as noted in the HPI.  Objective:   Vitals:   08/11/20 0849  BP: 122/78  Pulse: 76  Resp: 18  Temp: 98 F (36.7 C)  TempSrc: Oral  SpO2: 98%  Weight: 242 lb 14.4 oz (110.2 kg)  Height: 5\' 9"  (1.753 m)    Body mass index is 35.87 kg/m.  Physical Exam Vitals and nursing note reviewed.  Constitutional:      General: He is not in acute distress.    Appearance: Normal appearance. He is well-developed. He is obese. He is not ill-appearing, toxic-appearing or diaphoretic.     Interventions: Face mask in place.  HENT:     Head: Normocephalic and atraumatic.     Jaw: No trismus.     Right Ear: External ear normal.     Left Ear: External ear normal.  Eyes:     General: Lids are normal. No scleral icterus.       Right eye: No discharge.        Left eye: No discharge.     Conjunctiva/sclera: Conjunctivae normal.  Neck:     Trachea: Trachea and phonation normal. No tracheal deviation.  Cardiovascular:     Rate and Rhythm: Normal rate and regular rhythm.     Pulses: Normal pulses.          Radial pulses are 2+ on the  right side and 2+ on the left side.       Posterior tibial pulses are 2+ on the right side and 2+ on the left side.     Heart sounds: Normal heart sounds. No murmur heard.  No friction rub. No gallop.   Pulmonary:     Effort: Pulmonary effort is normal. No respiratory distress.     Breath sounds: Normal breath sounds. No stridor. No wheezing, rhonchi or rales.  Abdominal:     General: Bowel sounds are normal. There is no distension.     Palpations: Abdomen is soft.  Musculoskeletal:     Right lower leg: No edema.     Left lower leg: No edema.  Skin:    General: Skin is warm and dry.     Coloration: Skin is not jaundiced.     Findings: No rash.     Nails: There is no clubbing.  Neurological:     Mental Status: He is alert. Mental status is at baseline.     Cranial Nerves: No dysarthria or facial asymmetry.     Motor: No tremor or abnormal muscle tone.     Gait: Gait normal.  Psychiatric:        Mood and Affect: Mood normal.        Speech: Speech normal.        Behavior: Behavior normal. Behavior is cooperative.         Assessment & Plan:     ICD-10-CM   1. Hypertension goal BP (blood pressure) < 140/90  V69 COMPLETE METABOLIC PANEL WITH GFR   stable, well controlled on lisinopril 10 mg  2. Hyperlipidemia LDL goal <70  I50.3 COMPLETE METABOLIC PANEL WITH GFR   stable, well controlled on statin, last lipid panel was excellent - repeat labs in 6 months (annually)  3. Type 2 diabetes mellitus with other specified complication, without long-term current use of insulin (HCC)  E11.69 Hemoglobin A1C  COMPLETE METABOLIC PANEL WITH GFR    Dulaglutide (TRULICITY) 4.09 WJ/1.9JY SOPN   uncontrolled last OV, no change in diet or lifestyle, previously decreased trulicity dose due to GI SE/ileus, A1C today, add meds if still uncontrolled  4. ED (erectile dysfunction) of organic origin  N52.9 sildenafil (VIAGRA) 50 MG tablet   refill on meds/dose change   5. Class 2 severe obesity with  serious comorbidity and body mass index (BMI) of 35.0 to 35.9 in adult, unspecified obesity type (HCC)  E66.01 Hemoglobin A1C   N82.95 COMPLETE METABOLIC PANEL WITH GFR   encouraged diet and exercise  6. Gastroesophageal reflux disease, unspecified whether esophagitis present  K21.9    well controlled symptoms on pepcid   7. Allergic rhinitis with postnasal drip  J30.9    R09.82    managing with flonase daily - no current concerns  8. Encounter for medication monitoring  Z51.81 Hemoglobin A1C    COMPLETE METABOLIC PANEL WITH GFR  9. Need for influenza vaccination  Z23 Flu Vaccine QUAD 6+ mos PF IM (Fluarix Quad PF)     Return in about 6 months (around 02/08/2021).   Delsa Grana, PA-C 08/11/20 9:35 AM

## 2020-08-11 ENCOUNTER — Encounter: Payer: Self-pay | Admitting: Family Medicine

## 2020-08-11 ENCOUNTER — Ambulatory Visit: Payer: 59 | Admitting: Family Medicine

## 2020-08-11 ENCOUNTER — Other Ambulatory Visit: Payer: Self-pay

## 2020-08-11 ENCOUNTER — Other Ambulatory Visit: Payer: Self-pay | Admitting: Family Medicine

## 2020-08-11 VITALS — BP 122/78 | HR 76 | Temp 98.0°F | Resp 18 | Ht 69.0 in | Wt 242.9 lb

## 2020-08-11 DIAGNOSIS — E1169 Type 2 diabetes mellitus with other specified complication: Secondary | ICD-10-CM

## 2020-08-11 DIAGNOSIS — E785 Hyperlipidemia, unspecified: Secondary | ICD-10-CM

## 2020-08-11 DIAGNOSIS — Z5181 Encounter for therapeutic drug level monitoring: Secondary | ICD-10-CM

## 2020-08-11 DIAGNOSIS — I10 Essential (primary) hypertension: Secondary | ICD-10-CM | POA: Diagnosis not present

## 2020-08-11 DIAGNOSIS — N529 Male erectile dysfunction, unspecified: Secondary | ICD-10-CM

## 2020-08-11 DIAGNOSIS — K219 Gastro-esophageal reflux disease without esophagitis: Secondary | ICD-10-CM

## 2020-08-11 DIAGNOSIS — Z6835 Body mass index (BMI) 35.0-35.9, adult: Secondary | ICD-10-CM

## 2020-08-11 DIAGNOSIS — E66812 Obesity, class 2: Secondary | ICD-10-CM

## 2020-08-11 DIAGNOSIS — Z23 Encounter for immunization: Secondary | ICD-10-CM | POA: Diagnosis not present

## 2020-08-11 DIAGNOSIS — J309 Allergic rhinitis, unspecified: Secondary | ICD-10-CM

## 2020-08-11 DIAGNOSIS — R0982 Postnasal drip: Secondary | ICD-10-CM

## 2020-08-11 MED ORDER — TRULICITY 0.75 MG/0.5ML ~~LOC~~ SOAJ: 0.7500 mg | SUBCUTANEOUS | 3 refills | Status: DC

## 2020-08-11 MED ORDER — SILDENAFIL CITRATE 50 MG PO TABS: 50.0000 mg | ORAL_TABLET | Freq: Every day | ORAL | 3 refills | Status: DC | PRN

## 2020-08-11 NOTE — Patient Instructions (Signed)
If your blood sugars are still too high we may consider adding glipizide or another similar medicine to help lower them. You can also work on lower carb and sugar diet and any exercising or loosing weight will help you A1C.   Diabetes Mellitus and Nutrition, Adult When you have diabetes (diabetes mellitus), it is very important to have healthy eating habits because your blood sugar (glucose) levels are greatly affected by what you eat and drink. Eating healthy foods in the appropriate amounts, at about the same times every day, can help you:  Control your blood glucose.  Lower your risk of heart disease.  Improve your blood pressure.  Reach or maintain a healthy weight. Every person with diabetes is different, and each person has different needs for a meal plan. Your health care provider may recommend that you work with a diet and nutrition specialist (dietitian) to make a meal plan that is best for you. Your meal plan may vary depending on factors such as:  The calories you need.  The medicines you take.  Your weight.  Your blood glucose, blood pressure, and cholesterol levels.  Your activity level.  Other health conditions you have, such as heart or kidney disease. How do carbohydrates affect me? Carbohydrates, also called carbs, affect your blood glucose level more than any other type of food. Eating carbs naturally raises the amount of glucose in your blood. Carb counting is a method for keeping track of how many carbs you eat. Counting carbs is important to keep your blood glucose at a healthy level, especially if you use insulin or take certain oral diabetes medicines. It is important to know how many carbs you can safely have in each meal. This is different for every person. Your dietitian can help you calculate how many carbs you should have at each meal and for each snack. Foods that contain carbs include:  Bread, cereal, rice, pasta, and crackers.  Potatoes and  corn.  Peas, beans, and lentils.  Milk and yogurt.  Fruit and juice.  Desserts, such as cakes, cookies, ice cream, and candy. How does alcohol affect me? Alcohol can cause a sudden decrease in blood glucose (hypoglycemia), especially if you use insulin or take certain oral diabetes medicines. Hypoglycemia can be a life-threatening condition. Symptoms of hypoglycemia (sleepiness, dizziness, and confusion) are similar to symptoms of having too much alcohol. If your health care provider says that alcohol is safe for you, follow these guidelines:  Limit alcohol intake to no more than 1 drink per day for nonpregnant women and 2 drinks per day for men. One drink equals 12 oz of beer, 5 oz of wine, or 1 oz of hard liquor.  Do not drink on an empty stomach.  Keep yourself hydrated with water, diet soda, or unsweetened iced tea.  Keep in mind that regular soda, juice, and other mixers may contain a lot of sugar and must be counted as carbs. What are tips for following this plan?  Reading food labels  Start by checking the serving size on the "Nutrition Facts" label of packaged foods and drinks. The amount of calories, carbs, fats, and other nutrients listed on the label is based on one serving of the item. Many items contain more than one serving per package.  Check the total grams (g) of carbs in one serving. You can calculate the number of servings of carbs in one serving by dividing the total carbs by 15. For example, if a food has 30 g  of total carbs, it would be equal to 2 servings of carbs.  Check the number of grams (g) of saturated and trans fats in one serving. Choose foods that have low or no amount of these fats.  Check the number of milligrams (mg) of salt (sodium) in one serving. Most people should limit total sodium intake to less than 2,300 mg per day.  Always check the nutrition information of foods labeled as "low-fat" or "nonfat". These foods may be higher in added sugar or  refined carbs and should be avoided.  Talk to your dietitian to identify your daily goals for nutrients listed on the label. Shopping  Avoid buying canned, premade, or processed foods. These foods tend to be high in fat, sodium, and added sugar.  Shop around the outside edge of the grocery store. This includes fresh fruits and vegetables, bulk grains, fresh meats, and fresh dairy. Cooking  Use low-heat cooking methods, such as baking, instead of high-heat cooking methods like deep frying.  Cook using healthy oils, such as olive, canola, or sunflower oil.  Avoid cooking with butter, cream, or high-fat meats. Meal planning  Eat meals and snacks regularly, preferably at the same times every day. Avoid going long periods of time without eating.  Eat foods high in fiber, such as fresh fruits, vegetables, beans, and whole grains. Talk to your dietitian about how many servings of carbs you can eat at each meal.  Eat 4-6 ounces (oz) of lean protein each day, such as lean meat, chicken, fish, eggs, or tofu. One oz of lean protein is equal to: ? 1 oz of meat, chicken, or fish. ? 1 egg. ?  cup of tofu.  Eat some foods each day that contain healthy fats, such as avocado, nuts, seeds, and fish. Lifestyle  Check your blood glucose regularly.  Exercise regularly as told by your health care provider. This may include: ? 150 minutes of moderate-intensity or vigorous-intensity exercise each week. This could be brisk walking, biking, or water aerobics. ? Stretching and doing strength exercises, such as yoga or weightlifting, at least 2 times a week.  Take medicines as told by your health care provider.  Do not use any products that contain nicotine or tobacco, such as cigarettes and e-cigarettes. If you need help quitting, ask your health care provider.  Work with a Social worker or diabetes educator to identify strategies to manage stress and any emotional and social challenges. Questions to ask a  health care provider  Do I need to meet with a diabetes educator?  Do I need to meet with a dietitian?  What number can I call if I have questions?  When are the best times to check my blood glucose? Where to find more information:  American Diabetes Association: diabetes.org  Academy of Nutrition and Dietetics: www.eatright.[employer] of Diabetes and Digestive and Kidney Diseases (NIH): [employer] Summary  A healthy meal plan will help you control your blood glucose and maintain a healthy lifestyle.  Working with a diet and nutrition specialist (dietitian) can help you make a meal plan that is best for you.  Keep in mind that carbohydrates (carbs) and alcohol have immediate effects on your blood glucose levels. It is important to count carbs and to use alcohol carefully. This information is not intended to replace advice given to you by your health care provider. Make sure you discuss any questions you have with your health care provider. Document Revised: 10/28/2017 Document Reviewed: 12/20/2016 Elsevier Patient Education  [place].

## 2020-08-12 LAB — COMPLETE METABOLIC PANEL WITH GFR
AG Ratio: 1.9 (calc) (ref 1.0–2.5)
ALT: 42 U/L (ref 9–46)
AST: 27 U/L (ref 10–40)
Albumin: 4.4 g/dL (ref 3.6–5.1)
Alkaline phosphatase (APISO): 74 U/L (ref 36–130)
BUN: 15 mg/dL (ref 7–25)
CO2: 26 mmol/L (ref 20–32)
Calcium: 9 mg/dL (ref 8.6–10.3)
Chloride: 103 mmol/L (ref 98–110)
Creat: 0.86 mg/dL (ref 0.60–1.35)
GFR, Est African American: 121 mL/min/{1.73_m2} (ref >=60)
GFR, Est Non African American: 105 mL/min/{1.73_m2} (ref >=60)
Globulin: 2.3 g/dL (calc) (ref 1.9–3.7)
Glucose, Bld: 199 mg/dL — ABNORMAL HIGH (ref 65–99)
Potassium: 4.9 mmol/L (ref 3.5–5.3)
Sodium: 138 mmol/L (ref 135–146)
Total Bilirubin: 0.4 mg/dL (ref 0.2–1.2)
Total Protein: 6.7 g/dL (ref 6.1–8.1)

## 2020-08-12 LAB — HEMOGLOBIN A1C
Hgb A1c MFr Bld: 9.2 % of total Hgb — ABNORMAL HIGH (ref <5.7)
Mean Plasma Glucose: 217 (calc)
eAG (mmol/L): 12 (calc)

## 2020-08-13 ENCOUNTER — Other Ambulatory Visit: Payer: Self-pay

## 2020-08-13 ENCOUNTER — Other Ambulatory Visit: Payer: Self-pay | Admitting: Family Medicine

## 2020-08-13 DIAGNOSIS — E1165 Type 2 diabetes mellitus with hyperglycemia: Secondary | ICD-10-CM

## 2020-08-13 MED ORDER — BLOOD GLUCOSE MONITOR KIT: PACK | 0 refills | Status: DC

## 2020-08-14 ENCOUNTER — Ambulatory Visit: Payer: 59 | Admitting: Family Medicine

## 2020-08-26 NOTE — Progress Notes (Signed)
Name: Wayne Flowers   MRN: [id number]    DOB: Aug 15, 1976   Date:08/27/2020       Progress Note  Chief Complaint  Patient presents with  . Diabetes    feels blood sugars are too high 180-200     Subjective:   Wayne Flowers is a 44 y.o. male, presents to clinic for follow-up on uncontrolled diabetes-here to discuss lab work and medication options  DM:  Uncontrolled Pt managing DM with Trulicity 0.53 mg weekly dose, he previously was on a higher dose 1.5 but he had an ileus and the medication was decreased he has had a slow increase of A1c since that time, he tried Metformin briefly at one point time it caused severe stomach upset and diarrhea he could never take more than 1 pill not on it for very long he does not recall ever being on any other medications besides Trulicity for diabetes management Recent pertinent labs: Lab Results  Component Value Date   HGBA1C 9.2 (H) 08/11/2020   HGBA1C 7.8 (H) 02/04/2020   HGBA1C 6.8 (H) 11/06/2019   He brings in his blood sugar meter he has been checking his blood sugar most often for second the morning before eating his glucose has been 180- 200+, no symptoms  He is starting to work on diet and exercise wants to get back into the gym    Current Outpatient Medications:  .  atorvastatin (LIPITOR) 40 MG tablet, Take 1 tablet (40 mg total) by mouth at bedtime., Disp: 90 tablet, Rfl: 3 .  blood glucose meter kit and supplies KIT, Dispense based on patient and insurance preference. Use once daily for fasting blood sugar monitoring for Dx T2DM uncontrolled, ICD 10-E11.65, Disp: 1 each, Rfl: 0 .  dicyclomine (BENTYL) 20 MG tablet, Take 1 tablet (20 mg total) by mouth 3 (three) times daily as needed for spasms (abd cramping)., Disp: 60 tablet, Rfl: 1 .  Dulaglutide (TRULICITY) 9.76 BH/4.1PF SOPN, Inject 0.75 mg into the skin once a week., Disp: 6 mL, Rfl: 3 .  famotidine (PEPCID) 20 MG tablet, TAKE 1 TABLET BY MOUTH 2 TIMES DAILY  AS NEEDED FOR HEARTBURN OR INDIGESTION., Disp: 180 tablet, Rfl: 3 .  fluticasone (FLONASE) 50 MCG/ACT nasal spray, USE 2 SPRAYS INTO EACH NOSTRIL ONCE DAILY, Disp: 48 g, Rfl: 3 .  lisinopril (ZESTRIL) 10 MG tablet, Take 1 tablet (10 mg total) by mouth daily., Disp: 90 tablet, Rfl: 3 .  sildenafil (VIAGRA) 50 MG tablet, Take 1-2 tablets (50-100 mg total) by mouth daily as needed for erectile dysfunction (30 mg prior to sexual activity)., Disp: 30 tablet, Rfl: 3  Patient Active Problem List   Diagnosis Date Noted  . Uncontrolled type 2 diabetes mellitus with hyperglycemia ([place]) 08/13/2020  . Special screening for malignant neoplasms, colon   . Polyp of descending colon   . Bilateral calf pain 02/04/2020  . Partial small bowel obstruction ([place]) 01/25/2020  . Class 2 severe obesity with serious comorbidity and body mass index (BMI) of 35.0 to 35.9 in adult ([place]) 09/08/2017  . Hearing loss 03/11/2017  . Hyperlipidemia LDL goal <70 07/19/2016  . Medication monitoring encounter 07/19/2016  . Type 2 diabetes mellitus ([place]) 06/23/2016  . Allergic rhinitis with postnasal drip 12/22/2015  . Obstructive sleep apnea treated with continuous positive airway pressure (CPAP) 06/16/2015  . Hypertension goal BP (blood pressure) < 140/90 06/16/2015  . GERD without esophagitis 06/16/2015  . Stiffness of right shoulder joint 06/16/2015  . 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 Low libido 08/22/2013  . Right upper quadrant pain 02/19/2013    Past Surgical History:  Procedure Laterality Date  . COLONOSCOPY WITH PROPOFOL N/A 04/21/2020   Procedure: COLONOSCOPY WITH PROPOFOL;  Surgeon: Lucilla Lame, MD;  Location: [facility];  Service: Endoscopy;  Laterality: N/A;  . CYSTOSCOPY W/ RETROGRADES Left 09/16/2015   Procedure: CYSTOSCOPY WITH RETROGRADE PYELOGRAM;  Surgeon: Royston Cowper, MD;  Location: [facility];  Service: Urology;  Laterality: Left;  . CYSTOSCOPY WITH URETEROSCOPY AND  STENT PLACEMENT    . POLYPECTOMY  04/21/2020   Procedure: POLYPECTOMY;  Surgeon: Lucilla Lame, MD;  Location: [facility];  Service: Endoscopy;;  . URETEROSCOPY WITH HOLMIUM LASER LITHOTRIPSY Left 09/16/2015   Procedure: URETEROSCOPY WITH HOLMIUM LASER LITHOTRIPSY;  Surgeon: Royston Cowper, MD;  Location: [facility];  Service: Urology;  Laterality: Left;  Marland Kitchen VASECTOMY      Family History  Problem Relation Age of Onset  . Diabetes Mother   . Cancer Father        renal  . Spina bifida Sister   . Appendicitis Son   . Cancer Maternal Grandfather        black lung  . Cancer Paternal Grandmother        lung  . Colon cancer Paternal Grandmother   . Diabetes Sister     Social History   Tobacco Use  . Smoking status: Never Smoker  . Smokeless tobacco: Never Used  Vaping Use  . Vaping Use: Never used  Substance Use Topics  . Alcohol use: Yes    Alcohol/week: 0.0 - 1.0 standard drinks    Comment: rarley  . Drug use: No     Allergies  Allergen Reactions  . Omeprazole Other (See Comments)    Reaction:  Memory loss     Health Maintenance  Topic Date Due  . OPHTHALMOLOGY EXAM  08/30/2019  . FOOT EXAM  02/03/2021  . HEMOGLOBIN A1C  02/08/2021  . COLONOSCOPY  04/21/2025  . TETANUS/TDAP  06/03/2029  . INFLUENZA VACCINE  Completed  . PNEUMOCOCCAL POLYSACCHARIDE VACCINE AGE 93-64 HIGH RISK  Completed  . COVID-19 Vaccine  Completed  . Hepatitis C Screening  Completed  . HIV Screening  Completed    Chart Review Today: I personally reviewed active problem list, medication list, allergies, family history, social history, health maintenance, notes from last encounter, lab results, imaging with the patient/caregiver today.   Review of Systems  10 Systems reviewed and are negative for acute change except as noted in the HPI.  Objective:   Vitals:   08/27/20 0855  BP: 118/72  Pulse: 77  Resp: 18  Temp: 98 F (36.7 C)  TempSrc: Oral  SpO2: 97%  Weight: 241 lb 4.8 oz  (109.5 kg)  Height: 5' 9"  (1.753 m)    Body mass index is 35.63 kg/m.  Physical Exam Vitals and nursing note reviewed.  Constitutional:      General: He is not in acute distress.    Appearance: Normal appearance. He is well-developed. He is not ill-appearing, toxic-appearing or diaphoretic.  HENT:     Head: Normocephalic and atraumatic.  Eyes:     General:        Right eye: No discharge.        Left eye: No discharge.     Conjunctiva/sclera: Conjunctivae normal.  Neck:     Trachea: No tracheal deviation.  Cardiovascular:     Rate and Rhythm: Normal  rate and regular rhythm.  Pulmonary:     Effort: Pulmonary effort is normal. No respiratory distress.     Breath sounds: No stridor.  Musculoskeletal:        General: Normal range of motion.  Skin:    General: Skin is warm and dry.     Findings: No rash.  Neurological:     Mental Status: He is alert.     Motor: No abnormal muscle tone.     Coordination: Coordination normal.  Psychiatric:        Mood and Affect: Mood normal.        Behavior: Behavior normal.      Results for orders placed or performed in visit on 08/27/20  POCT Glucose (CBG)  Result Value Ref Range   POC Glucose 201 (A) 70 - 99 mg/dl      Assessment & Plan:     ICD-10-CM   1. Uncontrolled type 2 diabetes mellitus with hyperglycemia (HCC)  E11.65 POCT Glucose (CBG)    glipiZIDE (GLUCOTROL) 5 MG tablet  Staff checked his blood sugar from this morning it was 201.  We discussed multiple diabetes med categories and medication side effects.  Patient did want to try and increase his Trulicity dose back to 1.5 he does have 2 pens left over at home.  He thinks that he will be able to tolerate this and he does want to work on diet and exercise.  We did discuss sulfonylureas and trying glipizide in the morning with breakfast at least initially to try and get his blood sugars down.  We reviewed extensively blood sugar monitoring parameters, when to hold glipizide,  side effects and possible hypoglycemia and how to manage that.  Encouraged him to take half dose if his blood sugars are slightly out of range would like for him to initially start to get his fasting morning blood sugars between 100-150  We will follow up with him closely because of his past medical history, ileus and Trulicity use would like to be sure that he tolerates increasing the Trulicity dose.  Encouraged him to drink ample fluids, eat very small portions and let me know if he is having any concerns or side effects.   Return in about 2 weeks (around 09/10/2020) for virtual for DM hyperglycemia f/up.   Delsa Grana, PA-C 08/27/20 9:02 AM

## 2020-08-27 ENCOUNTER — Ambulatory Visit: Payer: 59 | Admitting: Family Medicine

## 2020-08-27 ENCOUNTER — Other Ambulatory Visit: Payer: Self-pay

## 2020-08-27 ENCOUNTER — Encounter: Payer: Self-pay | Admitting: Family Medicine

## 2020-08-27 VITALS — BP 118/72 | HR 77 | Temp 98.0°F | Resp 18 | Ht 69.0 in | Wt 241.3 lb

## 2020-08-27 DIAGNOSIS — E1165 Type 2 diabetes mellitus with hyperglycemia: Secondary | ICD-10-CM | POA: Diagnosis not present

## 2020-08-27 LAB — GLUCOSE, POCT (MANUAL RESULT ENTRY): POC Glucose: 201 mg/dl — AB (ref 70–99)

## 2020-08-27 MED ORDER — GLIPIZIDE 5 MG PO TABS: 5.0000 mg | ORAL_TABLET | Freq: Every day | ORAL | 0 refills | Status: DC

## 2020-08-27 NOTE — Patient Instructions (Addendum)
Increase trulicity dose to 1.5 mg weekly  Start glipizide medicine in the morning and continue to check blood sugars in the morning and if you feel like blood sugar is high or low.   Hypoglycemia Hypoglycemia is when the sugar (glucose) level in your blood is too low. Signs of low blood sugar may include:  Feeling: ? Hungry. ? Worried or nervous (anxious). ? Sweaty and clammy. ? Confused. ? Dizzy. ? Sleepy. ? Sick to your stomach (nauseous).  Having: ? A fast heartbeat. ? A headache. ? A change in your vision. ? Tingling or no feeling (numbness) around your mouth, lips, or tongue. ? Jerky movements that you cannot control (seizure).  Having trouble with: ? Moving (coordination). ? Sleeping. ? Passing out (fainting). ? Getting upset easily (irritability). Low blood sugar can happen to people who have diabetes and people who do not have diabetes. Low blood sugar can happen quickly, and it can be an emergency. Treating low blood sugar Low blood sugar is often treated by eating or drinking something sugary right away, such as:  Fruit juice, 4-6 oz (120-150 mL).  Regular soda (not diet soda), 4-6 oz (120-150 mL).  Low-fat milk, 4 oz (120 mL).  Several pieces of hard candy.  Sugar or honey, 1 Tbsp (15 mL). Treating low blood sugar if you have diabetes If you can think clearly and swallow safely, follow the 15:15 rule:  Take 15 grams of a fast-acting carb (carbohydrate). Talk with your doctor about how much you should take.  Always keep a source of fast-acting carb with you, such as: ? Sugar tablets (glucose pills). Take 3-4 pills. ? 6-8 pieces of hard candy. ? 4-6 oz (120-150 mL) of fruit juice. ? 4-6 oz (120-150 mL) of regular (not diet) soda. ? 1 Tbsp (15 mL) honey or sugar.  Check your blood sugar 15 minutes after you take the carb.  If your blood sugar is still at or below 70 mg/dL (3.9 mmol/L), take 15 grams of a carb again.  If your blood sugar does not go  above 70 mg/dL (3.9 mmol/L) after 3 tries, get help right away.  After your blood sugar goes back to normal, eat a meal or a snack within 1 hour.  Treating very low blood sugar If your blood sugar is at or below 54 mg/dL (3 mmol/L), you have very low blood sugar (severe hypoglycemia). This may also cause:  Passing out.  Jerky movements you cannot control (seizure).  Losing consciousness (coma). This is an emergency. Do not wait to see if the symptoms will go away. Get medical help right away. Call your local emergency services (911 in the [place] Do not drive yourself to the hospital. If you have very low blood sugar and you cannot eat or drink, you may need a glucagon shot (injection). A family member or friend should learn how to check your blood sugar and how to give you a glucagon shot. Ask your doctor if you need to have a glucagon shot kit at home. Follow these instructions at home: General instructions  Take over-the-counter and prescription medicines only as told by your doctor.  Stay aware of your blood sugar as told by your doctor.  Limit alcohol intake to no more than 1 drink a day for nonpregnant women and 2 drinks a day for men. One drink equals 12 oz of beer (355 mL), 5 oz of wine (148 mL), or 1 oz of hard liquor (44 mL).  Keep all follow-up  visits as told by your doctor. This is important. If you have diabetes:   Follow your diabetes care plan as told by your doctor. Make sure you: ? Know the signs of low blood sugar. ? Take your medicines as told. ? Follow your exercise and meal plan. ? Eat on time. Do not skip meals. ? Check your blood sugar as often as told by your doctor. Always check it before and after exercise. ? Follow your sick day plan when you cannot eat or drink normally. Make this plan ahead of time with your doctor.  Share your diabetes care plan with: ? Your work or school. ? People you live with.  Check your pee (urine) for ketones: ? When you  are sick. ? As told by your doctor.  Carry a card or wear jewelry that says you have diabetes. Contact a doctor if:  You have trouble keeping your blood sugar in your target range.  You have low blood sugar often. Get help right away if:  You still have symptoms after you eat or drink something sugary.  Your blood sugar is at or below 54 mg/dL (3 mmol/L).  You have jerky movements that you cannot control.  You pass out. These symptoms may be an emergency. Do not wait to see if the symptoms will go away. Get medical help right away. Call your local emergency services (911 in the [place] Do not drive yourself to the hospital. Summary  Hypoglycemia happens when the level of sugar (glucose) in your blood is too low.  Low blood sugar can happen to people who have diabetes and people who do not have diabetes. Low blood sugar can happen quickly, and it can be an emergency.  Make sure you know the signs of low blood sugar and know how to treat it.  Always keep a source of sugar (fast-acting carb) with you to treat low blood sugar. This information is not intended to replace advice given to you by your health care provider. Make sure you discuss any questions you have with your health care provider. Document Revised: 03/08/2019 Document Reviewed: 12/19/2015 Elsevier Patient Education  [place].   Hypercalcemia Hypercalcemia is when the level of calcium in a person's blood is above normal. The body needs calcium to make bones and keep them strong. Calcium also helps the muscles, nerves, brain, and heart work the way they should. Most of the calcium in the body is in the bones. There is also some calcium in the blood. Hypercalcemia can happen when calcium comes out of the bones, or when the kidneys are not able to remove calcium from the blood. Hypercalcemia can be mild or severe. What are the causes? There are many possible causes of hypercalcemia. Common causes of this condition  include:  Hyperparathyroidism. This is a condition in which the body produces too much parathyroid hormone. There are four parathyroid glands in your neck. These glands produce a chemical messenger (hormone) that helps the body absorb calcium from foods and helps your bones release calcium.  Certain kinds of cancer. Less common causes of hypercalcemia include:  Getting too much calcium or vitamin D from your diet.  Kidney failure.  Hyperthyroidism.  Severe dehydration.  Being on bed rest or being inactive for a long time.  Certain medicines.  Infections. What increases the risk? You are more likely to develop this condition if you:  Are male.  Are 3 years of age or older.  Have a family history of  hypercalcemia. What are the signs or symptoms? Mild hypercalcemia that starts slowly may not cause symptoms. Severe, sudden hypercalcemia is more likely to cause symptoms, such as:  Being more thirsty than usual.  Needing to urinate more often than usual.  Abdominal pain.  Nausea and vomiting.  Constipation.  Muscle pain, twitching, or weakness.  Feeling very tired. How is this diagnosed?  Hypercalcemia is usually diagnosed with a blood test. You may also have tests to help determine what is causing this condition, such as imaging tests and more blood tests. How is this treated? Treatment for hypercalcemia depends on the cause. Treatment may include:  Receiving fluids through an IV.  Medicines that: ? Keep calcium levels steady after receiving fluids (loop diuretics). ? Keep calcium in your bones (bisphosphonates). ? Lower the calcium level in your blood.  Surgery to remove overactive parathyroid glands.  A procedure that filters your blood to correct calcium levels (hemodialysis). Follow these instructions at home:   Take over-the-counter and prescription medicines only as told by your health care provider.  Follow instructions from your health care  provider about eating or drinking restrictions.  Drink enough fluid to keep your urine pale yellow.  Stay active. Weight-bearing exercise helps to keep calcium in your bones. Follow instructions from your health care provider about what type and level of exercise is safe for you.  Keep all follow-up visits as told by your health care provider. This is important. Contact a health care provider if you have:  A fever.  A heartbeat that is irregular or very fast.  Changes in mood, memory, or personality. Get help right away if you:  Have severe abdominal pain.  Have chest pain.  Have trouble breathing.  Become very confused and sleepy.  Lose consciousness. Summary  Hypercalcemia is when the level of calcium in a person's blood is above normal. The body needs calcium to make bones and keep them strong. Calcium also helps the muscles, nerves, brain, and heart work the way they should.  There are many possible causes of hypercalcemia, and treatment depends on the cause.  Take over-the-counter and prescription medicines only as told by your health care provider.  Follow instructions from your health care provider about eating or drinking restrictions. This information is not intended to replace advice given to you by your health care provider. Make sure you discuss any questions you have with your health care provider. Document Revised: 12/12/2018 Document Reviewed: 08/21/2018 Elsevier Patient Education  2020 [employer].

## 2020-08-28 ENCOUNTER — Encounter: Payer: Self-pay | Admitting: Family Medicine

## 2020-09-01 ENCOUNTER — Encounter: Payer: Self-pay | Admitting: Family Medicine

## 2020-09-01 ENCOUNTER — Other Ambulatory Visit: Payer: Self-pay | Admitting: Family Medicine

## 2020-09-01 MED ORDER — TRULICITY 1.5 MG/0.5ML ~~LOC~~ SOAJ
1.5000 mg | SUBCUTANEOUS | 0 refills | Status: DC
Start: 2020-09-01 — End: 2020-10-02

## 2020-09-09 NOTE — Progress Notes (Signed)
Name: Wayne Flowers   MRN: [id number]    DOB: 04/01/1976   Date:09/11/2020       Progress Note  Subjective:    I connected with  Wayne Flowers  on 09/11/20 at 10:20 AM EDT by a video enabled telemedicine application and verified that I am speaking with the correct person using two identifiers.  I discussed the limitations of evaluation and management by telemedicine and the availability of in person appointments. The patient expressed understanding and agreed to proceed. Staff also discussed with the patient that there may be a patient responsible charge related to this service. Patient Location: work Provider Location: [facility] Additional Individuals present: none  Chief Complaint  Patient presents with  . Diabetes    follow up, BS running [place] is a 44 y.o. male, presents for virtual visit for routine follow up on the conditions listed above.  Recent routine visit, A1C was higher Lab Results  Component Value Date   HGBA1C 9.2 (H) 08/11/2020   HGBA1C 7.8 (H) 02/04/2020   HGBA1C 6.8 (H) 29/12/1113   Increased trulicity back to 1.5 mg, started one week ago increased dose, tolerating well, no SE or concerns He also started new glipizide med, 5 mg taking in the am, no SE or concerns. No abd pain, N, V, bloating, weight changes. Blood sugars were 180-190's, but this week coming down to 150-160's   Patient Active Problem List   Diagnosis Date Noted  . Uncontrolled type 2 diabetes mellitus with hyperglycemia ([place]) 08/13/2020  . Special screening for malignant neoplasms, colon   . Polyp of descending colon   . Bilateral calf pain 02/04/2020  . Partial small bowel obstruction ([place]) 01/25/2020  . Class 2 severe obesity with serious comorbidity and body mass index (BMI) of 35.0 to 35.9 in adult ([place]) 09/08/2017  . Hearing loss 03/11/2017  . Hyperlipidemia LDL goal <70 07/19/2016  . Medication monitoring encounter 07/19/2016  . Type 2  diabetes mellitus ([place]) 06/23/2016  . Allergic rhinitis with postnasal drip 12/22/2015  . Obstructive sleep apnea treated with continuous positive airway pressure (CPAP) 06/16/2015  . Hypertension goal BP (blood pressure) < 140/90 06/16/2015  . GERD without esophagitis 06/16/2015  . Stiffness of right shoulder joint 06/16/2015  . 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 Flowers libido 08/22/2013  . Right upper quadrant pain 02/19/2013    Current Outpatient Medications:  .  atorvastatin (LIPITOR) 40 MG tablet, Take 1 tablet (40 mg total) by mouth at bedtime., Disp: 90 tablet, Rfl: 3 .  dicyclomine (BENTYL) 20 MG tablet, Take 1 tablet (20 mg total) by mouth 3 (three) times daily as needed for spasms (abd cramping)., Disp: 60 tablet, Rfl: 1 .  Dulaglutide (TRULICITY) 1.5 ZM/0.8YE SOPN, Inject 1.5 mg into the skin once a week., Disp: 2 mL, Rfl: 0 .  famotidine (PEPCID) 20 MG tablet, TAKE 1 TABLET BY MOUTH 2 TIMES DAILY AS NEEDED FOR HEARTBURN OR INDIGESTION., Disp: 180 tablet, Rfl: 3 .  fluticasone (FLONASE) 50 MCG/ACT nasal spray, USE 2 SPRAYS INTO EACH NOSTRIL ONCE DAILY, Disp: 48 g, Rfl: 3 .  glipiZIDE (GLUCOTROL) 5 MG tablet, Take 1 tablet (5 mg total) by mouth daily before breakfast. Hold if fasting blood sugar less than 100 or if not eating breakfast, Disp: 30 tablet, Rfl: 0 .  lisinopril (ZESTRIL) 10 MG tablet, Take 1 tablet (10 mg total) by mouth daily., Disp: 90 tablet, Rfl: 3 .  sildenafil (VIAGRA) 50 MG tablet, Take 1-2 tablets (50-100 mg total) by mouth daily as needed for erectile dysfunction (30 mg prior to sexual activity)., Disp: 30 tablet, Rfl: 3 .  blood glucose meter kit and supplies KIT, Dispense based on patient and insurance preference. Use once daily for fasting blood sugar monitoring for Dx T2DM uncontrolled, ICD 10-E11.65 (Patient not taking: Reported on 09/11/2020), Disp: 1 each, Rfl: 0 Allergies  Allergen Reactions  . Omeprazole Other (See Comments)     Reaction:  Memory loss     Past Surgical History:  Procedure Laterality Date  . COLONOSCOPY WITH PROPOFOL N/A 04/21/2020   Procedure: COLONOSCOPY WITH PROPOFOL;  Surgeon: Lucilla Lame, MD;  Location: [facility];  Service: Endoscopy;  Laterality: N/A;  . CYSTOSCOPY W/ RETROGRADES Left 09/16/2015   Procedure: CYSTOSCOPY WITH RETROGRADE PYELOGRAM;  Surgeon: Royston Cowper, MD;  Location: [facility];  Service: Urology;  Laterality: Left;  . CYSTOSCOPY WITH URETEROSCOPY AND STENT PLACEMENT    . POLYPECTOMY  04/21/2020   Procedure: POLYPECTOMY;  Surgeon: Lucilla Lame, MD;  Location: [facility];  Service: Endoscopy;;  . URETEROSCOPY WITH HOLMIUM LASER LITHOTRIPSY Left 09/16/2015   Procedure: URETEROSCOPY WITH HOLMIUM LASER LITHOTRIPSY;  Surgeon: Royston Cowper, MD;  Location: [facility];  Service: Urology;  Laterality: Left;  Marland Kitchen VASECTOMY     Family History  Problem Relation Age of Onset  . Diabetes Mother   . Cancer Father        renal  . Spina bifida Sister   . Appendicitis Son   . Cancer Maternal Grandfather        black lung  . Cancer Paternal Grandmother        lung  . Colon cancer Paternal Grandmother   . Diabetes Sister    Social History   Socioeconomic History  . Marital status: Married    Spouse name: Wayne Flowers  . Number of children: 2  . Years of education: Not on file  . Highest education level: Not on file  Occupational History  . Not on file  Tobacco Use  . Smoking status: Never Smoker  . Smokeless tobacco: Never Used  Vaping Use  . Vaping Use: Never used  Substance and Sexual Activity  . Alcohol use: Yes    Alcohol/week: 0.0 - 1.0 standard drinks    Comment: rarley  . Drug use: No  . Sexual activity: Yes    Partners: Female  Other Topics Concern  . Not on file  Social History Narrative  . Not on file   Social Determinants of Health   Financial Resource Strain: Flowers Risk   . Difficulty of Paying Living Expenses: Not hard at all  Food  Insecurity: No Food Insecurity  . Worried About Charity fundraiser in the Last Year: Never true  . Ran Out of Food in the Last Year: Never true  Transportation Needs: No Transportation Needs  . Lack of Transportation (Medical): No  . Lack of Transportation (Non-Medical): No  Physical Activity: Inactive  . Days of Exercise per Week: 0 days  . Minutes of Exercise per Session: 0 min  Stress: No Stress Concern Present  . Feeling of Stress : Only a little  Social Connections: Socially Integrated  . Frequency of Communication with Friends and Family: More than three times a week  . Frequency of Social Gatherings with Friends and Family: Once a week  . Attends Religious Services: More than 4 times per year  . Active Member of  Clubs or Organizations: Yes  . Attends Archivist Meetings: Never  . Marital Status: Married  Human resources officer Violence: Not At Risk  . Fear of Current or Ex-Partner: No  . Emotionally Abused: No  . Physically Abused: No  . Sexually Abused: No    Chart Review Today: I personally reviewed active problem list, medication list, allergies, family history, social history, health maintenance, notes from last encounter, lab results, imaging with the patient/caregiver today.   Review of Systems  10 Systems reviewed and are negative for acute change except as noted in the HPI.   Objective:    Virtual encounter, vitals limited, only able to obtain the following There were no vitals filed for this visit. There is no height or weight on file to calculate BMI. Nursing Note and Vital Signs reviewed.  Physical Exam Vitals and nursing note reviewed.  Constitutional:      Appearance: Normal appearance. He is obese.  Neurological:     Mental Status: He is alert.   well appearing  PE limited by telephone encounter  No results found for this or any previous visit (from the past 72 hour(s)).    Assessment and Plan:   1. Uncontrolled type 2 diabetes  mellitus with hyperglycemia (HCC) Pt tolerating increasing trulicity back up to 1.5 mg dose Also tolerating addition of glipizide 5 mg daily in the morning, no hypoglycemia CBGs trending down, but not yet at goal Lab Results  Component Value Date   HGBA1C 9.2 (H) 08/11/2020   Will recheck A1C q 3 months since last to see if it improves with med changes Encouraged additional efforts with diet, decreasing portions/calories, increasing physical activity - Hemoglobin A1c - COMPLETE METABOLIC PANEL WITH GFR Labs ordered today to be done after Dec 13th   I discussed the assessment and treatment plan with the patient. The patient was provided an opportunity to ask questions and all were answered. The patient agreed with the plan and demonstrated an understanding of the instructions.  The patient was advised to call back or seek an in-person evaluation if the symptoms worsen or if the condition fails to improve as anticipated.  I provided ~20 minutes of non-face-to-face time during this encounter.  Delsa Grana, PA-C 09/11/20 10:51 AM

## 2020-09-11 ENCOUNTER — Telehealth (INDEPENDENT_AMBULATORY_CARE_PROVIDER_SITE_OTHER): Payer: 59 | Admitting: Family Medicine

## 2020-09-11 ENCOUNTER — Other Ambulatory Visit: Payer: Self-pay

## 2020-09-11 ENCOUNTER — Encounter: Payer: Self-pay | Admitting: Family Medicine

## 2020-09-11 DIAGNOSIS — E1165 Type 2 diabetes mellitus with hyperglycemia: Secondary | ICD-10-CM | POA: Diagnosis not present

## 2020-09-11 NOTE — Patient Instructions (Signed)
Continue the Trulicity 1.5 mg injection weekly Please request refills from your pharmacy when you are out Please come in for an office visit sooner if you are having any concerning side effects or symptoms  Continue the glipizide once in the morning  Goal morning fasting blood sugars 80-130, but I think you will see an improvement in your A1c if you are at least 100-150  Work on healthy diet, decreasing portion of foods, calorie intake, amount of carbs and sugars in diet and increasing physical activity   Diabetes Mellitus and Nutrition, Adult When you have diabetes (diabetes mellitus), it is very important to have healthy eating habits because your blood sugar (glucose) levels are greatly affected by what you eat and drink. Eating healthy foods in the appropriate amounts, at about the same times every day, can help you:  Control your blood glucose.  Lower your risk of heart disease.  Improve your blood pressure.  Reach or maintain a healthy weight. Every person with diabetes is different, and each person has different needs for a meal plan. Your health care provider may recommend that you work with a diet and nutrition specialist (dietitian) to make a meal plan that is best for you. Your meal plan may vary depending on factors such as:  The calories you need.  The medicines you take.  Your weight.  Your blood glucose, blood pressure, and cholesterol levels.  Your activity level.  Other health conditions you have, such as heart or kidney disease. How do carbohydrates affect me? Carbohydrates, also called carbs, affect your blood glucose level more than any other type of food. Eating carbs naturally raises the amount of glucose in your blood. Carb counting is a method for keeping track of how many carbs you eat. Counting carbs is important to keep your blood glucose at a healthy level, especially if you use insulin or take certain oral diabetes medicines. It is important to know  how many carbs you can safely have in each meal. This is different for every person. Your dietitian can help you calculate how many carbs you should have at each meal and for each snack. Foods that contain carbs include:  Bread, cereal, rice, pasta, and crackers.  Potatoes and corn.  Peas, beans, and lentils.  Milk and yogurt.  Fruit and juice.  Desserts, such as cakes, cookies, ice cream, and candy. How does alcohol affect me? Alcohol can cause a sudden decrease in blood glucose (hypoglycemia), especially if you use insulin or take certain oral diabetes medicines. Hypoglycemia can be a life-threatening condition. Symptoms of hypoglycemia (sleepiness, dizziness, and confusion) are similar to symptoms of having too much alcohol. If your health care provider says that alcohol is safe for you, follow these guidelines:  Limit alcohol intake to no more than 1 drink per day for nonpregnant women and 2 drinks per day for men. One drink equals 12 oz of beer, 5 oz of wine, or 1 oz of hard liquor.  Do not drink on an empty stomach.  Keep yourself hydrated with water, diet soda, or unsweetened iced tea.  Keep in mind that regular soda, juice, and other mixers may contain a lot of sugar and must be counted as carbs. What are tips for following this plan?  Reading food labels  Start by checking the serving size on the "Nutrition Facts" label of packaged foods and drinks. The amount of calories, carbs, fats, and other nutrients listed on the label is based on one serving of the  item. Many items contain more than one serving per package.  Check the total grams (g) of carbs in one serving. You can calculate the number of servings of carbs in one serving by dividing the total carbs by 15. For example, if a food has 30 g of total carbs, it would be equal to 2 servings of carbs.  Check the number of grams (g) of saturated and trans fats in one serving. Choose foods that have low or no amount of these  fats.  Check the number of milligrams (mg) of salt (sodium) in one serving. Most people should limit total sodium intake to less than 2,300 mg per day.  Always check the nutrition information of foods labeled as "low-fat" or "nonfat". These foods may be higher in added sugar or refined carbs and should be avoided.  Talk to your dietitian to identify your daily goals for nutrients listed on the label. Shopping  Avoid buying canned, premade, or processed foods. These foods tend to be high in fat, sodium, and added sugar.  Shop around the outside edge of the grocery store. This includes fresh fruits and vegetables, bulk grains, fresh meats, and fresh dairy. Cooking  Use low-heat cooking methods, such as baking, instead of high-heat cooking methods like deep frying.  Cook using healthy oils, such as olive, canola, or sunflower oil.  Avoid cooking with butter, cream, or high-fat meats. Meal planning  Eat meals and snacks regularly, preferably at the same times every day. Avoid going long periods of time without eating.  Eat foods high in fiber, such as fresh fruits, vegetables, beans, and whole grains. Talk to your dietitian about how many servings of carbs you can eat at each meal.  Eat 4-6 ounces (oz) of lean protein each day, such as lean meat, chicken, fish, eggs, or tofu. One oz of lean protein is equal to: ? 1 oz of meat, chicken, or fish. ? 1 egg. ?  cup of tofu.  Eat some foods each day that contain healthy fats, such as avocado, nuts, seeds, and fish. Lifestyle  Check your blood glucose regularly.  Exercise regularly as told by your health care provider. This may include: ? 150 minutes of moderate-intensity or vigorous-intensity exercise each week. This could be brisk walking, biking, or water aerobics. ? Stretching and doing strength exercises, such as yoga or weightlifting, at least 2 times a week.  Take medicines as told by your health care provider.  Do not use any  products that contain nicotine or tobacco, such as cigarettes and e-cigarettes. If you need help quitting, ask your health care provider.  Work with a Social worker or diabetes educator to identify strategies to manage stress and any emotional and social challenges. Questions to ask a health care provider  Do I need to meet with a diabetes educator?  Do I need to meet with a dietitian?  What number can I call if I have questions?  When are the best times to check my blood glucose? Where to find more information:  American Diabetes Association: diabetes.org  Academy of Nutrition and Dietetics: www.eatright.[employer] of Diabetes and Digestive and Kidney Diseases (NIH): [employer] Summary  A healthy meal plan will help you control your blood glucose and maintain a healthy lifestyle.  Working with a diet and nutrition specialist (dietitian) can help you make a meal plan that is best for you.  Keep in mind that carbohydrates (carbs) and alcohol have immediate effects on your blood glucose levels. It  is important to count carbs and to use alcohol carefully. This information is not intended to replace advice given to you by your health care provider. Make sure you discuss any questions you have with your health care provider. Document Revised: 10/28/2017 Document Reviewed: 12/20/2016 Elsevier Patient Education  2020 [employer].

## 2020-09-15 ENCOUNTER — Encounter: Payer: Self-pay | Admitting: Family Medicine

## 2020-09-18 ENCOUNTER — Ambulatory Visit
Admission: RE | Admit: 2020-09-18 | Discharge: 2020-09-18 | Disposition: A | Discharge status: Home / Self Care | Payer: 59 | Source: Ambulatory Visit | Attending: Family Medicine | Admitting: Family Medicine

## 2020-09-18 ENCOUNTER — Ambulatory Visit: Payer: 59 | Admitting: Family Medicine

## 2020-09-18 ENCOUNTER — Other Ambulatory Visit: Payer: Self-pay

## 2020-09-18 ENCOUNTER — Encounter: Payer: Self-pay | Admitting: Family Medicine

## 2020-09-18 VITALS — BP 128/76 | HR 97 | Temp 98.4°F | Resp 18 | Ht 69.0 in | Wt 243.0 lb

## 2020-09-18 DIAGNOSIS — M25511 Pain in right shoulder: Secondary | ICD-10-CM | POA: Diagnosis present

## 2020-09-18 DIAGNOSIS — S90411A Abrasion, right great toe, initial encounter: Secondary | ICD-10-CM | POA: Diagnosis not present
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
## 2020-09-24 ENCOUNTER — Other Ambulatory Visit: Payer: Self-pay | Admitting: Family Medicine

## 2020-09-24 DIAGNOSIS — E1165 Type 2 diabetes mellitus with hyperglycemia: Secondary | ICD-10-CM

## 2020-09-25 LAB — HM DIABETES EYE EXAM

## 2020-09-25 NOTE — Telephone Encounter (Signed)
Patient stated he is taking the Glipizide pleases refill

## 2020-09-26 ENCOUNTER — Ambulatory Visit: Payer: 59 | Attending: Internal Medicine

## 2020-09-26 ENCOUNTER — Other Ambulatory Visit: Payer: Self-pay | Admitting: Internal Medicine

## 2020-09-26 DIAGNOSIS — Z23 Encounter for immunization: Secondary | ICD-10-CM

## 2020-09-26 NOTE — Progress Notes (Signed)
   Covid-19 Vaccination Clinic  Name:  Wayne Flowers    MRN: [id number] DOB: [birth year]  09/26/2020  [name] was observed post Covid-19 immunization for 15 minutes without incident. He was provided with Vaccine Information Sheet and instruction to access the V-Safe system.   [name] was instructed to call 911 with any severe reactions post vaccine: Marland Kitchen Difficulty breathing  . Swelling of face and throat  . A fast heartbeat  . A bad rash all over body  . Dizziness and weakness

## 2020-10-02 ENCOUNTER — Other Ambulatory Visit: Payer: Self-pay | Admitting: Family Medicine

## 2020-10-27 ENCOUNTER — Other Ambulatory Visit: Payer: Self-pay | Admitting: Family Medicine

## 2020-10-27 DIAGNOSIS — E1165 Type 2 diabetes mellitus with hyperglycemia: Secondary | ICD-10-CM

## 2020-11-25 ENCOUNTER — Other Ambulatory Visit: Payer: Self-pay | Admitting: Family Medicine

## 2020-11-25 DIAGNOSIS — E1165 Type 2 diabetes mellitus with hyperglycemia: Secondary | ICD-10-CM

## 2020-11-26 ENCOUNTER — Other Ambulatory Visit: Payer: Self-pay | Admitting: Family Medicine

## 2020-11-26 DIAGNOSIS — J309 Allergic rhinitis, unspecified: Secondary | ICD-10-CM

## 2020-11-26 NOTE — Telephone Encounter (Signed)
appt scheduled for 1.24.2022 with [facility]

## 2020-11-26 NOTE — Telephone Encounter (Signed)
Please change his appointment to earlier

## 2020-12-05 LAB — COMPLETE METABOLIC PANEL WITH GFR
AG Ratio: 1.8 (calc) (ref 1.0–2.5)
ALT: 49 U/L — ABNORMAL HIGH (ref 9–46)
AST: 27 U/L (ref 10–40)
Albumin: 4.4 g/dL (ref 3.6–5.1)
Alkaline phosphatase (APISO): 64 U/L (ref 36–130)
BUN: 17 mg/dL (ref 7–25)
CO2: 25 mmol/L (ref 20–32)
Calcium: 9.3 mg/dL (ref 8.6–10.3)
Chloride: 104 mmol/L (ref 98–110)
Creat: 1.01 mg/dL (ref 0.60–1.35)
GFR, Est African American: 103 mL/min/{1.73_m2} (ref >=60)
GFR, Est Non African American: 89 mL/min/{1.73_m2} (ref >=60)
Globulin: 2.4 g/dL (calc) (ref 1.9–3.7)
Glucose, Bld: 85 mg/dL (ref 65–99)
Potassium: 4 mmol/L (ref 3.5–5.3)
Sodium: 139 mmol/L (ref 135–146)
Total Bilirubin: 0.5 mg/dL (ref 0.2–1.2)
Total Protein: 6.8 g/dL (ref 6.1–8.1)

## 2020-12-05 LAB — HEMOGLOBIN A1C
Hgb A1c MFr Bld: 7.9 % of total Hgb — ABNORMAL HIGH (ref <5.7)
Mean Plasma Glucose: 180 mg/dL
eAG (mmol/L): 10 mmol/L

## 2020-12-22 ENCOUNTER — Telehealth: Payer: 59 | Admitting: Family Medicine

## 2020-12-24 ENCOUNTER — Other Ambulatory Visit: Payer: Self-pay | Admitting: Family Medicine

## 2020-12-24 DIAGNOSIS — E1165 Type 2 diabetes mellitus with hyperglycemia: Secondary | ICD-10-CM

## 2020-12-24 NOTE — Telephone Encounter (Signed)
Has appt on 2/10

## 2021-01-08 ENCOUNTER — Encounter: Payer: Self-pay | Admitting: Family Medicine

## 2021-01-08 ENCOUNTER — Other Ambulatory Visit: Payer: Self-pay

## 2021-01-08 ENCOUNTER — Ambulatory Visit: Payer: 59 | Admitting: Family Medicine

## 2021-01-08 ENCOUNTER — Other Ambulatory Visit: Payer: Self-pay | Admitting: Family Medicine

## 2021-01-08 VITALS — BP 118/70 | HR 96 | Temp 98.1°F | Resp 14 | Ht 69.0 in | Wt 241.6 lb

## 2021-01-08 DIAGNOSIS — Z6835 Body mass index (BMI) 35.0-35.9, adult: Secondary | ICD-10-CM

## 2021-01-08 DIAGNOSIS — E785 Hyperlipidemia, unspecified: Secondary | ICD-10-CM

## 2021-01-08 DIAGNOSIS — E1165 Type 2 diabetes mellitus with hyperglycemia: Secondary | ICD-10-CM

## 2021-01-08 DIAGNOSIS — N529 Male erectile dysfunction, unspecified: Secondary | ICD-10-CM

## 2021-01-08 DIAGNOSIS — I10 Essential (primary) hypertension: Secondary | ICD-10-CM | POA: Diagnosis not present

## 2021-01-08 DIAGNOSIS — E1169 Type 2 diabetes mellitus with other specified complication: Secondary | ICD-10-CM | POA: Diagnosis not present

## 2021-01-08 DIAGNOSIS — M25511 Pain in right shoulder: Secondary | ICD-10-CM | POA: Diagnosis not present

## 2021-01-08 DIAGNOSIS — K219 Gastro-esophageal reflux disease without esophagitis: Secondary | ICD-10-CM

## 2021-01-08 MED ORDER — ATORVASTATIN CALCIUM 40 MG PO TABS: 40.0000 mg | ORAL_TABLET | Freq: Every day | ORAL | 3 refills | Status: DC

## 2021-01-08 MED ORDER — LISINOPRIL 10 MG PO TABS: 10.0000 mg | ORAL_TABLET | Freq: Every day | ORAL | 3 refills | Status: DC

## 2021-01-08 NOTE — Patient Instructions (Addendum)
Labs after April 6th - come in and complete  Try the freestyle Libre 2 meter -    Lab Results  Component Value Date   HGBA1C 7.9 (H) 12/04/2020   HGBA1C 9.2 (H) 08/11/2020   HGBA1C 7.8 (H) 02/04/2020
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
## 2021-01-21 ENCOUNTER — Other Ambulatory Visit: Payer: Self-pay | Admitting: Family Medicine

## 2021-01-21 DIAGNOSIS — E1165 Type 2 diabetes mellitus with hyperglycemia: Secondary | ICD-10-CM

## 2021-01-27 ENCOUNTER — Encounter: Payer: Self-pay | Admitting: Family Medicine

## 2021-01-27 ENCOUNTER — Other Ambulatory Visit: Payer: Self-pay | Admitting: Family Medicine

## 2021-01-27 MED ORDER — FREESTYLE LIBRE 2 READER DEVI: 1 refills | Status: DC

## 2021-01-27 MED ORDER — FREESTYLE LIBRE 2 SENSOR MISC: 1 refills | Status: DC

## 2021-02-09 ENCOUNTER — Ambulatory Visit: Payer: 59 | Admitting: Family Medicine

## 2021-02-16 ENCOUNTER — Encounter: Payer: Self-pay | Admitting: Family Medicine

## 2021-02-17 ENCOUNTER — Other Ambulatory Visit: Payer: Self-pay | Admitting: Family Medicine

## 2021-02-17 ENCOUNTER — Encounter: Payer: Self-pay | Admitting: Family Medicine

## 2021-02-17 DIAGNOSIS — E1165 Type 2 diabetes mellitus with hyperglycemia: Secondary | ICD-10-CM

## 2021-02-17 NOTE — Telephone Encounter (Signed)
Please send requested med to requested pharmacy so pt can afford it

## 2021-02-18 ENCOUNTER — Other Ambulatory Visit: Payer: Self-pay

## 2021-02-18 DIAGNOSIS — N529 Male erectile dysfunction, unspecified: Secondary | ICD-10-CM

## 2021-02-18 MED ORDER — SILDENAFIL CITRATE 50 MG PO TABS: 50.0000 mg | ORAL_TABLET | Freq: Every day | ORAL | 3 refills | Status: DC | PRN

## 2021-03-03 ENCOUNTER — Other Ambulatory Visit: Payer: Self-pay

## 2021-03-07 MED FILL — Dulaglutide Soln Auto-injector 1.5 MG/0.5ML: SUBCUTANEOUS | 28 days supply | Qty: 2 | Fill #0 | Status: AC

## 2021-03-09 ENCOUNTER — Other Ambulatory Visit: Payer: Self-pay

## 2021-03-15 ENCOUNTER — Other Ambulatory Visit: Payer: Self-pay | Admitting: Family Medicine

## 2021-03-15 DIAGNOSIS — E1165 Type 2 diabetes mellitus with hyperglycemia: Secondary | ICD-10-CM

## 2021-03-15 MED FILL — Atorvastatin Calcium Tab 40 MG (Base Equivalent): ORAL | 30 days supply | Qty: 30 | Fill #0 | Status: AC

## 2021-03-15 MED FILL — Fluticasone Propionate Nasal Susp 50 MCG/ACT: NASAL | 30 days supply | Qty: 16 | Fill #0 | Status: AC

## 2021-03-15 MED FILL — Lisinopril Tab 10 MG: ORAL | 30 days supply | Qty: 30 | Fill #0 | Status: AC

## 2021-03-15 MED FILL — Famotidine Tab 20 MG: ORAL | 90 days supply | Qty: 180 | Fill #0 | Status: AC

## 2021-03-16 ENCOUNTER — Other Ambulatory Visit: Payer: Self-pay

## 2021-03-16 MED ORDER — GLIPIZIDE 5 MG PO TABS
ORAL_TABLET | ORAL | 0 refills | Status: DC
  Filled 2021-03-16: qty 30, 30d supply, fill #0
  Filled 2021-04-28: qty 30, 30d supply, fill #1

## 2021-03-26 ENCOUNTER — Other Ambulatory Visit: Payer: Self-pay

## 2021-03-26 ENCOUNTER — Encounter
Admission: RE | Disposition: A | Discharge status: Home / Self Care | Payer: Self-pay | Source: Ambulatory Visit | Attending: Urology

## 2021-03-26 ENCOUNTER — Ambulatory Visit
Admission: RE | Admit: 2021-03-26 | Discharge: 2021-03-26 | Disposition: A | Discharge status: Home / Self Care | Payer: 59 | Source: Ambulatory Visit | Attending: Urology | Admitting: Urology

## 2021-03-26 ENCOUNTER — Encounter: Payer: Self-pay | Admitting: Urology

## 2021-03-26 DIAGNOSIS — Z79899 Other long term (current) drug therapy: Secondary | ICD-10-CM | POA: Diagnosis not present

## 2021-03-26 DIAGNOSIS — E119 Type 2 diabetes mellitus without complications: Secondary | ICD-10-CM | POA: Diagnosis not present

## 2021-03-26 DIAGNOSIS — Z7984 Long term (current) use of oral hypoglycemic drugs: Secondary | ICD-10-CM | POA: Diagnosis not present

## 2021-03-26 DIAGNOSIS — N201 Calculus of ureter: Secondary | ICD-10-CM | POA: Diagnosis present

## 2021-03-26 DIAGNOSIS — I10 Essential (primary) hypertension: Secondary | ICD-10-CM | POA: Insufficient documentation

## 2021-03-26 DIAGNOSIS — K219 Gastro-esophageal reflux disease without esophagitis: Secondary | ICD-10-CM | POA: Diagnosis not present

## 2021-03-26 HISTORY — PX: EXTRACORPOREAL SHOCK WAVE LITHOTRIPSY: SHX1557

## 2021-03-26 SURGERY — LITHOTRIPSY, ESWL
Anesthesia: Moderate Sedation | Laterality: Left

## 2021-03-26 MED ORDER — MORPHINE SULFATE (PF) 10 MG/ML IV SOLN
10.0000 mg | Freq: Once | INTRAVENOUS | Status: AC
Start: 2021-03-26 — End: 2021-03-26
  Administered 2021-03-26: 10 mg via INTRAMUSCULAR

## 2021-03-26 MED ORDER — MIDAZOLAM HCL 2 MG/2ML IJ SOLN
1.0000 mg | Freq: Once | INTRAMUSCULAR | Status: AC
  Administered 2021-03-26: 1 mg via INTRAMUSCULAR

## 2021-03-26 MED ORDER — DIPHENHYDRAMINE HCL 25 MG PO CAPS
25.0000 mg | ORAL_CAPSULE | Freq: Once | ORAL | Status: AC
  Administered 2021-03-26: 25 mg via ORAL

## 2021-03-26 MED ORDER — PROMETHAZINE HCL 25 MG/ML IJ SOLN
25.0000 mg | Freq: Once | INTRAMUSCULAR | Status: AC
  Administered 2021-03-26: 25 mg via INTRAMUSCULAR

## 2021-03-26 MED ORDER — FUROSEMIDE 10 MG/ML IJ SOLN
10.0000 mg | Freq: Once | INTRAMUSCULAR | Status: AC
  Administered 2021-03-26: 10 mg via INTRAVENOUS

## 2021-03-26 MED ORDER — ONDANSETRON 8 MG PO TBDP
8.0000 mg | ORAL_TABLET | Freq: Four times a day (QID) | ORAL | 3 refills | Status: DC | PRN
  Filled 2021-03-26: qty 10, 3d supply, fill #0

## 2021-03-26 MED ORDER — DEXTROSE-NACL 5-0.45 % IV SOLN: INTRAVENOUS | Status: DC

## 2021-03-26 MED ORDER — DOCUSATE SODIUM 100 MG PO CAPS
200.0000 mg | ORAL_CAPSULE | Freq: Two times a day (BID) | ORAL | 3 refills | Status: DC
  Filled 2021-03-26: qty 120, 30d supply, fill #0

## 2021-03-26 MED ORDER — LEVOFLOXACIN 500 MG PO TABS
500.0000 mg | ORAL_TABLET | Freq: Once | ORAL | Status: AC
  Administered 2021-03-26: 500 mg via ORAL

## 2021-03-26 MED ORDER — TAMSULOSIN HCL 0.4 MG PO CAPS
0.4000 mg | ORAL_CAPSULE | Freq: Every day | ORAL | 1 refills | Status: DC
  Filled 2021-03-26: qty 30, 30d supply, fill #0

## 2021-03-26 MED ORDER — HYDROCODONE-ACETAMINOPHEN 10-325 MG PO TABS
1.0000 | ORAL_TABLET | ORAL | 0 refills | Status: DC | PRN
  Filled 2021-03-26: qty 20, 5d supply, fill #0

## 2021-03-26 MED ORDER — CIPROFLOXACIN HCL 500 MG PO TABS
500.0000 mg | ORAL_TABLET | Freq: Two times a day (BID) | ORAL | 0 refills | Status: DC
  Filled 2021-03-26: qty 14, 7d supply, fill #0

## 2021-03-26 NOTE — Discharge Instructions (Addendum)
Lithotripsy, Care After This sheet gives you information about how to care for yourself after your procedure. Your health care provider may also give you more specific instructions. If you have problems or questions, contact your health care provider. What can I expect after the procedure? After the procedure, it is common to have:  Some blood in your urine. This should only last for a few days.  Soreness in your back, sides, or upper abdomen for a few days.  Blotches or bruises on the area where the shock wave entered the skin.  Pain, discomfort, or nausea when pieces (fragments) of the kidney stone move through the tube that carries urine from the kidney to the bladder (ureter). Stone fragments may pass soon after the procedure, but they may continue to pass for up to 4-8 weeks. ? If you have severe pain or nausea, contact your health care provider. This may be caused by a large stone that was not broken up, and this may mean that you need more treatment.  Some pain or discomfort during urination.  Some pain or discomfort in the lower abdomen or (in men) at the base of the penis. Follow these instructions at home: Medicines  Take over-the-counter and prescription medicines only as told by your health care provider.  If you were prescribed an antibiotic medicine, take it as told by your health care provider. Do not stop taking the antibiotic even if you start to feel better.  Ask your health care provider if the medicine prescribed to you requires you to avoid driving or using machinery. Eating and drinking  Drink enough fluid to keep your urine pale yellow. This helps any remaining pieces of the stone to pass. It can also help prevent new stones from forming.  Eat plenty of fresh fruits and vegetables.  Follow instructions from your health care provider about eating or drinking restrictions. You may be instructed to: ? Reduce how much salt (sodium) you eat or drink. Check  ingredients and nutrition facts on packaged foods and beverages to see how much sodium they contain. ? Reduce how much meat you eat.  Eat the recommended amount of calcium for your age and gender. Ask your health care provider how much calcium you should have.      General instructions  Get plenty of rest.  Return to your normal activities as told by your health care provider. Ask your health care provider what activities are safe for you. Most people can resume normal activities 1-2 days after the procedure.  If you were given a sedative during the procedure, it can affect you for several hours. Do not drive or operate machinery until your health care provider says that it is safe.  Your health care provider may direct you to lie in a certain position (postural drainage) and tap firmly (percuss) over your kidney area to help stone fragments pass. Follow instructions as told by your health care provider.  If directed, strain all urine through the strainer that was provided by your health care provider. ? Keep all fragments for your health care provider to see. Any stones that are found may be sent to a medical lab for examination. The stone may be as small as a grain of salt.  Keep all follow-up visits as told by your health care provider. This is important. Contact a health care provider if:  You have a fever or chills.  You have nausea that is severe or does not go away.  You have  any of these urinary symptoms: ? Blood in your urine for longer than your health care provider told you to expect. ? Urine that smells bad or unusual. ? Feeling a strong urge to urinate after emptying your bladder. ? Pain or burning with urination that does not go away. ? Urinating more often than usual and this does not go away.  You have a stent and it comes out. Get help right away if:  You have severe pain in your back, sides, or upper abdomen.  You have any of these urinary symptoms: ? Severe  pain while urinating. ? More blood in your urine or having blood in your urine when you did not before. ? Passing blood clots in your urine. ? Passing only a small amount of urine or being unable to pass any urine at all.  You have severe nausea that leads to persistent vomiting.  You faint. Summary  After this procedure, it is common to have some pain, discomfort, or nausea when pieces (fragments) of the kidney stone move through the tube that carries urine from the kidney to the bladder (ureter). If this pain or nausea is severe, however, you should contact your health care provider.  Return to your normal activities as told by your health care provider. Ask your health care provider what activities are safe for you.  Drink enough fluid to keep your urine pale yellow. This helps any remaining pieces of the stone to pass, and it can help prevent new stones from forming.  If directed, strain your urine and keep all fragments for your health care provider to see. Fragments or stones may be as small as a grain of salt.  Get help right away if you have severe pain in your back, sides, or upper abdomen, or if you have severe pain while urinating. This information is not intended to replace advice given to you by your health care provider. Make sure you discuss any questions you have with your health care provider. Document Revised: 08/29/2019 Document Reviewed: 08/29/2019 Elsevier Patient Education  2021 [employer]  AMBULATORY SURGERY  DISCHARGE INSTRUCTIONS   1) The drugs that you were given will stay in your system until tomorrow so for the next 24 hours you should not:  A) Drive an automobile B) Make any legal decisions C) Drink any alcoholic beverage   2) You may resume regular meals tomorrow.  Today it is better to start with liquids and gradually work up to solid foods.  You may eat anything you prefer, but it is better to start with liquids, then soup and crackers, and  gradually work up to solid foods.   3) Please notify your doctor immediately if you have any unusual bleeding, trouble breathing, redness and pain at the surgery site, drainage, fever, or pain not relieved by medication.    4) Additional Instructions:   Please contact your physician with any problems or Same Day Surgery at [phone], Monday through Friday 6 am to 4 pm, or [place] [facility] [place] [facility] number at [phone].  

## 2021-03-27 ENCOUNTER — Encounter: Payer: Self-pay | Admitting: Certified Registered Nurse Anesthetist

## 2021-03-27 ENCOUNTER — Encounter: Payer: Self-pay | Admitting: Urology

## 2021-03-27 LAB — GLUCOSE, CAPILLARY: Glucose-Capillary: 71 mg/dL (ref 70–99)

## 2021-04-06 MED FILL — Dulaglutide Soln Auto-injector 1.5 MG/0.5ML: SUBCUTANEOUS | 28 days supply | Qty: 2 | Fill #1 | Status: AC

## 2021-04-07 ENCOUNTER — Other Ambulatory Visit: Payer: Self-pay

## 2021-04-28 MED FILL — Atorvastatin Calcium Tab 40 MG (Base Equivalent): ORAL | 30 days supply | Qty: 30 | Fill #1 | Status: AC

## 2021-04-28 MED FILL — Lisinopril Tab 10 MG: ORAL | 30 days supply | Qty: 30 | Fill #1 | Status: AC

## 2021-04-29 ENCOUNTER — Other Ambulatory Visit: Payer: Self-pay

## 2021-05-06 MED FILL — Dulaglutide Soln Auto-injector 1.5 MG/0.5ML: SUBCUTANEOUS | 28 days supply | Qty: 2 | Fill #2 | Status: AC

## 2021-05-07 ENCOUNTER — Other Ambulatory Visit: Payer: Self-pay

## 2021-05-12 ENCOUNTER — Other Ambulatory Visit: Payer: Self-pay

## 2021-05-12 ENCOUNTER — Ambulatory Visit: Payer: 59 | Admitting: Family Medicine

## 2021-05-12 ENCOUNTER — Encounter: Payer: Self-pay | Admitting: Family Medicine

## 2021-05-12 VITALS — BP 118/74 | HR 91 | Temp 98.4°F | Resp 16 | Ht 69.0 in | Wt 244.6 lb

## 2021-05-12 DIAGNOSIS — I10 Essential (primary) hypertension: Secondary | ICD-10-CM | POA: Diagnosis not present

## 2021-05-12 DIAGNOSIS — E785 Hyperlipidemia, unspecified: Secondary | ICD-10-CM

## 2021-05-12 DIAGNOSIS — E1169 Type 2 diabetes mellitus with other specified complication: Secondary | ICD-10-CM | POA: Diagnosis not present

## 2021-05-12 DIAGNOSIS — Z5181 Encounter for therapeutic drug level monitoring: Secondary | ICD-10-CM

## 2021-05-12 DIAGNOSIS — N529 Male erectile dysfunction, unspecified: Secondary | ICD-10-CM

## 2021-05-12 DIAGNOSIS — Z6835 Body mass index (BMI) 35.0-35.9, adult: Secondary | ICD-10-CM

## 2021-05-12 DIAGNOSIS — K219 Gastro-esophageal reflux disease without esophagitis: Secondary | ICD-10-CM

## 2021-05-12 MED ORDER — FAMOTIDINE 20 MG PO TABS
ORAL_TABLET | ORAL | 3 refills | Status: DC
  Filled 2021-05-12: qty 180, 90d supply, fill #0
  Filled 2021-05-28 – 2022-04-14 (×2): qty 60, 30d supply, fill #0

## 2021-05-12 NOTE — Patient Instructions (Signed)
We will need a 3-4 month follow up if A1C is still elevated  Type 2 Diabetes Mellitus, Self-Care, Adult When you have type 2 diabetes (type 2 diabetes mellitus), you must make sure your blood sugar (glucose) stays in a healthy range. You can do this with: Nutrition. Exercise. Lifestyle changes. Medicines or insulin, if needed. Support from your doctors and others. What are the risks? Having diabetes can raise your risk for other long-term (chronic) health problems. You may get medicines to help prevent these problems. How to stay aware of blood sugar  Check your blood sugar level every day, as often as told. Have your A1C (hemoglobin A1C) level checked two or more times a year. Have it checked more often if told. Your doctor will set personal treatment goals for you. In general, you should have these blood sugar levels: Before meals: 80-130 mg/dL (4.4-7.2 mmol/L). After meals: below 180 mg/dL (10 mmol/L). A1C: less than 7%. How to manage high and low blood sugar Symptoms of high blood sugar High blood sugar is also called hyperglycemia. Know the symptoms of high blood sugar. These may include: More thirst. Hunger. Feeling very tired. Needing to pee (urinate) more often than normal. Seeing things blurry. Symptoms of low blood sugar Low blood sugar is also called hypoglycemia. This is when blood sugar is at or below 70 mg/dL (3.9 mmol/L). Symptoms may include: Hunger. Feeling worried or nervous (anxious). Feeling sweaty and cold to the touch (clammy). Being dizzy or light-headed. Feeling sleepy. A fast heartbeat. Feeling grouchy (irritable). Tingling or loss of feeling (numbness) around your mouth, lips, or tongue. Restless sleep. Diabetes medicines can cause low blood sugar. You are more at risk: While you exercise. After exercise. During sleep. When you are sick. When you skip meals or do not eat for a long time. Treating low blood sugar If you think you have low blood  sugar, eat or drink something sugary right away. Keep 15 grams of a fast-acting carb (carbohydrate) with you all the time. Make sure your family and friends know how to treatyou if you cannot treat yourself. Treating very low blood sugar Severe hypoglycemia is when your blood sugar is at or below 54 mg/dL (3 mmol/L). Severe hypoglycemia is an emergency. Do not wait to see if the symptoms will go away. Get medical help right away. Call your local emergency services (911 in the [place] Do not drive yourself to the hospital. You may need a glucagon shot if you have very low blood sugar and you cannot eat or drink. Have a family member or friend learn how to check your blood sugar and how to give you a glucagon shot. Ask your doctor if you should have akit for glucagon shots. Follow these instructions at home: Medicines Take diabetes medicines as told. If your doctor prescribed insulin or diabetes medicines, take them each day. Do not run out of insulin or other medicines. Plan ahead. If you use insulin, change the amount you take based on how active you are and what foods you eat. Your doctor will tell you how to do this. Take over-the-counter and prescription medicines only as told by your doctor. Eating and drinking  Eat healthy foods. These include: Low-fat (lean) proteins. Complex carbs, such as whole grains. Fresh fruits and vegetables. Low-fat dairy products. Healthy fats. Meet with a food expert (dietitian) to make an eating plan. Follow instructions from your doctor about what you cannot eat or drink. Drink enough fluid to keep your pee (urine)  pale yellow. Keep track of carbs that you eat. Read food labels and learn serving sizes of foods. Follow your sick-day plan when you cannot eat or drink as normal. Make this plan with your doctor so it is ready to use.  Activity Exercise as told by your doctor. You may need to: Do stretching and strength exercises 2 or more times a week. Do 150  minutes or more of exercise each week that makes your heart beat faster and makes you sweat. Spread out your exercise over 3 or more days a week. Do not go more than 2 days in a row without exercise. Talk with your doctor before you start a new exercise. Your doctor may tell you to change: How much insulin or medicines you take. How much food you eat. Lifestyle Do not use any products that contain nicotine or tobacco, such as cigarettes, e-cigarettes, and chewing tobacco. If you need help quitting, ask your doctor. If you drink alcohol and your doctor says alcohol is safe for you: Limit how much you use to: 0-1 drink a day for women who are not pregnant. 0-2 drinks a day for men. Be aware of how much alcohol is in your drink. In the [place] one drink equals one 12 oz bottle of beer (355 mL), one 5 oz glass of wine (148 mL), or one 1 oz glass of hard liquor (44 mL). Learn to deal with stress. If you need help, ask your doctor. Body care  Stay up to date with your shots (immunizations). Have your eyes and feet checked by a doctor as often as told. Check your skin and feet every day. Check for cuts, bruises, redness, blisters, or sores. Brush your teeth and gums two times a day. Floss one or more times a day. Go to the dentist one or more times every 6 months. Stay at a healthy weight.  General instructions Share your diabetes care plan with: Your work or school. People you live with. Carry a card or wear jewelry that says you have diabetes. Keep all follow-up visits as told by your doctor. This is important. Questions to ask your doctor Do I need to meet with a certified expert in diabetes education and care? Where can I find a support group? Where to find more information American Diabetes Association: www.diabetes.org American Association of Diabetes Care and Education Specialists: www.diabeteseducator.org International Diabetes Federation: MemberVerification.ca Summary When you have type 2  diabetes, you must make sure your blood sugar (glucose) stays in a healthy range. You can do this with nutrition, exercise, medicines and insulin, and support from doctors and others. Check your blood sugar every day, or as often as told. Having diabetes can raise your risk for other long-term health problems. You may get medicines to help prevent these problems. Share your diabetes management plan with people at work, school, and home. Keep all follow-up visits as told by your doctor. This is important. This information is not intended to replace advice given to you by your health care provider. Make sure you discuss any questions you have with your healthcare provider. Document Revised: 10/29/2020 Document Reviewed: 06/18/2020 Elsevier Patient Education  [place].

## 2021-05-12 NOTE — Progress Notes (Signed)
Name: Samaad Hashem   MRN: [id number]    DOB: [birth year]   Date:05/12/2021       Progress Note  Chief Complaint  Patient presents with   Diabetes   Hyperlipidemia   Hypertension   Gastroesophageal Reflux     Subjective:   Vale Mousseau is a 45 y.o. male, presents to clinic for routine f/up  DM:   Pt managing DM with glipizide 5 mg and Trulicity 1.5 mg weekly Reports good med compliance Pt has no SE from meds. Blood sugars -not currently checking she did have freestyle libre 2 meter - samples worked well and CBG improved - he got Rx from pharmacy and they would not work x 2 Denies: Polyuria, polydipsia, vision changes, neuropathy, hypoglycemia Recent pertinent labs: Lab Results  Component Value Date   HGBA1C 7.9 (H) 12/04/2020   HGBA1C 9.2 (H) 08/11/2020   HGBA1C 7.8 (H) 02/04/2020   Standard of care and health maintenance: Foot exam:  due today DM eye exam:  utd ACEI/ARB:  yes - lisinopril Statin:  lipitor  Hyperlipidemia: Currently treated with Lipitor 40 mg daily, pt reports good med compliance Last Lipids: Lab Results  Component Value Date   CHOL 130 02/04/2020   HDL 44 02/04/2020   LDLCALC 70 02/04/2020   TRIG 82 02/04/2020   CHOLHDL 3.0 02/04/2020   - Denies: Chest pain, shortness of breath, myalgias, claudication  Hypertension:  Currently managed on lisinopril 10  Pt reports good med compliance and denies any SE.   Blood pressure today is well controlled. BP Readings from Last 3 Encounters:  05/12/21 118/74  03/26/21 137/84  01/08/21 118/70   Pt denies CP, SOB, exertional sx, LE edema, palpitation, Ha's, visual disturbances, lightheadedness, hypotension, syncope.  Recent kidney stone saw urology -he had lithotripsy and was covered with ciprofloxacin antibiotics after procedure symptoms have improved and resolved no current flank pain, dysuria, hematuria or difficulty with urination  ED - Sildenafil 50 mg works for erectile  dysfunction, he gets from a different pharmacy due to price and limited number of pills dispensed, no prolonged erection or adverse side effects when taking medications for sexual activity  No longer using Bentyl for abdominal symptoms denies any fullness, regurgitation, dyspepsia, nausea, bloating    Current Outpatient Medications:    atorvastatin (LIPITOR) 40 MG tablet, TAKE 1 TABLET BY MOUTH AT BEDTIME, Disp: 90 tablet, Rfl: 3   blood glucose meter kit and supplies KIT, Dispense based on patient and insurance preference. Use once daily for fasting blood sugar monitoring for Dx T2DM uncontrolled, ICD 10-E11.65, Disp: 1 each, Rfl: 0   ciprofloxacin (CIPRO) 500 MG tablet, Take 1 tablet (500 mg total) by mouth 2 (two) times daily., Disp: 14 tablet, Rfl: 0   Continuous Blood Gluc Receiver (FREESTYLE LIBRE 2 READER) DEVI, USE EVERY 2 WEEKS, Disp: 2 each, Rfl: 1   Continuous Blood Gluc Sensor (FREESTYLE LIBRE 2 SENSOR) MISC, USE EVERY 2 WEEKS, Disp: 2 each, Rfl: 1   COVID-19 mRNA vaccine, Pfizer, 30 MCG/0.3ML injection, USE AS DIRECTED, Disp: .3 mL, Rfl: 0   dicyclomine (BENTYL) 20 MG tablet, Take 1 tablet (20 mg total) by mouth 3 (three) times daily as needed for spasms (abd cramping)., Disp: 60 tablet, Rfl: 1   Dulaglutide 1.5 MG/0.5ML SOPN, INJECT 1.5 MG INTO THE SKIN ONCE A WEEK., Disp: 6 mL, Rfl: 3   famotidine (PEPCID) 20 MG tablet, TAKE 1 TABLET BY MOUTH 2 TIMES DAILY AS NEEDED FOR HEARTBURN OR INDIGESTION., Disp:  180 tablet, Rfl: 3   fluticasone (FLONASE) 50 MCG/ACT nasal spray, USE 2 SPRAYS INTO EACH NOSTRIL ONCE DAILY, Disp: 16 g, Rfl: 3   glipiZIDE (GLUCOTROL) 5 MG tablet, TAKE 1 TABLET BY MOUTH DAILY BEFORE BREAKFAST. HOLD IF FASTING BLOOD SUGAR LESS THAN 100 OR IF NOT EATING BREAKFAST, Disp: 90 tablet, Rfl: 0   lisinopril (ZESTRIL) 10 MG tablet, TAKE 1 TABLET (10 MG TOTAL) BY MOUTH DAILY., Disp: 90 tablet, Rfl: 3   sildenafil (REVATIO) 20 MG tablet, Take by mouth., Disp: , Rfl:     sildenafil (VIAGRA) 50 MG tablet, Take 1-2 tablets (50-100 mg total) by mouth daily as needed for erectile dysfunction (30 mins prior to sexual activity)., Disp: 30 tablet, Rfl: 3  Patient Active Problem List   Diagnosis Date Noted   Uncontrolled type 2 diabetes mellitus with hyperglycemia ([place]) 08/13/2020   Special screening for malignant neoplasms, colon    Polyp of descending colon    Bilateral calf pain 02/04/2020   Partial small bowel obstruction ([place]) 01/25/2020   Class 2 severe obesity with serious comorbidity and body mass index (BMI) of 35.0 to 35.9 in adult ([place]) 09/08/2017   Hearing loss 03/11/2017   Hyperlipidemia LDL goal <70 07/19/2016   Medication monitoring encounter 07/19/2016   Type 2 diabetes mellitus ([place]) 06/23/2016   Allergic rhinitis with postnasal drip 12/22/2015   Obstructive sleep apnea treated with continuous positive airway pressure (CPAP) 06/16/2015   Hypertension goal BP (blood pressure) < 140/90 06/16/2015   GERD without esophagitis 06/16/2015   Stiffness of right shoulder joint 06/16/2015   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   Low libido 08/22/2013   Right upper quadrant pain 02/19/2013    Past Surgical History:  Procedure Laterality Date   COLONOSCOPY WITH PROPOFOL N/A 04/21/2020   Procedure: COLONOSCOPY WITH PROPOFOL;  Surgeon: Lucilla Lame, MD;  Location: [facility];  Service: Endoscopy;  Laterality: N/A;   CYSTOSCOPY W/ RETROGRADES Left 09/16/2015   Procedure: CYSTOSCOPY WITH RETROGRADE PYELOGRAM;  Surgeon: Royston Cowper, MD;  Location: [facility];  Service: Urology;  Laterality: Left;   CYSTOSCOPY WITH URETEROSCOPY AND STENT PLACEMENT     EXTRACORPOREAL SHOCK WAVE LITHOTRIPSY Left 03/26/2021   Procedure: EXTRACORPOREAL SHOCK WAVE LITHOTRIPSY (ESWL);  Surgeon: Royston Cowper, MD;  Location: [facility];  Service: Urology;  Laterality: Left;   POLYPECTOMY  04/21/2020   Procedure: POLYPECTOMY;  Surgeon: Lucilla Lame, MD;   Location: [facility];  Service: Endoscopy;;   URETEROSCOPY WITH HOLMIUM LASER LITHOTRIPSY Left 09/16/2015   Procedure: URETEROSCOPY WITH HOLMIUM LASER LITHOTRIPSY;  Surgeon: Royston Cowper, MD;  Location: [facility];  Service: Urology;  Laterality: Left;   VASECTOMY      Family History  Problem Relation Age of Onset   Diabetes Mother    Cancer Father        renal   Spina bifida Sister    Appendicitis Son    Cancer Maternal Grandfather        black lung   Cancer Paternal Grandmother        lung   Colon cancer Paternal Grandmother    Diabetes Sister     Social History   Tobacco Use   Smoking status: Never   Smokeless tobacco: Never  Vaping Use   Vaping Use: Never used  Substance Use Topics   Alcohol use: Yes    Alcohol/week: 0.0 - 1.0 standard drinks    Comment: rarley   Drug use: No  Allergies  Allergen Reactions   Omeprazole Other (See Comments)    Reaction:  Memory loss     Health Maintenance  Topic Date Due   OPHTHALMOLOGY EXAM  08/30/2019   FOOT EXAM  02/03/2021   HEMOGLOBIN A1C  06/03/2021   INFLUENZA VACCINE  06/29/2021   Zoster Vaccines- Shingrix (1 of 2) 08/31/2024   COLONOSCOPY (Pts 45-18yr Insurance coverage will need to be confirmed)  04/21/2025   TETANUS/TDAP  06/03/2029   PNEUMOCOCCAL POLYSACCHARIDE VACCINE AGE 90-64 HIGH RISK  Completed   COVID-19 Vaccine  Completed   Hepatitis C Screening  Completed   HIV Screening  Completed   Pneumococcal Vaccine [id number] old  Aged Out   HPV VACCINES  Aged Out    Chart Review Today: I personally reviewed active problem list, medication list, allergies, family history, social history, health maintenance, notes from last encounter, lab results, imaging with the patient/caregiver today.   Review of Systems  Constitutional: Negative.   HENT: Negative.    Eyes: Negative.   Respiratory: Negative.    Cardiovascular: Negative.   Gastrointestinal: Negative.   Endocrine: Negative.    Genitourinary: Negative.   Musculoskeletal: Negative.   Skin: Negative.   Allergic/Immunologic: Negative.   Neurological: Negative.   Hematological: Negative.   Psychiatric/Behavioral: Negative.    All other systems reviewed and are negative.   Objective:   Vitals:   05/12/21 0850  BP: 118/74  Pulse: 91  Resp: 16  Temp: 98.4 F (36.9 C)  SpO2: 97%  Weight: 244 lb 9.6 oz (110.9 kg)  Height: 5' 9"  (1.753 m)    Body mass index is 36.12 kg/m.  Physical Exam Vitals and nursing note reviewed.  Constitutional:      General: He is not in acute distress.    Appearance: Normal appearance. He is well-developed. He is not ill-appearing, toxic-appearing or diaphoretic.     Interventions: Face mask in place.  HENT:     Head: Normocephalic and atraumatic.     Jaw: No trismus.     Right Ear: External ear normal.     Left Ear: External ear normal.  Eyes:     General: Lids are normal. No scleral icterus.       Right eye: No discharge.        Left eye: No discharge.     Conjunctiva/sclera: Conjunctivae normal.  Neck:     Trachea: Trachea and phonation normal. No tracheal deviation.  Cardiovascular:     Rate and Rhythm: Normal rate and regular rhythm.     Pulses: Normal pulses.          Radial pulses are 2+ on the right side and 2+ on the left side.       Posterior tibial pulses are 2+ on the right side and 2+ on the left side.     Heart sounds: Normal heart sounds. No murmur heard.   No friction rub. No gallop.  Pulmonary:     Effort: Pulmonary effort is normal. No respiratory distress.     Breath sounds: Normal breath sounds. No stridor. No wheezing, rhonchi or rales.  Abdominal:     General: Bowel sounds are normal. There is no distension.     Palpations: Abdomen is soft.  Musculoskeletal:     Right lower leg: No edema.     Left lower leg: No edema.  Skin:    General: Skin is warm and dry.     Coloration: Skin is not jaundiced.  Findings: No rash.     Nails: There  is no clubbing.  Neurological:     Mental Status: He is alert. Mental status is at baseline.     Cranial Nerves: No dysarthria or facial asymmetry.     Motor: No tremor or abnormal muscle tone.     Gait: Gait normal.  Psychiatric:        Mood and Affect: Mood normal.        Speech: Speech normal.        Behavior: Behavior normal. Behavior is cooperative.     Diabetic Foot Exam - Simple   Simple Foot Form Diabetic Foot exam was performed with the following findings: Yes 05/12/2021  9:30 AM  Visual Inspection No deformities, no ulcerations, no other skin breakdown bilaterally: Yes Sensation Testing Intact to touch and monofilament testing bilaterally: Yes Pulse Check Posterior Tibialis and Dorsalis pulse intact bilaterally: Yes Comments    Health Maintenance  Topic Date Due   Flu Shot  06/29/2021   Eye exam for diabetics  09/25/2021   Hemoglobin A1C  11/11/2021   Complete foot exam   05/12/2022   Colon Cancer Screening  04/21/2025   Tetanus Vaccine  06/03/2029   Pneumococcal vaccine  Completed   COVID-19 Vaccine  Completed   Hepatitis C Screening: USPSTF Recommendation to screen - Ages 93-79 yo.  Completed   HIV Screening  Completed   Pneumococcal Vaccination  Aged Out   HPV Vaccine  Aged Out      Assessment & Plan:     ICD-10-CM   1. Type 2 diabetes mellitus with other specified complication, without long-term current use of insulin (HCC)  L24.40 COMPLETE METABOLIC PANEL WITH GFR    Hemoglobin A1C    Ambulatory referral to Ophthalmology   Has been uncontrolled, continued on glipizide and Trulicity injections, he improved blood sugars when on libre glucose meter, recheck labs and adjust meds    2. Hypertension, unspecified type  N02 COMPLETE METABOLIC PANEL WITH GFR   Blood pressure stable, very well controlled and at goal today -prefer goal to be less than 130/80 continue lisinopril and DASH    3. Hyperlipidemia LDL goal <70  E78.5 Lipid panel    COMPLETE METABOLIC  PANEL WITH GFR   Good statin compliance, no myalgias side effects or concerns    4. Class 2 severe obesity with serious comorbidity and body mass index (BMI) of 35.0 to 35.9 in adult, unspecified obesity type (HCC)  E66.01 Lipid panel   V25.36 COMPLETE METABOLIC PANEL WITH GFR    Hemoglobin A1C   Weight stable, encouraged healthy diet, increased exercise and healthy lifestyle    5. Gastroesophageal reflux disease, unspecified whether esophagitis present  K21.9 famotidine (PEPCID) 20 MG tablet   GI symptoms are currently well controlled, can use Pepcid twice daily as needed, can also restart PPI for 2+ weeks as needed if symptoms recur    6. ED (erectile dysfunction) of organic origin  N52.9    refill on meds/dose change     7. Encounter for medication monitoring  Z51.81 Lipid panel    COMPLETE METABOLIC PANEL WITH GFR    Hemoglobin A1C    CBC with Differential/Platelet       Return in about 6 months (around 11/11/2021) for Routine follow-up DM .   Delsa Grana, PA-C 05/12/21 9:28 AM

## 2021-05-13 ENCOUNTER — Other Ambulatory Visit: Payer: Self-pay | Admitting: Family Medicine

## 2021-05-13 ENCOUNTER — Other Ambulatory Visit: Payer: Self-pay

## 2021-05-13 DIAGNOSIS — E1165 Type 2 diabetes mellitus with hyperglycemia: Secondary | ICD-10-CM

## 2021-05-13 LAB — LIPID PANEL
Cholesterol: 122 mg/dL (ref <200)
HDL: 39 mg/dL — ABNORMAL LOW (ref >=40)
LDL Cholesterol (Calc): 60 mg/dL (calc)
Non-HDL Cholesterol (Calc): 83 mg/dL (calc) (ref <130)
Total CHOL/HDL Ratio: 3.1 (calc) (ref <5.0)
Triglycerides: 145 mg/dL (ref <150)

## 2021-05-13 LAB — COMPLETE METABOLIC PANEL WITH GFR
AG Ratio: 2 (calc) (ref 1.0–2.5)
ALT: 30 U/L (ref 9–46)
AST: 17 U/L (ref 10–40)
Albumin: 4.4 g/dL (ref 3.6–5.1)
Alkaline phosphatase (APISO): 66 U/L (ref 36–130)
BUN: 16 mg/dL (ref 7–25)
CO2: 28 mmol/L (ref 20–32)
Calcium: 9.5 mg/dL (ref 8.6–10.3)
Chloride: 104 mmol/L (ref 98–110)
Creat: 1.03 mg/dL (ref 0.60–1.35)
GFR, Est African American: 100 mL/min/{1.73_m2} (ref >=60)
GFR, Est Non African American: 87 mL/min/{1.73_m2} (ref >=60)
Globulin: 2.2 g/dL (calc) (ref 1.9–3.7)
Glucose, Bld: 150 mg/dL — ABNORMAL HIGH (ref 65–99)
Potassium: 4.6 mmol/L (ref 3.5–5.3)
Sodium: 140 mmol/L (ref 135–146)
Total Bilirubin: 0.4 mg/dL (ref 0.2–1.2)
Total Protein: 6.6 g/dL (ref 6.1–8.1)

## 2021-05-13 LAB — CBC WITH DIFFERENTIAL/PLATELET
Absolute Monocytes: 662 cells/uL (ref 200–950)
Basophils Absolute: 103 cells/uL (ref 0–200)
Basophils Relative: 1.2 %
Eosinophils Absolute: 206 cells/uL (ref 15–500)
Eosinophils Relative: 2.4 %
HCT: 42.9 % (ref 38.5–50.0)
Hemoglobin: 14.5 g/dL (ref 13.2–17.1)
Lymphs Abs: 2167 cells/uL (ref 850–3900)
MCH: 29.6 pg (ref 27.0–33.0)
MCHC: 33.8 g/dL (ref 32.0–36.0)
MCV: 87.6 fL (ref 80.0–100.0)
MPV: 9.8 fL (ref 7.5–12.5)
Monocytes Relative: 7.7 %
Neutro Abs: 5461 cells/uL (ref 1500–7800)
Neutrophils Relative %: 63.5 %
Platelets: 278 10*3/uL (ref 140–400)
RBC: 4.9 10*6/uL (ref 4.20–5.80)
RDW: 12.6 % (ref 11.0–15.0)
Total Lymphocyte: 25.2 %
WBC: 8.6 10*3/uL (ref 3.8–10.8)

## 2021-05-13 LAB — HEMOGLOBIN A1C
Hgb A1c MFr Bld: 7 % of total Hgb — ABNORMAL HIGH (ref <5.7)
Mean Plasma Glucose: 154 mg/dL
eAG (mmol/L): 8.5 mmol/L

## 2021-05-13 MED ORDER — GLIPIZIDE 5 MG PO TABS
ORAL_TABLET | ORAL | 3 refills | Status: DC
  Filled 2021-05-13: qty 31, 31d supply, fill #0

## 2021-05-21 ENCOUNTER — Encounter: Payer: Self-pay | Admitting: Family Medicine

## 2021-05-28 MED FILL — Atorvastatin Calcium Tab 40 MG (Base Equivalent): ORAL | 30 days supply | Qty: 30 | Fill #2 | Status: AC

## 2021-05-28 MED FILL — Fluticasone Propionate Nasal Susp 50 MCG/ACT: NASAL | 30 days supply | Qty: 16 | Fill #1 | Status: AC

## 2021-05-28 MED FILL — Lisinopril Tab 10 MG: ORAL | 30 days supply | Qty: 30 | Fill #2 | Status: AC

## 2021-05-29 ENCOUNTER — Other Ambulatory Visit: Payer: Self-pay

## 2021-06-02 ENCOUNTER — Other Ambulatory Visit: Payer: Self-pay

## 2021-06-05 ENCOUNTER — Other Ambulatory Visit: Payer: Self-pay

## 2021-06-05 MED FILL — Dulaglutide Soln Auto-injector 1.5 MG/0.5ML: SUBCUTANEOUS | 28 days supply | Qty: 2 | Fill #3 | Status: AC

## 2021-06-19 ENCOUNTER — Other Ambulatory Visit: Payer: Self-pay

## 2021-06-19 MED FILL — Continuous Glucose System Sensor: 28 days supply | Qty: 2 | Fill #0 | Status: AC

## 2021-06-25 ENCOUNTER — Other Ambulatory Visit: Payer: Self-pay

## 2021-06-25 MED FILL — Lisinopril Tab 10 MG: ORAL | 30 days supply | Qty: 30 | Fill #3 | Status: AC

## 2021-07-10 MED FILL — Dulaglutide Soln Auto-injector 1.5 MG/0.5ML: SUBCUTANEOUS | 28 days supply | Qty: 2 | Fill #4 | Status: AC

## 2021-07-12 MED FILL — Atorvastatin Calcium Tab 40 MG (Base Equivalent): ORAL | 30 days supply | Qty: 30 | Fill #3 | Status: AC

## 2021-07-13 ENCOUNTER — Other Ambulatory Visit: Payer: Self-pay

## 2021-07-30 MED FILL — Lisinopril Tab 10 MG: ORAL | 30 days supply | Qty: 30 | Fill #4 | Status: AC

## 2021-07-31 ENCOUNTER — Other Ambulatory Visit: Payer: Self-pay

## 2021-08-07 MED FILL — Dulaglutide Soln Auto-injector 1.5 MG/0.5ML: SUBCUTANEOUS | 28 days supply | Qty: 2 | Fill #5 | Status: AC

## 2021-08-10 ENCOUNTER — Other Ambulatory Visit: Payer: Self-pay

## 2021-08-16 MED FILL — Atorvastatin Calcium Tab 40 MG (Base Equivalent): ORAL | 30 days supply | Qty: 30 | Fill #4 | Status: CN

## 2021-08-17 ENCOUNTER — Other Ambulatory Visit: Payer: Self-pay

## 2021-08-17 MED FILL — Atorvastatin Calcium Tab 40 MG (Base Equivalent): ORAL | 30 days supply | Qty: 30 | Fill #4 | Status: AC

## 2021-08-30 MED FILL — Lisinopril Tab 10 MG: ORAL | 30 days supply | Qty: 30 | Fill #5 | Status: CN

## 2021-08-31 ENCOUNTER — Other Ambulatory Visit: Payer: Self-pay

## 2021-08-31 MED FILL — Lisinopril Tab 10 MG: ORAL | 30 days supply | Qty: 30 | Fill #5 | Status: AC

## 2021-09-14 ENCOUNTER — Other Ambulatory Visit: Payer: Self-pay

## 2021-09-14 MED FILL — Dulaglutide Soln Auto-injector 1.5 MG/0.5ML: SUBCUTANEOUS | 28 days supply | Qty: 2 | Fill #6 | Status: AC

## 2021-09-17 MED FILL — Atorvastatin Calcium Tab 40 MG (Base Equivalent): ORAL | 30 days supply | Qty: 30 | Fill #5 | Status: AC

## 2021-09-18 ENCOUNTER — Other Ambulatory Visit: Payer: Self-pay

## 2021-09-18 ENCOUNTER — Encounter: Payer: Self-pay | Admitting: Family Medicine

## 2021-09-18 ENCOUNTER — Emergency Department: Payer: 59

## 2021-09-18 ENCOUNTER — Emergency Department
Admission: EM | Admit: 2021-09-18 | Discharge: 2021-09-18 | Disposition: A | Discharge status: Home / Self Care | Payer: 59 | Attending: Emergency Medicine | Admitting: Emergency Medicine

## 2021-09-18 DIAGNOSIS — E1122 Type 2 diabetes mellitus with diabetic chronic kidney disease: Secondary | ICD-10-CM | POA: Diagnosis not present

## 2021-09-18 DIAGNOSIS — Z79899 Other long term (current) drug therapy: Secondary | ICD-10-CM | POA: Diagnosis not present

## 2021-09-18 DIAGNOSIS — I129 Hypertensive chronic kidney disease with stage 1 through stage 4 chronic kidney disease, or unspecified chronic kidney disease: Secondary | ICD-10-CM | POA: Diagnosis not present

## 2021-09-18 DIAGNOSIS — N189 Chronic kidney disease, unspecified: Secondary | ICD-10-CM | POA: Insufficient documentation

## 2021-09-18 DIAGNOSIS — R0789 Other chest pain: Secondary | ICD-10-CM | POA: Insufficient documentation

## 2021-09-18 DIAGNOSIS — Z7984 Long term (current) use of oral hypoglycemic drugs: Secondary | ICD-10-CM | POA: Insufficient documentation

## 2021-09-18 LAB — BASIC METABOLIC PANEL
Anion gap: 10 (ref 5–15)
BUN: 12 mg/dL (ref 6–20)
CO2: 24 mmol/L (ref 22–32)
Calcium: 8.9 mg/dL (ref 8.9–10.3)
Chloride: 103 mmol/L (ref 98–111)
Creatinine, Ser: 0.95 mg/dL (ref 0.61–1.24)
GFR, Estimated: >60 mL/min (ref >60)
Glucose, Bld: 202 mg/dL — ABNORMAL HIGH (ref 70–99)
Potassium: 4.1 mmol/L (ref 3.5–5.1)
Sodium: 137 mmol/L (ref 135–145)

## 2021-09-18 LAB — CBC
HCT: 39.4 % (ref 39.0–52.0)
Hemoglobin: 14.5 g/dL (ref 13.0–17.0)
MCH: 31.5 pg (ref 26.0–34.0)
MCHC: 36.8 g/dL — ABNORMAL HIGH (ref 30.0–36.0)
MCV: 85.5 fL (ref 80.0–100.0)
Platelets: 263 10*3/uL (ref 150–400)
RBC: 4.61 MIL/uL (ref 4.22–5.81)
RDW: 12.1 % (ref 11.5–15.5)
WBC: 8.6 10*3/uL (ref 4.0–10.5)
nRBC: 0 % (ref 0.0–0.2)

## 2021-09-18 LAB — TROPONIN I (HIGH SENSITIVITY): Troponin I (High Sensitivity): 3 ng/L (ref <18)

## 2021-09-18 NOTE — ED Provider Notes (Signed)
[facility] Emergency Department Provider Note  ____________________________________________   Event Date/Time   First MD Initiated Contact with Patient 09/18/21 1617     (approximate)  I have reviewed the triage vital signs and the nursing notes.   HISTORY  Chief Complaint Chest Pain   HPI Wayne Flowers is a 45 y.o. male with a history of Type 2 diabetes, hypertension and remaining history as listed below presents to the emergency department for treatment and evaluation of chest pain. Pain has been intermittent for the past week. No cardiac history. No associated symptoms.  Past Medical History:  Diagnosis Date   Allergy    Atelectasis    Chronic kidney disease    KIDNEY STONE   ED (erectile dysfunction)    ED (erectile dysfunction)    GERD (gastroesophageal reflux disease)    Hearing loss    right   History of renal calculi 02/19/2013   Hypertension    Kidney stone 12/29/8655   Metabolic syndrome    Morbid obesity ([place]) 09/08/2017   BMI >35 plus diabetes   Obesity, Class I, BMI 30.0-34.9 (see actual BMI)    Pleurisy    PONV (postoperative nausea and vomiting)    Sleep apnea    HAS NOT USED CPAP IN 6 MONTHS   Type 2 diabetes mellitus ([place]) 06/23/2016    Patient Active Problem List   Diagnosis Date Noted   Uncontrolled type 2 diabetes mellitus with hyperglycemia ([place]) 08/13/2020   Special screening for malignant neoplasms, colon    Polyp of descending colon    Bilateral calf pain 02/04/2020   Partial small bowel obstruction ([place]) 01/25/2020   Class 2 severe obesity with serious comorbidity and body mass index (BMI) of 35.0 to 35.9 in adult ([place]) 09/08/2017   Hearing loss 03/11/2017   Hyperlipidemia LDL goal <70 07/19/2016   Medication monitoring encounter 07/19/2016   Type 2 diabetes mellitus ([place]) 06/23/2016   Allergic rhinitis with postnasal drip 12/22/2015   Obstructive sleep apnea treated with continuous positive airway  pressure (CPAP) 06/16/2015   Hypertension goal BP (blood pressure) < 140/90 06/16/2015   Gastroesophageal reflux disease 06/16/2015   Stiffness of right shoulder joint 06/16/2015   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   Low libido 08/22/2013   Right upper quadrant pain 02/19/2013    Past Surgical History:  Procedure Laterality Date   COLONOSCOPY WITH PROPOFOL N/A 04/21/2020   Procedure: COLONOSCOPY WITH PROPOFOL;  Surgeon: Lucilla Lame, MD;  Location: [place];  Service: Endoscopy;  Laterality: N/A;   CYSTOSCOPY W/ RETROGRADES Left 09/16/2015   Procedure: CYSTOSCOPY WITH RETROGRADE PYELOGRAM;  Surgeon: Royston Cowper, MD;  Location: [facility];  Service: Urology;  Laterality: Left;   CYSTOSCOPY WITH URETEROSCOPY AND STENT PLACEMENT     EXTRACORPOREAL SHOCK WAVE LITHOTRIPSY Left 03/26/2021   Procedure: EXTRACORPOREAL SHOCK WAVE LITHOTRIPSY (ESWL);  Surgeon: Royston Cowper, MD;  Location: [facility];  Service: Urology;  Laterality: Left;   POLYPECTOMY  04/21/2020   Procedure: POLYPECTOMY;  Surgeon: Lucilla Lame, MD;  Location: [place];  Service: Endoscopy;;   URETEROSCOPY WITH HOLMIUM LASER LITHOTRIPSY Left 09/16/2015   Procedure: URETEROSCOPY WITH HOLMIUM LASER LITHOTRIPSY;  Surgeon: Royston Cowper, MD;  Location: [facility];  Service: Urology;  Laterality: Left;   VASECTOMY      Prior to Admission medications   Medication Sig Start Date End Date Taking? Authorizing Provider  atorvastatin (LIPITOR) 40 MG tablet TAKE 1 TABLET BY MOUTH AT  BEDTIME 01/08/21 01/08/22  Delsa Grana, PA-C  blood glucose meter kit and supplies KIT Dispense based on patient and insurance preference. Use once daily for fasting blood sugar monitoring for Dx T2DM uncontrolled, ICD 10-E11.65 08/13/20   Delsa Grana, PA-C  Continuous Blood Gluc Receiver (FREESTYLE LIBRE 2 READER) DEVI USE EVERY 2 WEEKS 01/27/21 01/27/22  Delsa Grana, PA-C  Continuous Blood Gluc Sensor (FREESTYLE LIBRE 2  SENSOR) MISC USE EVERY 2 WEEKS 01/27/21 01/27/22  Delsa Grana, PA-C  dicyclomine (BENTYL) 20 MG tablet Take 1 tablet (20 mg total) by mouth 3 (three) times daily as needed for spasms (abd cramping). 11/12/19   Delsa Grana, PA-C  Dulaglutide 1.5 MG/0.5ML SOPN INJECT 1.5 MG INTO THE SKIN ONCE A WEEK. 10/02/20 10/12/21  Delsa Grana, PA-C  famotidine (PEPCID) 20 MG tablet TAKE 1 TABLET BY MOUTH 2 TIMES DAILY AS NEEDED FOR HEARTBURN OR INDIGESTION. 05/12/21 05/12/22  Delsa Grana, PA-C  fluticasone (FLONASE) 50 MCG/ACT nasal spray USE 2 SPRAYS INTO EACH NOSTRIL ONCE DAILY 11/26/20 11/26/21  Delsa Grana, PA-C  glipiZIDE (GLUCOTROL) 5 MG tablet TAKE 1 TABLET BY MOUTH DAILY BEFORE BREAKFAST. HOLD IF FASTING BLOOD SUGAR LESS THAN 100 OR IF NOT EATING BREAKFAST 05/13/21 05/13/22  Delsa Grana, PA-C  lisinopril (ZESTRIL) 10 MG tablet TAKE 1 TABLET (10 MG TOTAL) BY MOUTH DAILY. 01/08/21 01/08/22  Delsa Grana, PA-C  sildenafil (VIAGRA) 50 MG tablet Take 1-2 tablets (50-100 mg total) by mouth daily as needed for erectile dysfunction (30 mins prior to sexual activity). 02/18/21   Delsa Grana, PA-C    Allergies Omeprazole  Family History  Problem Relation Age of Onset   Diabetes Mother    Cancer Father        renal   Spina bifida Sister    Appendicitis Son    Cancer Maternal Grandfather        black lung   Cancer Paternal Grandmother        lung   Colon cancer Paternal Grandmother    Diabetes Sister     Social History Social History   Tobacco Use   Smoking status: Never   Smokeless tobacco: Never  Vaping Use   Vaping Use: Never used  Substance Use Topics   Alcohol use: Yes    Alcohol/week: 0.0 - 1.0 standard drinks    Comment: rarley   Drug use: No    Review of Systems  Constitutional: No fever/chills. Eyes: No visual changes. ENT: No sore throat. Cardiovascular: Positive for chest pain. Negative for pleuritic pain. Negative for palpitations. Negative for leg pain. Respiratory: Negative  for shortness of breath. Gastrointestinal: Negative abdominal pain. no nausea, no vomiting.  No diarrhea.  No constipation. Genitourinary: Negative for dysuria. Musculoskeletal: Negative for back pain.  Skin: Negative for rash, lesion, wound. Neurological: Negative for headaches, focal weakness or numbness. ____________________________________________   PHYSICAL EXAM:  VITAL SIGNS: ED Triage Vitals [09/18/21 1334]  Enc Vitals Group     BP 130/90     Pulse Rate 82     Resp 17     Temp 98 F (36.7 C)     Temp Source Oral     SpO2 96 %     Weight 245 lb (111.1 kg)     Height 5' 9"  (1.753 m)     Head Circumference      Peak Flow      Pain Score 1     Pain Loc      Pain Edu?  Excl. in [place]?     Constitutional: Alert and oriented. Overall well appearing and in no acute distress. Normal mental status. Eyes: Conjunctivae are normal. PERRL. Head: Atraumatic. Nose: No congestion/rhinnorhea. Mouth/Throat: Mucous membranes are moist.  Oropharynx non-erythematous. Tongue normal in size and color. Neck: No stridor. No carotid bruit appreciated on exam. Hematological/Lymphatic/Immunilogical: No cervical lymphadenopathy. Cardiovascular: Normal rate, regular rhythm. Grossly normal heart sounds.  Good peripheral circulation. Respiratory: Normal respiratory effort.  No retractions. Lungs CTAB. Gastrointestinal: Soft and nontender. No distention. No abdominal bruits. No CVA tenderness. Genitourinary: Exam deferred. Musculoskeletal: No lower extremity tenderness. No edema of extremities. Neurologic:  Normal speech and language. No gross focal neurologic deficits are appreciated. Skin:  Skin is warm, dry and intact. No rash noted. Psychiatric: Mood and affect are normal. Speech and behavior are normal.  ____________________________________________   LABS (all labs ordered are listed, but only abnormal results are displayed)  Labs Reviewed  BASIC METABOLIC PANEL - Abnormal; Notable  for the following components:      Result Value   Glucose, Bld 202 (*)    All other components within normal limits  CBC - Abnormal; Notable for the following components:   MCHC 36.8 (*)    All other components within normal limits  TROPONIN I (HIGH SENSITIVITY)   ____________________________________________  EKG  ED ECG REPORT I, Josiyah Tozzi, FNP-BC personally viewed and interpreted this ECG.   Date: 09/18/2021  EKG Time: 1328  Rate: 83  Rhythm: normal EKG, normal sinus rhythm  Axis: normal  Intervals:none  ST&T Change: no ST elevation  ____________________________________________  RADIOLOGY  ED MD interpretation:  No cardiopulmonary abnormality.  I, Sherrie George, personally viewed and evaluated these images (plain radiographs) as part of my medical decision making, as well as reviewing the written report by the radiologist.  Official radiology report(s): DG Chest 2 View  Result Date: 09/18/2021 CLINICAL DATA:  Intermittent left-sided chest pain for the past week. EXAM: CHEST - 2 VIEW COMPARISON:  Chest x-ray dated December 29, 2012. FINDINGS: The heart size and mediastinal contours are within normal limits. Both lungs are clear. The visualized skeletal structures are unremarkable. IMPRESSION: No active cardiopulmonary disease. Electronically Signed   By: Titus Dubin M.D.   On: 09/18/2021 14:28    ____________________________________________   PROCEDURES  Procedure(s) performed: None  Procedures  Critical Care performed: No  ____________________________________________   INITIAL IMPRESSION / ASSESSMENT AND PLAN      Differential diagnosis includes, but not limited to:  Differential includes, but is not limited to, viral syndrome, bronchitis including COPD exacerbation, reactive airway disease including asthma, CHF including exacerbation with or without pulmonary/interstitial edema, pneumothorax, ACS, thoracic trauma, and pulmonary embolism, ACS, aortic  dissection, pulmonary embolism, cardiac tamponade, pneumothorax, pneumonia, pericarditis, myocarditis, GI-related causes including esophagitis/gastritis, and musculoskeletal chest wall pain.    ED COURSE  Labs including troponin of 3 are reassuring. EKG shows normal sinus rhythm without ST elevation or new left bundle branch block. Patient is pain free at time of disposition. He will be referred to cardiology. Strict ER return precautions discussed.     FINAL CLINICAL IMPRESSION(S) / ED DIAGNOSES  Final diagnoses:  Atypical chest pain     ED Discharge Orders     None        Ilhan Madan was evaluated in Emergency Department on 09/18/2021 for the symptoms described in the history of present illness. He was evaluated in the context of the global COVID-19 pandemic, which necessitated consideration that the patient  might be at risk for infection with the SARS-CoV-2 virus that causes COVID-19. Institutional protocols and algorithms that pertain to the evaluation of patients at risk for COVID-19 are in a state of rapid change based on information released by regulatory bodies including the CDC and federal and state organizations. These policies and algorithms were followed during the patient's care in the ED.   Note:  This document was prepared using Dragon voice recognition software and may include unintentional dictation errors.    Victorino Dike, FNP 09/18/21 1716    Vanessa [place], MD 09/20/21 2015

## 2021-09-18 NOTE — ED Triage Notes (Signed)
Pt c/o nonradiating left sided chest pain off and on for the past week. Denies nausea, diaphoresis or SOB.Marland Kitchen pt is ambulatory to triage with a steady gait, no distress noted

## 2021-09-21 ENCOUNTER — Other Ambulatory Visit: Payer: Self-pay

## 2021-09-21 ENCOUNTER — Encounter: Payer: Self-pay | Admitting: Cardiovascular Disease

## 2021-09-21 ENCOUNTER — Ambulatory Visit: Payer: 59 | Admitting: Cardiovascular Disease

## 2021-09-21 VITALS — BP 130/90 | HR 76 | Ht 69.0 in | Wt 237.2 lb

## 2021-09-21 DIAGNOSIS — R079 Chest pain, unspecified: Secondary | ICD-10-CM | POA: Diagnosis not present

## 2021-09-21 DIAGNOSIS — E1165 Type 2 diabetes mellitus with hyperglycemia: Secondary | ICD-10-CM

## 2021-09-21 DIAGNOSIS — R9431 Abnormal electrocardiogram [ECG] [EKG]: Secondary | ICD-10-CM | POA: Diagnosis not present

## 2021-09-21 DIAGNOSIS — E785 Hyperlipidemia, unspecified: Secondary | ICD-10-CM

## 2021-09-21 MED ORDER — METOPROLOL TARTRATE 100 MG PO TABS
100.0000 mg | ORAL_TABLET | Freq: Once | ORAL | 0 refills | Status: DC
  Filled 2021-09-21: qty 1, 1d supply, fill #0

## 2021-09-21 NOTE — Patient Instructions (Addendum)
Medication Instructions:  No changes, continue your current medication.   *If you need a refill on your cardiac medications before your next appointment, please call your pharmacy*  Lab Work: None needed at this time   Testing/Procedures: Cardiac CTA   Follow-Up: At [facility], you and your health needs are our priority.  As part of our continuing mission to provide you with exceptional heart care, we have created designated Provider Care Teams.  These Care Teams include your primary Cardiologist (physician) and Advanced Practice Providers (APPs -  Physician Assistants and Nurse Practitioners) who all work together to provide you with the care you need, when you need it.   Your next appointment:   12 month(s)  The format for your next appointment:   In Person  Provider:   You may see Dr. Rockey Situ or one of the following Advanced Practice Providers on your designated Care Team:   Murray Hodgkins, NP Christell Faith, PA-C Cadence Kathlen Mody, [place]   Other Instructions Cardiac (coronary) CTA   [facility] [address], [place] [address] [phone]  Date: 10/15/2021   Time: 1:15 pm Please arrive 15 mins early for check-in and test prep.  Please follow these instructions carefully:  Hold all erectile dysfunction medications at least 3 days (72 hrs) prior to test.  On the Night Before the Test: Be sure to Drink plenty of water. Do not consume any caffeinated/decaffeinated beverages or chocolate 12 hours prior to your test. This includes decaf as well Do not take any antihistamines 12 hours prior to your test. Such as Benadryl  On the Day of the Test: Drink plenty of water until 1 hour prior to the test. Do not eat any food 4 hours prior to the test. You may take your regular medications prior to the test.  Take metoprolol (Lopressor) two hours prior to test. Lopressor 100 mg This was sent in to your pharmacy for  pick-up HOLD Furosemide/Hydrochlorothiazide morning of the test. No fluid pills  After the Test: Drink plenty of water. After receiving IV contrast, you may experience a mild flushed feeling. This is normal. On occasion, you may experience a mild rash up to 24 hours after the test. This is not dangerous. If this occurs, you can take Benadryl 25 mg and increase your fluid intake (to flush out the kidneys) If you experience trouble breathing, this can be serious. If it is severe call 911 IMMEDIATELY. If it is mild, please call our office. If you take any of these medications:  Glipizide/Metformin Avandament,  Glucavance,  please do not take 48 hours after completing test unless otherwise instructed.  For scheduling needs, including cancellations and rescheduling, please call [place], [phone].

## 2021-09-21 NOTE — Progress Notes (Signed)
Cardiology Office Note  Date:  09/21/2021   ID:  Wayne Flowers, Wayne Flowers 20-Feb-1976, MRN [id number]  PCP:  Wayne Grana, PA-C   Chief Complaint  Patient presents with   [facility] ER Follow up    Patient c/o chest pain, a stabbing in chest that comes and goes as well as a pounding sensation in chest. Medications reviewed by the patient verbally.     HPI:  [name] is a 45 year old gentleman with past medical history of Diabetes type 2 Hypertension Hyperlipidemia Who presents by referral from Wayne Flowers for consultation of his angina/chest pain symptoms  Reports having recent symptoms of left-sided chest pain Symptoms off and on, as symptoms did not improve he presented to the emergency room  Recently seen in emergency room September 18, 2021 for chest pain Cardiac enzymes negative, EKG with abnormality and was referred to our office  Symptoms have continued to wax and wane, currently no chest pain but concerned given prior symptoms and his risk factors  EKG personally reviewed by myself on todays visit Normal sinus rhythm rate 76 bpm consider old inferior MI   PMH:   has a past medical history of Allergy, Atelectasis, Chronic kidney disease, ED (erectile dysfunction), ED (erectile dysfunction), GERD (gastroesophageal reflux disease), Hearing loss, History of renal calculi (02/19/2013), Hypertension, Kidney stone (05/14/736), Metabolic syndrome, Morbid obesity ([place]) (09/08/2017), Obesity, Class I, BMI 30.0-34.9 (see actual BMI), Pleurisy, PONV (postoperative nausea and vomiting), Sleep apnea, and Type 2 diabetes mellitus (Wayne Flowers) (06/23/2016).  PSH:    Past Surgical History:  Procedure Laterality Date   COLONOSCOPY WITH PROPOFOL N/A 04/21/2020   Procedure: COLONOSCOPY WITH PROPOFOL;  Surgeon: Lucilla Lame, MD;  Location: [place];  Service: Endoscopy;  Laterality: N/A;   CYSTOSCOPY W/ RETROGRADES Left 09/16/2015   Procedure: CYSTOSCOPY WITH RETROGRADE PYELOGRAM;   Surgeon: Royston Cowper, MD;  Location: [facility];  Service: Urology;  Laterality: Left;   CYSTOSCOPY WITH URETEROSCOPY AND STENT PLACEMENT     EXTRACORPOREAL SHOCK WAVE LITHOTRIPSY Left 03/26/2021   Procedure: EXTRACORPOREAL SHOCK WAVE LITHOTRIPSY (ESWL);  Surgeon: Royston Cowper, MD;  Location: [facility];  Service: Urology;  Laterality: Left;   POLYPECTOMY  04/21/2020   Procedure: POLYPECTOMY;  Surgeon: Lucilla Lame, MD;  Location: [place];  Service: Endoscopy;;   URETEROSCOPY WITH HOLMIUM LASER LITHOTRIPSY Left 09/16/2015   Procedure: URETEROSCOPY WITH HOLMIUM LASER LITHOTRIPSY;  Surgeon: Royston Cowper, MD;  Location: [facility];  Service: Urology;  Laterality: Left;   VASECTOMY      Current Outpatient Medications  Medication Sig Dispense Refill   atorvastatin (LIPITOR) 40 MG tablet TAKE 1 TABLET BY MOUTH AT BEDTIME 90 tablet 3   blood glucose meter kit and supplies KIT Dispense based on patient and insurance preference. Use once daily for fasting blood sugar monitoring for Dx T2DM uncontrolled, ICD 10-E11.65 1 each 0   Continuous Blood Gluc Receiver (FREESTYLE LIBRE 2 READER) DEVI USE EVERY 2 WEEKS 2 each 1   Continuous Blood Gluc Sensor (FREESTYLE LIBRE 2 SENSOR) MISC USE EVERY 2 WEEKS 2 each 1   dicyclomine (BENTYL) 20 MG tablet Take 1 tablet (20 mg total) by mouth 3 (three) times daily as needed for spasms (abd cramping). 60 tablet 1   Dulaglutide 1.5 MG/0.5ML SOPN INJECT 1.5 MG INTO THE SKIN ONCE A WEEK. 6 mL 3   famotidine (PEPCID) 20 MG tablet TAKE 1 TABLET BY MOUTH 2 TIMES DAILY AS NEEDED FOR HEARTBURN OR INDIGESTION. 180 tablet 3  fluticasone (FLONASE) 50 MCG/ACT nasal spray USE 2 SPRAYS INTO EACH NOSTRIL ONCE DAILY 16 g 3   glipiZIDE (GLUCOTROL) 5 MG tablet TAKE 1 TABLET BY MOUTH DAILY BEFORE BREAKFAST. HOLD IF FASTING BLOOD SUGAR LESS THAN 100 OR IF NOT EATING BREAKFAST 90 tablet 3   lisinopril (ZESTRIL) 10 MG tablet TAKE 1 TABLET (10 MG TOTAL) BY MOUTH DAILY. 90  tablet 3   metoprolol tartrate (LOPRESSOR) 100 MG tablet Take 1 tablet (100 mg total) by mouth once for 1 dose. Take 2 hours before your Cardiac CTA procedure 1 tablet 0   sildenafil (VIAGRA) 50 MG tablet Take 1-2 tablets (50-100 mg total) by mouth daily as needed for erectile dysfunction (30 mins prior to sexual activity). 30 tablet 3   No current facility-administered medications for this visit.    Allergies:   Omeprazole   Social History:  The patient  reports that he has never smoked. He has never used smokeless tobacco. He reports current alcohol use. He reports that he does not use drugs.   Family History:   family history includes Appendicitis in his son; Cancer in his father, maternal grandfather, and paternal grandmother; Colon cancer in his paternal grandmother; Diabetes in his mother and sister; Spina bifida in his sister.    Review of Systems: Review of Systems  Constitutional: Negative.   HENT: Negative.    Respiratory: Negative.    Cardiovascular: Negative.   Gastrointestinal: Negative.   Musculoskeletal: Negative.   Neurological: Negative.   Psychiatric/Behavioral: Negative.    All other systems reviewed and are negative.   PHYSICAL EXAM: VS:  BP 130/90 (BP Location: Right Arm, Patient Position: Sitting, Cuff Size: Normal)   Pulse 76   Ht _0  (1.753 m)   Wt 237 lb 4 oz (107.6 kg)   SpO2 98%   BMI 35.04 kg/m  , BMI Body mass index is 35.04 kg/m. GEN: Well nourished, well developed, in no acute distress HEENT: normal Neck: no JVD, carotid bruits, or masses Cardiac: RRR; no murmurs, rubs, or gallops,no edema  Respiratory:  clear to auscultation bilaterally, normal work of breathing GI: soft, nontender, nondistended, + BS MS: no deformity or atrophy Skin: warm and dry, no rash Neuro:  Strength and sensation are intact Psych: euthymic mood, full affect  Recent Labs: 05/12/2021: ALT 30 09/18/2021: BUN 12; Creatinine, Ser 0.95; Hemoglobin 14.5; Platelets 263;  Potassium 4.1; Sodium 137    Lipid Panel Lab Results  Component Value Date   CHOL 122 05/12/2021   HDL 39 (L) 05/12/2021   LDLCALC 60 05/12/2021   TRIG 145 05/12/2021      Wt Readings from Last 3 Encounters:  09/21/21 237 lb 4 oz (107.6 kg)  09/18/21 245 lb (111.1 kg)  05/12/21 244 lb 9.6 oz (110.9 kg)     ASSESSMENT AND PLAN:  Problem List Items Addressed This Visit       Cardiology Problems   Hyperlipidemia LDL goal <70   Relevant Medications   metoprolol tartrate (LOPRESSOR) 100 MG tablet   Other Relevant Orders   CT CORONARY MORPH W/CTA COR W/SCORE W/CA W/CM &/OR WO/CM     Other   Uncontrolled type 2 diabetes mellitus with hyperglycemia (HCC)   Relevant Orders   CT CORONARY MORPH W/CTA COR W/SCORE W/CA W/CM &/OR WO/CM   Other Visit Diagnoses     Chest pain of uncertain etiology    -  Primary   Relevant Orders   EKG 12-Lead   CT CORONARY MORPH W/CTA COR  W/SCORE W/CA W/CM &/OR WO/CM   Nonspecific abnormal electrocardiogram (ECG) (EKG)       Relevant Orders   CT CORONARY MORPH W/CTA COR W/SCORE W/CA W/CM &/OR WO/CM      Chest pain/angina Cholesterol elevated by history, 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  Hyperlipidemia Cholesterol is at goal on the current lipid regimen. No changes to the medications were made.    Total encounter time more than 60 minutes  Greater than 50% was spent in counseling and coordination of care with the patient  Patient seen in consultation for Wayne Flowers will be referred back to her office for ongoing care of the issues detailed above  Signed, Esmond Plants, M.D., Ph.D. [place], [place]

## 2021-09-27 MED FILL — Lisinopril Tab 10 MG: ORAL | 30 days supply | Qty: 30 | Fill #6 | Status: AC

## 2021-09-28 ENCOUNTER — Other Ambulatory Visit: Payer: Self-pay

## 2021-10-14 ENCOUNTER — Telehealth (HOSPITAL_COMMUNITY): Payer: Self-pay | Admitting: Emergency Medicine

## 2021-10-14 NOTE — Telephone Encounter (Signed)
Reaching out to patient to offer assistance regarding upcoming cardiac imaging study; pt verbalizes understanding of appt date/time, parking situation and where to check in, pre-test NPO status and medications ordered, and verified current allergies; name and call back number provided for further questions should they arise Marchia Bond RN [facility] [phone] office [phone] cell  Arrival time: 1:00pm 100mg  metoprolol tart Denies iv isuses

## 2021-10-15 ENCOUNTER — Ambulatory Visit
Admission: RE | Admit: 2021-10-15 | Discharge: 2021-10-15 | Disposition: A | Discharge status: Home / Self Care | Payer: 59 | Source: Ambulatory Visit | Attending: Cardiovascular Disease | Admitting: Cardiovascular Disease

## 2021-10-15 ENCOUNTER — Other Ambulatory Visit: Payer: Self-pay

## 2021-10-15 DIAGNOSIS — R079 Chest pain, unspecified: Secondary | ICD-10-CM

## 2021-10-15 DIAGNOSIS — E785 Hyperlipidemia, unspecified: Secondary | ICD-10-CM

## 2021-10-15 DIAGNOSIS — E1165 Type 2 diabetes mellitus with hyperglycemia: Secondary | ICD-10-CM

## 2021-10-15 DIAGNOSIS — R9431 Abnormal electrocardiogram [ECG] [EKG]: Secondary | ICD-10-CM

## 2021-10-15 MED ORDER — NITROGLYCERIN 0.4 MG SL SUBL
0.8000 mg | SUBLINGUAL_TABLET | Freq: Once | SUBLINGUAL | Status: AC
  Administered 2021-10-15: 13:00:00 0.8 mg via SUBLINGUAL

## 2021-10-15 MED ORDER — METOPROLOL TARTRATE 5 MG/5ML IV SOLN
10.0000 mg | Freq: Once | INTRAVENOUS | Status: AC
  Administered 2021-10-15: 14:00:00 10 mg via INTRAVENOUS

## 2021-10-15 MED ORDER — METOPROLOL TARTRATE 5 MG/5ML IV SOLN
10.0000 mg | Freq: Once | INTRAVENOUS | Status: AC
  Administered 2021-10-15: 13:00:00 10 mg via INTRAVENOUS

## 2021-10-15 MED ORDER — IOHEXOL 350 MG/ML SOLN
100.0000 mL | Freq: Once | INTRAVENOUS | Status: AC | PRN
  Administered 2021-10-15: 17:00:00 100 mL via INTRAVENOUS

## 2021-10-15 NOTE — Progress Notes (Signed)
Patient tolerated CT well. Drank water after. Vital signs stable encourage to drink water throughout day.Reasons explained and verbalized understanding. Ambulated steady gait.  

## 2021-10-17 ENCOUNTER — Other Ambulatory Visit: Payer: Self-pay | Admitting: Family Medicine

## 2021-10-17 NOTE — Telephone Encounter (Signed)
Requested medication (s) are due for refill today: yes  Requested medication (s) are on the active medication list: expired 10/12/21  Last refill:  10/02/20  Future visit scheduled: yes  Notes to clinic:  med has expired   Requested Prescriptions  Pending Prescriptions Disp Refills   Dulaglutide (TRULICITY) 1.5 VH/8.4ON SOPN 6 mL 3    Sig: INJECT 1.5 MG INTO THE SKIN ONCE A WEEK.     Endocrinology:  Diabetes - GLP-1 Receptor Agonists Passed - 10/17/2021  4:03 PM      Passed - HBA1C is between 0 and 7.9 and within 180 days    Hgb A1c MFr Bld  Date Value Ref Range Status  05/12/2021 7.0 (H) <5.7 % of total Hgb Final    Comment:    For someone without known diabetes, a hemoglobin A1c value of 6.5% or greater indicates that they may have  diabetes and this should be confirmed with a follow-up  test. . For someone with known diabetes, a value <7% indicates  that their diabetes is well controlled and a value  greater than or equal to 7% indicates suboptimal  control. A1c targets should be individualized based on  duration of diabetes, age, comorbid conditions, and  other considerations. . Currently, no consensus exists regarding use of hemoglobin A1c for diagnosis of diabetes for children. Renella Cunas - Valid encounter within last 6 months    Recent Outpatient Visits           5 months ago Type 2 diabetes mellitus with other specified complication, without long-term current use of insulin [facility])   [facility], Kristeen Miss, PA-C   9 months ago Type 2 diabetes mellitus with other specified complication, without long-term current use of insulin [facility])   [facility] Delsa Grana, PA-C   1 year ago Acute pain of right shoulder   [facility] Delsa Grana, PA-C   1 year ago Uncontrolled type 2 diabetes mellitus with hyperglycemia [facility])   [facility] Delsa Grana, PA-C   1 year ago Uncontrolled  type 2 diabetes mellitus with hyperglycemia [facility])   [facility] Delsa Grana, PA-C       Future Appointments             In 3 weeks Delsa Grana, PA-C [facility], [facility]

## 2021-10-19 ENCOUNTER — Other Ambulatory Visit: Payer: Self-pay

## 2021-10-19 MED ORDER — TRULICITY 1.5 MG/0.5ML ~~LOC~~ SOAJ
1.5000 mg | SUBCUTANEOUS | 3 refills | Status: DC
  Filled 2021-10-19: qty 2, 28d supply, fill #0
  Filled 2021-11-15: qty 2, 28d supply, fill #1
  Filled 2021-12-15: qty 2, 28d supply, fill #2
  Filled 2022-01-11: qty 2, 28d supply, fill #3
  Filled 2022-02-15: qty 2, 28d supply, fill #4

## 2021-10-20 MED FILL — Atorvastatin Calcium Tab 40 MG (Base Equivalent): ORAL | 30 days supply | Qty: 30 | Fill #6 | Status: AC

## 2021-10-21 ENCOUNTER — Other Ambulatory Visit: Payer: Self-pay

## 2021-10-21 MED ORDER — FLUARIX QUADRIVALENT 0.5 ML IM SUSY
PREFILLED_SYRINGE | INTRAMUSCULAR | 0 refills | Status: DC
Start: 2021-10-21 — End: 2021-11-18
  Filled 2021-10-21: qty 0.5, 1d supply, fill #0

## 2021-10-26 ENCOUNTER — Telehealth: Payer: Self-pay

## 2021-10-26 NOTE — Telephone Encounter (Signed)
Able to reach pt regarding Wayne Flowers recent CCTA Dr. Rockey Situ had a chance to review Wayne Flowers results and advised   "Normal cardiac CTA  No significant coronary disease  Little bit of fatty liver otherwise normal "  Wayne Flowers very thankful for the phone call of Wayne Flowers results, all questions and concerns were address with nothing further at this time. Will see at next schedule f/u appt.

## 2021-10-28 ENCOUNTER — Other Ambulatory Visit: Payer: Self-pay

## 2021-10-28 MED FILL — Lisinopril Tab 10 MG: ORAL | 30 days supply | Qty: 30 | Fill #7 | Status: AC

## 2021-11-12 ENCOUNTER — Ambulatory Visit: Payer: 59 | Admitting: Family Medicine

## 2021-11-15 ENCOUNTER — Other Ambulatory Visit: Payer: Self-pay

## 2021-11-16 ENCOUNTER — Other Ambulatory Visit: Payer: Self-pay

## 2021-11-17 NOTE — Progress Notes (Signed)
Name: Wayne Flowers   MRN: [id number]    DOB: [birth year]   Date:11/18/2021       Progress Note  Subjective  Chief Complaint  Follow Up  HPI  DM type II: he was diagnosed about 10 years ago, he has tried Metformin but caused diarrhea. He is currently on trulicity 1.5 mg but afraid of going up due to personal of small bowel obstruction. He took Jardiance in the past but not sure why it was stopped. He was given glipizide but did not noticed an improvement in reading and stopped medication. He likes to use the free style Elenor Legato and would like a refill. He does better when he uses it . He has polyuria but denies polyphagia or polydipsia. His eye exam is up to date, urine micro negative. LDL at goal   Dyslipidemia: we will continue Atorvastatin  Chest pain: went to [facility] had a follow up with Dr. Rockey Situ and cardiac calcium score was zero. He states heartburn has been worse lately and thinks that is the cause of his symptoms. He cannot take PPI because it cause memory loss, but has been taking Pepcid bid   Fatty liver : discussed importance of weight loss  Morbid obesity; BMI above 35, he has associated DM, HTN, dyslipidemia   Patient Active Problem List   Diagnosis Date Noted   Uncontrolled type 2 diabetes mellitus with hyperglycemia ([place]) 08/13/2020   Polyp of descending colon    History of small bowel obstruction 01/25/2020   Class 2 severe obesity with serious comorbidity and body mass index (BMI) of 35.0 to 35.9 in adult ([place]) 09/08/2017   Hearing loss 03/11/2017   Hyperlipidemia LDL goal <70 07/19/2016   Allergic rhinitis with postnasal drip 12/22/2015   OSA (obstructive sleep apnea) 06/16/2015   Hypertension goal BP (blood pressure) < 140/90 06/16/2015   Gastroesophageal reflux disease 06/16/2015   Stiffness of right shoulder joint 06/16/2015   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   Low libido 08/22/2013    Past Surgical History:  Procedure Laterality  Date   COLONOSCOPY WITH PROPOFOL N/A 04/21/2020   Procedure: COLONOSCOPY WITH PROPOFOL;  Surgeon: Lucilla Lame, MD;  Location: [place];  Service: Endoscopy;  Laterality: N/A;   CYSTOSCOPY W/ RETROGRADES Left 09/16/2015   Procedure: CYSTOSCOPY WITH RETROGRADE PYELOGRAM;  Surgeon: Royston Cowper, MD;  Location: [facility];  Service: Urology;  Laterality: Left;   CYSTOSCOPY WITH URETEROSCOPY AND STENT PLACEMENT     EXTRACORPOREAL SHOCK WAVE LITHOTRIPSY Left 03/26/2021   Procedure: EXTRACORPOREAL SHOCK WAVE LITHOTRIPSY (ESWL);  Surgeon: Royston Cowper, MD;  Location: [facility];  Service: Urology;  Laterality: Left;   POLYPECTOMY  04/21/2020   Procedure: POLYPECTOMY;  Surgeon: Lucilla Lame, MD;  Location: [place];  Service: Endoscopy;;   URETEROSCOPY WITH HOLMIUM LASER LITHOTRIPSY Left 09/16/2015   Procedure: URETEROSCOPY WITH HOLMIUM LASER LITHOTRIPSY;  Surgeon: Royston Cowper, MD;  Location: [facility];  Service: Urology;  Laterality: Left;   VASECTOMY      Family History  Problem Relation Age of Onset   Diabetes Mother    Cancer Father        renal   Spina bifida Sister    Appendicitis Son    Cancer Maternal Grandfather        black lung   Cancer Paternal Grandmother        lung   Colon cancer Paternal Grandmother    Diabetes Sister     Social History  Tobacco Use   Smoking status: Never   Smokeless tobacco: Never  Substance Use Topics   Alcohol use: Yes    Alcohol/week: 0.0 - 1.0 standard drinks    Comment: rarley     Current Outpatient Medications:    atorvastatin (LIPITOR) 40 MG tablet, TAKE 1 TABLET BY MOUTH AT BEDTIME, Disp: 90 tablet, Rfl: 3   blood glucose meter kit and supplies KIT, Dispense based on patient and insurance preference. Use once daily for fasting blood sugar monitoring for Dx T2DM uncontrolled, ICD 10-E11.65, Disp: 1 each, Rfl: 0   dicyclomine (BENTYL) 20 MG tablet, Take 1 tablet (20 mg total) by mouth 3 (three) times daily as  needed for spasms (abd cramping)., Disp: 60 tablet, Rfl: 1   Dulaglutide (TRULICITY) 1.5 DX/4.1OI SOPN, INJECT 1.5 MG INTO THE SKIN ONCE A WEEK., Disp: 6 mL, Rfl: 3   famotidine (PEPCID) 20 MG tablet, TAKE 1 TABLET BY MOUTH 2 TIMES DAILY AS NEEDED FOR HEARTBURN OR INDIGESTION., Disp: 180 tablet, Rfl: 3   fluticasone (FLONASE) 50 MCG/ACT nasal spray, USE 2 SPRAYS INTO EACH NOSTRIL ONCE DAILY, Disp: 16 g, Rfl: 3   lisinopril (ZESTRIL) 10 MG tablet, TAKE 1 TABLET (10 MG TOTAL) BY MOUTH DAILY., Disp: 90 tablet, Rfl: 3   sildenafil (VIAGRA) 50 MG tablet, Take 1-2 tablets (50-100 mg total) by mouth daily as needed for erectile dysfunction (30 mins prior to sexual activity)., Disp: 30 tablet, Rfl: 3  Allergies  Allergen Reactions   Omeprazole Other (See Comments)    Reaction:  Memory loss     I personally reviewed active problem list, medication list, allergies, family history, social history, health maintenance with the patient/caregiver today.   ROS  Constitutional: Negative for fever or weight change.  Respiratory: Negative for cough and shortness of breath.   Cardiovascular: Negative for chest pain or palpitations.  Gastrointestinal: Negative for abdominal pain, no bowel changes.  Musculoskeletal: Negative for gait problem or joint swelling.  Skin: Negative for rash.  Neurological: Negative for dizziness or headache.  No other specific complaints in a complete review of systems (except as listed in HPI above).   Objective  Vitals:   11/18/21 1519  BP: 132/68  Pulse: 100  Resp: 16  Temp: 98.2 F (36.8 C)  SpO2: 99%  Weight: 238 lb (108 kg)  Height: $Remove'5\' 9"'lLpqdmj$  (1.753 m)    Body mass index is 35.15 kg/m.  Physical Exam  Constitutional: Patient appears well-developed and well-nourished. Obese  No distress.  HEENT: head atraumatic, normocephalic, pupils equal and reactive to light, neck supple Cardiovascular: Normal rate, regular rhythm and normal heart sounds.  No murmur heard. No  BLE edema. Pulmonary/Chest: Effort normal and breath sounds normal. No respiratory distress. Abdominal: Soft.  There is no tenderness. Psychiatric: Patient has a normal mood and affect. behavior is normal. Judgment and thought content normal.   Recent Results (from the past 2160 hour(s))  Basic metabolic panel     Status: Abnormal   Collection Time: 09/18/21  1:31 PM  Result Value Ref Range   Sodium 137 135 - 145 mmol/L   Potassium 4.1 3.5 - 5.1 mmol/L   Chloride 103 98 - 111 mmol/L   CO2 24 22 - 32 mmol/L   Glucose, Bld 202 (H) 70 - 99 mg/dL    Comment: Glucose reference range applies only to samples taken after fasting for at least 8 hours.   BUN 12 6 - 20 mg/dL   Creatinine, Ser 0.95 0.61 - 1.24  mg/dL   Calcium 8.9 8.9 - 10.3 mg/dL   GFR, Estimated >60 >60 mL/min    Comment: (NOTE) Calculated using the CKD-EPI Creatinine Equation (2021)    Anion gap 10 5 - 15    Comment: Performed at [facility], [address] [place], [place] [address]  CBC     Status: Abnormal   Collection Time: 09/18/21  1:31 PM  Result Value Ref Range   WBC 8.6 4.0 - 10.5 K/uL   RBC 4.61 4.22 - 5.81 MIL/uL   Hemoglobin 14.5 13.0 - 17.0 g/dL   HCT 39.4 39.0 - 52.0 %   MCV 85.5 80.0 - 100.0 fL   MCH 31.5 26.0 - 34.0 pg   MCHC 36.8 (H) 30.0 - 36.0 g/dL   RDW 12.1 11.5 - 15.5 %   Platelets 263 150 - 400 K/uL   nRBC 0.0 0.0 - 0.2 %    Comment: Performed at [facility], [place], [place] [address]  Troponin I (High Sensitivity)     Status: None   Collection Time: 09/18/21  1:31 PM  Result Value Ref Range   Troponin I (High Sensitivity) 3 <18 ng/L    Comment: (NOTE) Elevated high sensitivity troponin I (hsTnI) values and significant  changes across serial measurements may suggest ACS but many other  chronic and acute conditions are known to elevate hsTnI results.  Refer to the "Links" section for chest pain algorithms and additional  guidance. Performed at  [facility], [address] [place], [place] [address]   POCT HgB A1C     Status: Abnormal   Collection Time: 11/18/21  3:28 PM  Result Value Ref Range   Hemoglobin A1C 8.8 (A) 4.0 - 5.6 %   HbA1c POC (<> result, manual entry)     HbA1c, POC (prediabetic range)     HbA1c, POC (controlled diabetic range)       PHQ2/9: Depression screen [facility] 2/9 11/18/2021 05/12/2021 01/08/2021 09/18/2020 09/11/2020  Decreased Interest 0 0 0 0 0  Down, Depressed, Hopeless 0 0 0 0 0  PHQ - 2 Score 0 0 0 0 0  Altered sleeping 0 0 0 - -  Tired, decreased energy 0 0 0 - -  Change in appetite 0 0 0 - -  Feeling bad or failure about yourself  0 0 0 - -  Trouble concentrating 0 0 0 - -  Moving slowly or fidgety/restless 0 0 0 - -  Suicidal thoughts 0 0 0 - -  PHQ-9 Score 0 0 0 - -  Difficult doing work/chores - Not difficult at all Not difficult at all - -  Some recent data might be hidden    phq 9 is negative   Fall Risk: Fall Risk  11/18/2021 05/12/2021 01/08/2021 09/18/2020 09/11/2020  Falls in the past year? 0 0 0 1 0  Comment - - - [facility] -  Number falls in past yr: 0 0 0 0 0  Injury with Fall? 0 0 0 1 0  Risk for fall due to : No Fall Risks - - History of fall(s) -  Follow up Falls prevention discussed - - - Falls evaluation completed      Functional Status Survey: Is the patient deaf or have difficulty hearing?: Yes Does the patient have difficulty seeing, even when wearing glasses/contacts?: No Does the patient have difficulty concentrating, remembering, or making decisions?: No Does the patient have difficulty walking or climbing stairs?: No Does the  patient have difficulty dressing or bathing?: No Does the patient have difficulty doing errands alone such as visiting a doctor's office or shopping?: No    Assessment & Plan   1. Uncontrolled type 2 diabetes mellitus with hyperglycemia (HCC)  - POCT HgB A1C  2. Morbid obesity ([place])  Discussed with the patient the risk  posed by an increased BMI. Discussed importance of portion control, calorie counting and at least 150 minutes of physical activity weekly. Avoid sweet beverages and drink more water. Eat at least 6 servings of fruit and vegetables daily    3. Hyperlipidemia LDL goal <70  Continue medication    4. GERD without esophagitis  Advised to follow a GERD appropriate diet and if no improvement we will place referral to GI

## 2021-11-18 ENCOUNTER — Encounter: Payer: Self-pay | Admitting: Family Medicine

## 2021-11-18 ENCOUNTER — Ambulatory Visit: Payer: 59 | Admitting: Family Medicine

## 2021-11-18 ENCOUNTER — Other Ambulatory Visit: Payer: Self-pay

## 2021-11-18 VITALS — BP 132/68 | HR 100 | Temp 98.2°F | Resp 16 | Ht 69.0 in | Wt 238.0 lb

## 2021-11-18 DIAGNOSIS — K219 Gastro-esophageal reflux disease without esophagitis: Secondary | ICD-10-CM

## 2021-11-18 DIAGNOSIS — E785 Hyperlipidemia, unspecified: Secondary | ICD-10-CM

## 2021-11-18 DIAGNOSIS — E1169 Type 2 diabetes mellitus with other specified complication: Secondary | ICD-10-CM

## 2021-11-18 LAB — POCT GLYCOSYLATED HEMOGLOBIN (HGB A1C): Hemoglobin A1C: 8.8 % — AB (ref 4.0–5.6)

## 2021-11-18 MED ORDER — FREESTYLE LIBRE 3 SENSOR MISC
1.0000 | 1 refills | Status: DC
Start: 2021-11-18 — End: 2022-02-16
  Filled 2021-11-18: qty 6, 84d supply, fill #0
  Filled 2022-01-23: qty 6, 84d supply, fill #1

## 2021-11-18 MED ORDER — EMPAGLIFLOZIN 25 MG PO TABS
25.0000 mg | ORAL_TABLET | Freq: Every day | ORAL | 1 refills | Status: DC
Start: 2021-11-18 — End: 2022-02-16
  Filled 2021-11-18: qty 30, 30d supply, fill #0

## 2021-11-18 NOTE — Patient Instructions (Signed)

## 2021-11-19 ENCOUNTER — Other Ambulatory Visit: Payer: Self-pay

## 2021-11-20 ENCOUNTER — Other Ambulatory Visit: Payer: Self-pay

## 2021-11-24 ENCOUNTER — Encounter: Payer: Self-pay | Admitting: Family Medicine

## 2021-11-25 ENCOUNTER — Emergency Department
Admission: EM | Admit: 2021-11-25 | Discharge: 2021-11-25 | Disposition: A | Discharge status: Home / Self Care | Payer: 59 | Attending: Emergency Medicine | Admitting: Emergency Medicine

## 2021-11-25 ENCOUNTER — Other Ambulatory Visit: Payer: Self-pay

## 2021-11-25 ENCOUNTER — Emergency Department: Payer: 59

## 2021-11-25 ENCOUNTER — Ambulatory Visit: Payer: 59 | Admitting: Physician Assistant

## 2021-11-25 DIAGNOSIS — E1122 Type 2 diabetes mellitus with diabetic chronic kidney disease: Secondary | ICD-10-CM | POA: Diagnosis not present

## 2021-11-25 DIAGNOSIS — N200 Calculus of kidney: Secondary | ICD-10-CM | POA: Insufficient documentation

## 2021-11-25 DIAGNOSIS — I129 Hypertensive chronic kidney disease with stage 1 through stage 4 chronic kidney disease, or unspecified chronic kidney disease: Secondary | ICD-10-CM | POA: Diagnosis not present

## 2021-11-25 DIAGNOSIS — N179 Acute kidney failure, unspecified: Secondary | ICD-10-CM | POA: Insufficient documentation

## 2021-11-25 DIAGNOSIS — R112 Nausea with vomiting, unspecified: Secondary | ICD-10-CM | POA: Diagnosis present

## 2021-11-25 DIAGNOSIS — Z79899 Other long term (current) drug therapy: Secondary | ICD-10-CM | POA: Insufficient documentation

## 2021-11-25 DIAGNOSIS — N189 Chronic kidney disease, unspecified: Secondary | ICD-10-CM | POA: Diagnosis not present

## 2021-11-25 LAB — URINALYSIS, ROUTINE W REFLEX MICROSCOPIC
Bacteria, UA: NONE SEEN
Bilirubin Urine: NEGATIVE
Glucose, UA: >=500 mg/dL — AB
Ketones, ur: 80 mg/dL — AB
Leukocytes,Ua: NEGATIVE
Nitrite: NEGATIVE
Protein, ur: NEGATIVE mg/dL
Specific Gravity, Urine: 1.031 — ABNORMAL HIGH (ref 1.005–1.030)
pH: 5 (ref 5.0–8.0)

## 2021-11-25 LAB — BASIC METABOLIC PANEL
Anion gap: 10 (ref 5–15)
BUN: 21 mg/dL — ABNORMAL HIGH (ref 6–20)
CO2: 21 mmol/L — ABNORMAL LOW (ref 22–32)
Calcium: 9 mg/dL (ref 8.9–10.3)
Chloride: 106 mmol/L (ref 98–111)
Creatinine, Ser: 1.5 mg/dL — ABNORMAL HIGH (ref 0.61–1.24)
GFR, Estimated: 57 mL/min — ABNORMAL LOW (ref >60)
Glucose, Bld: 202 mg/dL — ABNORMAL HIGH (ref 70–99)
Potassium: 4.7 mmol/L (ref 3.5–5.1)
Sodium: 137 mmol/L (ref 135–145)

## 2021-11-25 LAB — CBC
HCT: 42.9 % (ref 39.0–52.0)
Hemoglobin: 14.9 g/dL (ref 13.0–17.0)
MCH: 30 pg (ref 26.0–34.0)
MCHC: 34.7 g/dL (ref 30.0–36.0)
MCV: 86.5 fL (ref 80.0–100.0)
Platelets: 271 10*3/uL (ref 150–400)
RBC: 4.96 MIL/uL (ref 4.22–5.81)
RDW: 11.9 % (ref 11.5–15.5)
WBC: 13.8 10*3/uL — ABNORMAL HIGH (ref 4.0–10.5)
nRBC: 0 % (ref 0.0–0.2)

## 2021-11-25 MED ORDER — HYDROMORPHONE HCL 1 MG/ML IJ SOLN
1.0000 mg | Freq: Once | INTRAMUSCULAR | Status: AC
  Administered 2021-11-25: 09:00:00 1 mg via INTRAVENOUS
  Filled 2021-11-25: qty 1

## 2021-11-25 MED ORDER — LACTATED RINGERS IV BOLUS
1000.0000 mL | Freq: Once | INTRAVENOUS | Status: AC
  Administered 2021-11-25: 09:00:00 1000 mL via INTRAVENOUS

## 2021-11-25 MED ORDER — ONDANSETRON HCL 4 MG PO TABS
4.0000 mg | ORAL_TABLET | Freq: Three times a day (TID) | ORAL | 0 refills | Status: DC | PRN
  Filled 2021-11-25: qty 10, 4d supply, fill #0

## 2021-11-25 MED ORDER — OXYCODONE-ACETAMINOPHEN 5-325 MG PO TABS
1.0000 | ORAL_TABLET | Freq: Three times a day (TID) | ORAL | 0 refills | Status: AC | PRN
  Filled 2021-11-25: qty 15, 5d supply, fill #0

## 2021-11-25 MED ORDER — TAMSULOSIN HCL 0.4 MG PO CAPS
0.4000 mg | ORAL_CAPSULE | Freq: Every day | ORAL | 0 refills | Status: AC
  Filled 2021-11-25: qty 5, 5d supply, fill #0

## 2021-11-25 MED ORDER — OXYCODONE-ACETAMINOPHEN 5-325 MG PO TABS
1.0000 | ORAL_TABLET | Freq: Once | ORAL | Status: AC
  Administered 2021-11-25: 11:00:00 1 via ORAL
  Filled 2021-11-25: qty 1

## 2021-11-25 NOTE — ED Provider Notes (Signed)
[facility] Emergency Department Provider Note  ____________________________________________   Event Date/Time   First MD Initiated Contact with Patient 11/25/21 [phone]     (approximate)  I have reviewed the triage vital signs and the nursing notes.   HISTORY  Chief Complaint Flank Pain   HPI Wayne Flowers is a 45 y.o. male with below noted past medical history who presents for assessment of approximately 1 week of some left lower back pain rating around the left flank towards the left side of the groin.  He has had some intermittent nausea but not had any vomiting, gross blood in his urine, burning with urination, abdominal pain, other back pain, chest pain, cough, shortness of breath, fevers or other clear acute associated sick symptoms.  He has been taking OTC pain medications without much improvement.  He thought he may have pulled a muscle as he was doing some yard work when it first began it also says it feels like prior kidney stone pain he has had a past.  No other acute concerns at this time.         Past Medical History:  Diagnosis Date   Allergy    Atelectasis    Chronic kidney disease    KIDNEY STONE   ED (erectile dysfunction)    ED (erectile dysfunction)    GERD (gastroesophageal reflux disease)    Hearing loss    right   History of renal calculi 02/19/2013   Hypertension    Kidney stone 05/10/2448   Metabolic syndrome    Morbid obesity ([place]) 09/08/2017   BMI >35 plus diabetes   Obesity, Class I, BMI 30.0-34.9 (see actual BMI)    Pleurisy    PONV (postoperative nausea and vomiting)    Sleep apnea    HAS NOT USED CPAP IN 6 MONTHS   Type 2 diabetes mellitus ([place]) 06/23/2016    Patient Active Problem List   Diagnosis Date Noted   Uncontrolled type 2 diabetes mellitus with hyperglycemia ([place]) 08/13/2020   Polyp of descending colon    History of small bowel obstruction 01/25/2020   Class 2 severe obesity with serious  comorbidity and body mass index (BMI) of 35.0 to 35.9 in adult ([place]) 09/08/2017   Hearing loss 03/11/2017   Hyperlipidemia LDL goal <70 07/19/2016   Allergic rhinitis with postnasal drip 12/22/2015   OSA (obstructive sleep apnea) 06/16/2015   Hypertension goal BP (blood pressure) < 140/90 06/16/2015   Gastroesophageal reflux disease 06/16/2015   Stiffness of right shoulder joint 06/16/2015   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   Low libido 08/22/2013    Past Surgical History:  Procedure Laterality Date   COLONOSCOPY WITH PROPOFOL N/A 04/21/2020   Procedure: COLONOSCOPY WITH PROPOFOL;  Surgeon: Lucilla Lame, MD;  Location: [place];  Service: Endoscopy;  Laterality: N/A;   CYSTOSCOPY W/ RETROGRADES Left 09/16/2015   Procedure: CYSTOSCOPY WITH RETROGRADE PYELOGRAM;  Surgeon: Royston Cowper, MD;  Location: [facility];  Service: Urology;  Laterality: Left;   CYSTOSCOPY WITH URETEROSCOPY AND STENT PLACEMENT     EXTRACORPOREAL SHOCK WAVE LITHOTRIPSY Left 03/26/2021   Procedure: EXTRACORPOREAL SHOCK WAVE LITHOTRIPSY (ESWL);  Surgeon: Royston Cowper, MD;  Location: [facility];  Service: Urology;  Laterality: Left;   POLYPECTOMY  04/21/2020   Procedure: POLYPECTOMY;  Surgeon: Lucilla Lame, MD;  Location: [place];  Service: Endoscopy;;   URETEROSCOPY WITH HOLMIUM LASER LITHOTRIPSY Left 09/16/2015   Procedure: URETEROSCOPY WITH HOLMIUM LASER LITHOTRIPSY;  Surgeon: Royston Cowper, MD;  Location: [facility];  Service: Urology;  Laterality: Left;   VASECTOMY      Prior to Admission medications   Medication Sig Start Date End Date Taking? Authorizing Provider  ondansetron (ZOFRAN) 4 MG tablet Take 1 tablet (4 mg total) by mouth every 8 (eight) hours as needed for up to 10 doses for nausea or vomiting. 11/25/21  Yes Lucrezia Starch, MD  oxyCODONE-acetaminophen (PERCOCET) 5-325 MG tablet Take 1 tablet by mouth every 8 (eight) hours as needed for up to 5 days for  severe pain. 11/25/21 11/30/21 Yes Lucrezia Starch, MD  tamsulosin (FLOMAX) 0.4 MG CAPS capsule Take 1 capsule (0.4 mg total) by mouth daily for 5 days. 11/25/21 11/30/21 Yes Lucrezia Starch, MD  atorvastatin (LIPITOR) 40 MG tablet TAKE 1 TABLET BY MOUTH AT BEDTIME 01/08/21 01/08/22  Delsa Grana, PA-C  blood glucose meter kit and supplies KIT Dispense based on patient and insurance preference. Use once daily for fasting blood sugar monitoring for Dx T2DM uncontrolled, ICD 10-E11.65 08/13/20   Delsa Grana, PA-C  Continuous Blood Gluc Sensor (FREESTYLE LIBRE 3 SENSOR) MISC 1 each by Does not apply route every 14 (fourteen) days. Place 1 sensor on the skin every 14 days. Use to check glucose continuously 11/18/21   Steele Sizer, MD  dicyclomine (BENTYL) 20 MG tablet Take 1 tablet (20 mg total) by mouth 3 (three) times daily as needed for spasms (abd cramping). 11/12/19   Delsa Grana, PA-C  Dulaglutide (TRULICITY) 1.5 GX/2.1JH SOPN INJECT 1.5 MG INTO THE SKIN ONCE A WEEK. 10/19/21 10/19/22  Delsa Grana, PA-C  empagliflozin (JARDIANCE) 25 MG TABS tablet Take 1 tablet (25 mg total) by mouth daily before breakfast. 11/18/21   Ancil Boozer, Drue Stager, MD  famotidine (PEPCID) 20 MG tablet TAKE 1 TABLET BY MOUTH 2 TIMES DAILY AS NEEDED FOR HEARTBURN OR INDIGESTION. 05/12/21 05/12/22  Delsa Grana, PA-C  fluticasone (FLONASE) 50 MCG/ACT nasal spray USE 2 SPRAYS INTO EACH NOSTRIL ONCE DAILY 11/26/20 11/26/21  Delsa Grana, PA-C  lisinopril (ZESTRIL) 10 MG tablet TAKE 1 TABLET (10 MG TOTAL) BY MOUTH DAILY. 01/08/21 01/08/22  Delsa Grana, PA-C  sildenafil (VIAGRA) 50 MG tablet Take 1-2 tablets (50-100 mg total) by mouth daily as needed for erectile dysfunction (30 mins prior to sexual activity). 02/18/21   Delsa Grana, PA-C    Allergies Omeprazole and Metformin and related  Family History  Problem Relation Age of Onset   Diabetes Mother    Cancer Father        renal   Spina bifida Sister    Appendicitis Son     Cancer Maternal Grandfather        black lung   Cancer Paternal Grandmother        lung   Colon cancer Paternal Grandmother    Diabetes Sister     Social History Social History   Tobacco Use   Smoking status: Never   Smokeless tobacco: Never  Vaping Use   Vaping Use: Never used  Substance Use Topics   Alcohol use: Yes    Alcohol/week: 0.0 - 1.0 standard drinks    Comment: rarley   Drug use: No    Review of Systems  Review of Systems  Constitutional:  Negative for chills and fever.  HENT:  Negative for sore throat.   Eyes:  Negative for pain.  Respiratory:  Negative for cough and stridor.   Cardiovascular:  Negative for chest pain.  Gastrointestinal:  Positive for  nausea. Negative for vomiting.  Musculoskeletal:  Positive for back pain.  Skin:  Negative for rash.  Neurological:  Negative for seizures, loss of consciousness and headaches.  Psychiatric/Behavioral:  Negative for suicidal ideas.   All other systems reviewed and are negative.    ____________________________________________   PHYSICAL EXAM:  VITAL SIGNS: ED Triage Vitals  Enc Vitals Group     BP 11/25/21 0504 (!) 142/86     Pulse Rate 11/25/21 0504 92     Resp 11/25/21 0504 20     Temp 11/25/21 0504 98.3 F (36.8 C)     Temp Source 11/25/21 0504 Oral     SpO2 11/25/21 0504 96 %     Weight 11/25/21 0509 238 lb (108 kg)     Height 11/25/21 0509 5' 9"  (1.753 m)     Head Circumference --      Peak Flow --      Pain Score 11/25/21 0509 9     Pain Loc --      Pain Edu? --      Excl. in [place]? --    Vitals:   11/25/21 0504 11/25/21 0802  BP: (!) 142/86 127/80  Pulse: 92 86  Resp: 20 18  Temp: 98.3 F (36.8 C) 98.6 F (37 C)  SpO2: 96% 97%   Physical Exam Vitals and nursing note reviewed.  Constitutional:      General: He is not in acute distress.    Appearance: He is well-developed.  HENT:     Head: Normocephalic and atraumatic.     Right Ear: External ear normal.     Left Ear: External  ear normal.     Nose: Nose normal.     Mouth/Throat:     Mouth: Mucous membranes are dry.  Eyes:     Conjunctiva/sclera: Conjunctivae normal.  Cardiovascular:     Rate and Rhythm: Normal rate and regular rhythm.     Heart sounds: No murmur heard. Pulmonary:     Effort: Pulmonary effort is normal. No respiratory distress.     Breath sounds: Normal breath sounds.  Abdominal:     Palpations: Abdomen is soft.     Tenderness: There is no abdominal tenderness. There is no right CVA tenderness or left CVA tenderness.  Musculoskeletal:        General: No swelling.     Cervical back: Neck supple.  Skin:    General: Skin is warm and dry.     Capillary Refill: Capillary refill takes less than 2 seconds.  Neurological:     Mental Status: He is alert.  Psychiatric:        Mood and Affect: Mood normal.     ____________________________________________   LABS (all labs ordered are listed, but only abnormal results are displayed)  Labs Reviewed  URINALYSIS, ROUTINE W REFLEX MICROSCOPIC - Abnormal; Notable for the following components:      Result Value   Color, Urine STRAW (*)    APPearance CLEAR (*)    Specific Gravity, Urine 1.031 (*)    Glucose, UA >=500 (*)    Hgb urine dipstick LARGE (*)    Ketones, ur 80 (*)    All other components within normal limits  BASIC METABOLIC PANEL - Abnormal; Notable for the following components:   CO2 21 (*)    Glucose, Bld 202 (*)    BUN 21 (*)    Creatinine, Ser 1.50 (*)    GFR, Estimated 57 (*)    All other components  within normal limits  CBC - Abnormal; Notable for the following components:   WBC 13.8 (*)    All other components within normal limits   ____________________________________________  EKG  ____________________________________________  RADIOLOGY  ED MD interpretation: CT on pelvis shows obstructing 5 mm distal left pelvic ureteral stone with mild to moderate hydronephrosis process and nonobstructing stones in both kidneys.   There is also evidence of chronic steatosis but no evidence of diverticulitis, pancreatitis, cholecystitis appendicitis or other acute abdominal pelvic process.  Official radiology report(s): CT Renal Stone Study  Result Date: 11/25/2021 CLINICAL DATA:  Left flank pain for several days. History of nephrolithiasis. EXAM: CT ABDOMEN AND PELVIS WITHOUT CONTRAST TECHNIQUE: Multidetector CT imaging of the abdomen and pelvis was performed following the standard protocol without IV contrast. COMPARISON:  None. FINDINGS: Lower chest: Mild hypoventilatory changes at the dependent lung bases bilaterally. Hepatobiliary: Diffuse hepatic steatosis. Normal liver size. No definite liver surface irregularity. Simple 1.2 cm superior liver cyst. Otherwise no liver masses. Normal gallbladder with no radiopaque cholelithiasis. No biliary ductal dilatation. Pancreas: Normal, with no mass or duct dilation. Spleen: Normal size. No mass. Adrenals/Urinary Tract: Normal adrenals. Obstructing 5 mm left pelvic ureteral stone located approximately 1.5 cm above the left ureterovesical junction, with mild to moderate left hydroureteronephrosis. No additional ureteral stones. Nonobstructing 3 mm interpolar left renal stone. Nonobstructing 1 mm lower right renal stone. No right hydronephrosis. Normal caliber right ureter. No contour deforming renal masses. Normal bladder. Stomach/Bowel: Normal non-distended stomach. Normal caliber small bowel with no small bowel wall thickening. Normal appendix. Normal large bowel with no diverticulosis, large bowel wall thickening or pericolonic fat stranding. Vascular/Lymphatic: Normal caliber abdominal aorta. No pathologically enlarged lymph nodes in the abdomen or pelvis. Reproductive: Normal size prostate. Other: No pneumoperitoneum, ascites or focal fluid collection. Musculoskeletal: No aggressive appearing focal osseous lesions. Mild thoracolumbar spondylosis. IMPRESSION: 1. Obstructing 5 mm distal  left pelvic ureteral stone, with mild to moderate left hydroureteronephrosis. 2. Additional nonobstructing stones in both kidneys. 3. Diffuse hepatic steatosis. Electronically Signed   By: Ilona Sorrel M.D.   On: 11/25/2021 10:01    ____________________________________________   PROCEDURES  Procedure(s) performed (including Critical Care):  Procedures   ____________________________________________   INITIAL IMPRESSION / ASSESSMENT AND PLAN / ED COURSE      Patient presents with above-stated history exam for assessment of approximately 1 week of some colicky left lower back pain similar to kidney stone pain he has had in the past.  He also notes that he had been doing some heavy lifting before onset and is concerned he may have strained a muscle.  On arrival he is afebrile and hemodynamically stable.  No significant CVA tenderness and abdomen is soft.  No rashes to suggest shingles.  Differential considerations include kidney stone, pyonephritis, diverticulitis.  BMP shows glucose of 202 and a creatinine of 1.5.  Creatinine of 0.95 was 2 months ago.  No other new electrolyte or metabolic derangements.  CBC shows WBC count of 13.8 and no evidence of acute anemia.  Urine has some ketones and blood as well as.  500 glucose but no evidence of infection.  CT on pelvis shows obstructing 5 mm distal left pelvic ureteral stone with mild to moderate hydronephrosis process and nonobstructing stones in both kidneys.  There is also evidence of chronic steatosis but no evidence of diverticulitis, pancreatitis, cholecystitis appendicitis or other acute abdominal pelvic process.  Patient states this pain feels little better on my reassessment although is not  completely gone.  I discussed with on-call urologist Dr. Yves Dill who recommended patient come over to his clinic to be seen in the next 2 hours.  Given I do believe patient is infected or septic he has been hydrated for his mild AKI I think this is  reasonable.  Explained this plan to patient.  We will also write Rx for analgesia antiemetics for what ever reason he is unable to get to urology later today.  Discharged in stable condition.  Strict return precautions advised and discussed.     ____________________________________________   FINAL CLINICAL IMPRESSION(S) / ED DIAGNOSES  Final diagnoses:  Kidney stone  AKI (acute kidney injury) ([place])    Medications  oxyCODONE-acetaminophen (PERCOCET/ROXICET) 5-325 MG per tablet 1 tablet (has no administration in time range)  lactated ringers bolus 1,000 mL (1,000 mLs Intravenous New Bag/Given 11/25/21 0855)  HYDROmorphone (DILAUDID) injection 1 mg (1 mg Intravenous Given 11/25/21 0853)     ED Discharge Orders          Ordered    ondansetron (ZOFRAN) 4 MG tablet  Every 8 hours PRN        11/25/21 1108    oxyCODONE-acetaminophen (PERCOCET) 5-325 MG tablet  Every 8 hours PRN        11/25/21 1108    tamsulosin (FLOMAX) 0.4 MG CAPS capsule  Daily        11/25/21 1108             Note:  This document was prepared using Dragon voice recognition software and may include unintentional dictation errors.    Lucrezia Starch, MD 11/25/21 [phone]

## 2021-11-25 NOTE — ED Notes (Signed)
Pt asking for pain meds, offered tylenol, pt states he has been taking tylenol and ibuprofen with no relief.  Informed pt I would discuss with EDP once labs were back. Pt verbalized understanding.

## 2021-11-25 NOTE — ED Triage Notes (Signed)
Pt arrived via POV with reports of L flank pain since 12/23, pt states he thinks maybe he pulled muscle because he had been working in the yard, however, pt states the pain feels similar to kidney stone and has had some nausea.

## 2021-11-26 ENCOUNTER — Ambulatory Visit: Admission: RE | Admit: 2021-11-26 | Payer: 59 | Source: Home / Self Care | Admitting: Urology

## 2021-11-26 ENCOUNTER — Ambulatory Visit: Payer: 59 | Attending: Internal Medicine

## 2021-11-26 ENCOUNTER — Other Ambulatory Visit: Payer: Self-pay

## 2021-11-26 ENCOUNTER — Encounter: Admission: RE | Payer: Self-pay | Source: Home / Self Care

## 2021-11-26 DIAGNOSIS — Z23 Encounter for immunization: Secondary | ICD-10-CM

## 2021-11-26 SURGERY — LITHOTRIPSY, ESWL
Anesthesia: Choice | Laterality: Left

## 2021-11-26 MED ORDER — PFIZER COVID-19 VAC BIVALENT 30 MCG/0.3ML IM SUSP
INTRAMUSCULAR | 0 refills | Status: DC
  Filled 2021-11-26: qty 0.3, 1d supply, fill #0

## 2021-11-26 MED FILL — Lisinopril Tab 10 MG: ORAL | 30 days supply | Qty: 30 | Fill #8 | Status: AC

## 2021-11-26 MED FILL — Atorvastatin Calcium Tab 40 MG (Base Equivalent): ORAL | 30 days supply | Qty: 30 | Fill #7 | Status: AC

## 2021-11-26 NOTE — Progress Notes (Signed)
° °  Covid-19 Vaccination Clinic  Name:  Owin Vignola    MRN: [id number] DOB: 1976-04-26  11/26/2021  Mr. Kramp was observed post Covid-19 immunization for 15 minutes without incident. He was provided with Vaccine Information Sheet and instruction to access the V-Safe system.   Mr. Wierzbicki was instructed to call 911 with any severe reactions post vaccine: Difficulty breathing  Swelling of face and throat  A fast heartbeat  A bad rash all over body  Dizziness and weakness   Immunizations Administered     Name Date Dose VIS Date Route   Pfizer Covid-19 Vaccine Bivalent Booster 11/26/2021 12:08 PM 0.3 mL 07/29/2021 Intramuscular   Manufacturer: [place]   Lot: [id number]   [place]: [phone]

## 2021-12-04 ENCOUNTER — Ambulatory Visit: Payer: 59 | Admitting: Internal Medicine

## 2021-12-16 ENCOUNTER — Other Ambulatory Visit: Payer: Self-pay

## 2021-12-30 ENCOUNTER — Other Ambulatory Visit: Payer: Self-pay

## 2021-12-30 MED FILL — Lisinopril Tab 10 MG: ORAL | 30 days supply | Qty: 30 | Fill #9 | Status: AC

## 2021-12-30 MED FILL — Atorvastatin Calcium Tab 40 MG (Base Equivalent): ORAL | 30 days supply | Qty: 30 | Fill #8 | Status: AC

## 2022-01-12 ENCOUNTER — Other Ambulatory Visit: Payer: Self-pay

## 2022-01-25 ENCOUNTER — Other Ambulatory Visit: Payer: Self-pay

## 2022-01-26 ENCOUNTER — Other Ambulatory Visit: Payer: Self-pay

## 2022-01-31 ENCOUNTER — Other Ambulatory Visit: Payer: Self-pay | Admitting: Family Medicine

## 2022-01-31 DIAGNOSIS — I10 Essential (primary) hypertension: Secondary | ICD-10-CM

## 2022-01-31 DIAGNOSIS — E785 Hyperlipidemia, unspecified: Secondary | ICD-10-CM

## 2022-02-01 ENCOUNTER — Other Ambulatory Visit: Payer: Self-pay

## 2022-02-01 MED ORDER — LISINOPRIL 10 MG PO TABS
ORAL_TABLET | Freq: Every day | ORAL | 0 refills | Status: DC
  Filled 2022-02-01: qty 30, 30d supply, fill #0

## 2022-02-01 MED ORDER — ATORVASTATIN CALCIUM 40 MG PO TABS
ORAL_TABLET | Freq: Every day | ORAL | 0 refills | Status: DC
  Filled 2022-02-01: qty 30, 30d supply, fill #0

## 2022-02-15 NOTE — Progress Notes (Signed)
Name: Wayne Flowers   MRN: [id number]    DOB: [birth year]   Date:02/16/2022 ? ?     Progress Note ? ?Subjective ? ?Chief Complaint ? ?Follow Up ? ?HPI ? ?DM type II: he was diagnosed about 10 years ago, he has tried Metformin but caused diarrhea. He is currently on trulicity 1.5 mg , we tried [place] but he stopped due to increase in urinary frequency. He has changed his diet and going to the gym since he started to use FreeStyle libre , his estimated A1C is now 7 % down from 8.8 %. 75 % in range 75/180 . He denies  polyphagia or polydipsia, he still has some polyuria . His  urine micro negative. LDL at goal , he has ED and needs refill of viagra  ? ?Dyslipidemia: we will continue Atorvastatin, recheck levels next visit  ? ?GERD: doing well on Pepcid, also change his diet and is doing well.  ? ?Fatty liver : eating healthier, losing weight, we will recheck liver enzymes ? ?History of kidney stones: passed one in Dec, WBC was high, kidney function low and we will recheck labs today  ? ?OSA: not wearing CPAP., he could not tolerate it  ? ?Patient Active Problem List  ? Diagnosis Date Noted  ? Uncontrolled type 2 diabetes mellitus with hyperglycemia ([place]) 08/13/2020  ? Polyp of descending colon   ? History of small bowel obstruction 01/25/2020  ? Class 2 severe obesity with serious comorbidity and body mass index (BMI) of 35.0 to 35.9 in adult [facility]) 09/08/2017  ? Hearing loss 03/11/2017  ? Hyperlipidemia LDL goal <70 07/19/2016  ? Allergic rhinitis with postnasal drip 12/22/2015  ? OSA (obstructive sleep apnea) 06/16/2015  ? Hypertension goal BP (blood pressure) < 140/90 06/16/2015  ? Gastroesophageal reflux disease 06/16/2015  ? Stiffness of right shoulder joint 06/16/2015  ? 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 Low libido 08/22/2013  ? ? ?Past Surgical History:  ?Procedure Laterality Date  ? COLONOSCOPY WITH PROPOFOL N/A 04/21/2020  ? Procedure: COLONOSCOPY WITH PROPOFOL;  Surgeon: Lucilla Lame, MD;  Location: [place];  Service: Endoscopy;  Laterality: N/A;  ? CYSTOSCOPY W/ RETROGRADES Left 09/16/2015  ? Procedure: CYSTOSCOPY WITH RETROGRADE PYELOGRAM;  Surgeon: Royston Cowper, MD;  Location: [facility];  Service: Urology;  Laterality: Left;  ? CYSTOSCOPY WITH URETEROSCOPY AND STENT PLACEMENT    ? EXTRACORPOREAL SHOCK WAVE LITHOTRIPSY Left 03/26/2021  ? Procedure: EXTRACORPOREAL SHOCK WAVE LITHOTRIPSY (ESWL);  Surgeon: Royston Cowper, MD;  Location: [facility];  Service: Urology;  Laterality: Left;  ? POLYPECTOMY  04/21/2020  ? Procedure: POLYPECTOMY;  Surgeon: Lucilla Lame, MD;  Location: [place];  Service: Endoscopy;;  ? URETEROSCOPY WITH HOLMIUM LASER LITHOTRIPSY Left 09/16/2015  ? Procedure: URETEROSCOPY WITH HOLMIUM LASER LITHOTRIPSY;  Surgeon: Royston Cowper, MD;  Location: [facility];  Service: Urology;  Laterality: Left;  ? VASECTOMY    ? ? ?Family History  ?Problem Relation Age of Onset  ? Diabetes Mother   ? Cancer Father   ?     renal  ? Spina bifida Sister   ? Appendicitis Son   ? Cancer Maternal Grandfather   ?     black lung  ? Cancer Paternal Grandmother   ?     lung  ? Colon cancer Paternal Grandmother   ? Diabetes Sister   ? ? ?Social History  ? ?Tobacco Use  ? Smoking status: Never  ? Smokeless tobacco:  Never  ?Substance Use Topics  ? Alcohol use: Yes  ?  Alcohol/week: 0.0 - 1.0 standard drinks  ?  Comment: rarley  ? ? ? ?Current Outpatient Medications:  ?  atorvastatin (LIPITOR) 40 MG tablet, TAKE 1 TABLET BY MOUTH AT BEDTIME, Disp: 30 tablet, Rfl: 0 ?  blood glucose meter kit and supplies KIT, Dispense based on patient and insurance preference. Use once daily for fasting blood sugar monitoring for Dx T2DM uncontrolled, ICD 10-E11.65, Disp: 1 each, Rfl: 0 ?  Continuous Blood Gluc Sensor (FREESTYLE LIBRE 3 SENSOR) MISC, 1 each by Does not apply route every 14 (fourteen) days. Place 1 sensor on the skin every 14 days. Use to check glucose continuously, Disp: 6  each, Rfl: 1 ?  COVID-19 mRNA bivalent vaccine, Pfizer, (PFIZER COVID-19 VAC BIVALENT) injection, Inject into the muscle., Disp: 0.3 mL, Rfl: 0 ?  dicyclomine (BENTYL) 20 MG tablet, Take 1 tablet (20 mg total) by mouth 3 (three) times daily as needed for spasms (abd cramping)., Disp: 60 tablet, Rfl: 1 ?  Dulaglutide (TRULICITY) 1.5 ZO/1.0RU SOPN, INJECT 1.5 MG INTO THE SKIN ONCE A WEEK., Disp: 6 mL, Rfl: 3 ?  empagliflozin (JARDIANCE) 25 MG TABS tablet, Take 1 tablet (25 mg total) by mouth daily before breakfast., Disp: 90 tablet, Rfl: 1 ?  famotidine (PEPCID) 20 MG tablet, TAKE 1 TABLET BY MOUTH 2 TIMES DAILY AS NEEDED FOR HEARTBURN OR INDIGESTION., Disp: 180 tablet, Rfl: 3 ?  lisinopril (ZESTRIL) 10 MG tablet, TAKE 1 TABLET (10 MG TOTAL) BY MOUTH DAILY., Disp: 30 tablet, Rfl: 0 ?  ondansetron (ZOFRAN) 4 MG tablet, Take 1 tablet (4 mg total) by mouth every 8 (eight) hours as needed for up to 10 doses for nausea or vomiting., Disp: 10 tablet, Rfl: 0 ?  sildenafil (VIAGRA) 50 MG tablet, Take 1-2 tablets (50-100 mg total) by mouth daily as needed for erectile dysfunction (30 mins prior to sexual activity)., Disp: 30 tablet, Rfl: 3 ?  fluticasone (FLONASE) 50 MCG/ACT nasal spray, USE 2 SPRAYS INTO EACH NOSTRIL ONCE DAILY, Disp: 16 g, Rfl: 3 ? ?Allergies  ?Allergen Reactions  ? Omeprazole Other (See Comments)  ?  Reaction:  Memory loss   ? Metformin And Related Diarrhea  ? ? ?I personally reviewed active problem list, medication list, allergies, family history, social history, health maintenance with the patient/caregiver today. ? ? ?ROS ? ?Constitutional: Negative for fever, positive for weight change.  ?Respiratory: Negative for cough and shortness of breath.   ?Cardiovascular: Negative for chest pain or palpitations.  ?Gastrointestinal: Negative for abdominal pain, no bowel changes.  ?Musculoskeletal: Negative for gait problem or joint swelling.  ?Skin: Negative for rash.  ?Neurological: Negative for dizziness or  headache.  ?No other specific complaints in a complete review of systems (except as listed in HPI above).  ? ?Objective ? ?Vitals:  ? 02/16/22 1451  ?BP: 132/70  ?Pulse: 85  ?Resp: 16  ?SpO2: 98%  ?Weight: 232 lb (105.2 kg)  ?Height: 5' 9"  (1.753 m)  ? ? ?Body mass index is 34.26 kg/m?. ? ?Physical Exam ? ?Constitutional: Patient appears well-developed and well-nourished. Obese  No distress.  ?HEENT: head atraumatic, normocephalic, pupils equal and reactive to light, neck supple ?Cardiovascular: Normal rate, regular rhythm and normal heart sounds.  No murmur heard. No BLE edema. ?Pulmonary/Chest: Effort normal and breath sounds normal. No respiratory distress. ?Abdominal: Soft.  There is no tenderness. ?Psychiatric: Patient has a normal mood and affect. behavior is normal. Judgment and thought  content normal.  ? ?Recent Results (from the past 2160 hour(s))  ?POCT HgB A1C     Status: Abnormal  ? Collection Time: 11/18/21  3:28 PM  ?Result Value Ref Range  ? Hemoglobin A1C 8.8 (A) 4.0 - 5.6 %  ? HbA1c POC (<> result, manual entry)    ? HbA1c, POC (prediabetic range)    ? HbA1c, POC (controlled diabetic range)    ?Urinalysis, Routine w reflex microscopic     Status: Abnormal  ? Collection Time: 11/25/21  5:11 AM  ?Result Value Ref Range  ? Color, Urine STRAW (A) YELLOW  ? APPearance CLEAR (A) CLEAR  ? Specific Gravity, Urine 1.031 (H) 1.005 - 1.030  ? pH 5.0 5.0 - 8.0  ? Glucose, UA >=500 (A) NEGATIVE mg/dL  ? Hgb urine dipstick LARGE (A) NEGATIVE  ? Bilirubin Urine NEGATIVE NEGATIVE  ? Ketones, ur 80 (A) NEGATIVE mg/dL  ? Protein, ur NEGATIVE NEGATIVE mg/dL  ? Nitrite NEGATIVE NEGATIVE  ? Leukocytes,Ua NEGATIVE NEGATIVE  ? RBC / HPF 0-5 0 - 5 RBC/hpf  ? WBC, UA 0-5 0 - 5 WBC/hpf  ? Bacteria, UA NONE SEEN NONE SEEN  ? Squamous Epithelial / LPF 0-5 0 - 5  ? Mucus PRESENT   ?  Comment: Performed at [facility], [address] [place], [place] [address]  ?Basic metabolic panel     Status: Abnormal  ?  Collection Time: 11/25/21  5:11 AM  ?Result Value Ref Range  ? Sodium 137 135 - 145 mmol/L  ? Potassium 4.7 3.5 - 5.1 mmol/L  ? Chloride 106 98 - 111 mmol/L  ? CO2 21 (L) 22 - 32 mmol/L  ? Glucose, Bld 202 (H) 70 -

## 2022-02-16 ENCOUNTER — Encounter: Payer: Self-pay | Admitting: Family Medicine

## 2022-02-16 ENCOUNTER — Ambulatory Visit: Payer: 59 | Admitting: Family Medicine

## 2022-02-16 ENCOUNTER — Other Ambulatory Visit: Payer: Self-pay

## 2022-02-16 VITALS — BP 132/70 | HR 85 | Resp 16 | Ht 69.0 in | Wt 232.0 lb

## 2022-02-16 DIAGNOSIS — N529 Male erectile dysfunction, unspecified: Secondary | ICD-10-CM

## 2022-02-16 DIAGNOSIS — E1169 Type 2 diabetes mellitus with other specified complication: Secondary | ICD-10-CM | POA: Diagnosis not present

## 2022-02-16 DIAGNOSIS — K219 Gastro-esophageal reflux disease without esophagitis: Secondary | ICD-10-CM

## 2022-02-16 DIAGNOSIS — J309 Allergic rhinitis, unspecified: Secondary | ICD-10-CM

## 2022-02-16 DIAGNOSIS — E669 Obesity, unspecified: Secondary | ICD-10-CM | POA: Diagnosis not present

## 2022-02-16 DIAGNOSIS — E785 Hyperlipidemia, unspecified: Secondary | ICD-10-CM

## 2022-02-16 DIAGNOSIS — R0982 Postnasal drip: Secondary | ICD-10-CM

## 2022-02-16 DIAGNOSIS — D72829 Elevated white blood cell count, unspecified: Secondary | ICD-10-CM | POA: Diagnosis not present

## 2022-02-16 DIAGNOSIS — I10 Essential (primary) hypertension: Secondary | ICD-10-CM

## 2022-02-16 MED ORDER — ATORVASTATIN CALCIUM 40 MG PO TABS
ORAL_TABLET | Freq: Every day | ORAL | 1 refills | Status: DC
  Filled 2022-02-16: qty 90, 90d supply, fill #0
  Filled 2022-03-03: qty 30, 30d supply, fill #0
  Filled 2022-04-11: qty 30, 30d supply, fill #1
  Filled 2022-05-02 – 2022-05-03 (×2): qty 30, 30d supply, fill #2
  Filled 2022-06-07: qty 30, 30d supply, fill #3
  Filled 2022-07-21: qty 30, 30d supply, fill #4
  Filled 2022-08-20: qty 30, 30d supply, fill #5

## 2022-02-16 MED ORDER — FLUTICASONE PROPIONATE 50 MCG/ACT NA SUSP
NASAL | 3 refills | Status: DC
  Filled 2022-02-16: qty 16, 30d supply, fill #0
  Filled 2022-05-02: qty 16, 30d supply, fill #1
  Filled 2022-05-26: qty 16, 30d supply, fill #2
  Filled 2022-06-29: qty 16, 30d supply, fill #3

## 2022-02-16 MED ORDER — FREESTYLE LIBRE 3 SENSOR MISC
1.0000 | 1 refills | Status: DC
  Filled 2022-02-16 – 2022-05-02 (×2): qty 6, 84d supply, fill #0
  Filled 2022-07-21: qty 6, 84d supply, fill #1

## 2022-02-16 MED ORDER — SILDENAFIL CITRATE 50 MG PO TABS: 50.0000 mg | ORAL_TABLET | Freq: Every day | ORAL | 0 refills | Status: DC | PRN

## 2022-02-16 MED ORDER — TRULICITY 1.5 MG/0.5ML ~~LOC~~ SOAJ
1.5000 mg | SUBCUTANEOUS | 1 refills | Status: DC
  Filled 2022-02-16: qty 2, 28d supply, fill #0
  Filled 2022-03-24: qty 2, 28d supply, fill #1
  Filled 2022-05-02: qty 2, 28d supply, fill #2
  Filled 2022-06-02: qty 2, 28d supply, fill #3
  Filled 2022-06-24: qty 2, 28d supply, fill #4
  Filled 2022-07-21: qty 2, 28d supply, fill #5

## 2022-02-16 MED ORDER — LISINOPRIL 10 MG PO TABS
ORAL_TABLET | Freq: Every day | ORAL | 0 refills | Status: DC
  Filled 2022-02-16: qty 90, fill #0
  Filled 2022-03-03: qty 30, 30d supply, fill #0
  Filled 2022-04-01: qty 30, 30d supply, fill #1
  Filled 2022-05-02: qty 30, 30d supply, fill #2

## 2022-02-17 LAB — HEMOGLOBIN A1C
Hgb A1c MFr Bld: 7.3 % of total Hgb — ABNORMAL HIGH (ref <5.7)
Mean Plasma Glucose: 163 mg/dL
eAG (mmol/L): 9 mmol/L

## 2022-02-17 LAB — COMPLETE METABOLIC PANEL WITH GFR
AG Ratio: 1.8 (calc) (ref 1.0–2.5)
ALT: 37 U/L (ref 9–46)
AST: 19 U/L (ref 10–40)
Albumin: 4.4 g/dL (ref 3.6–5.1)
Alkaline phosphatase (APISO): 69 U/L (ref 36–130)
BUN: 17 mg/dL (ref 7–25)
CO2: 25 mmol/L (ref 20–32)
Calcium: 9.5 mg/dL (ref 8.6–10.3)
Chloride: 105 mmol/L (ref 98–110)
Creat: 1.01 mg/dL (ref 0.60–1.29)
Globulin: 2.5 g/dL (calc) (ref 1.9–3.7)
Glucose, Bld: 143 mg/dL — ABNORMAL HIGH (ref 65–99)
Potassium: 4.5 mmol/L (ref 3.5–5.3)
Sodium: 143 mmol/L (ref 135–146)
Total Bilirubin: 0.3 mg/dL (ref 0.2–1.2)
Total Protein: 6.9 g/dL (ref 6.1–8.1)
eGFR: 92 mL/min/{1.73_m2} (ref >=60)

## 2022-02-17 LAB — CBC WITH DIFFERENTIAL/PLATELET
Absolute Monocytes: 572 cells/uL (ref 200–950)
Basophils Absolute: 97 cells/uL (ref 0–200)
Basophils Relative: 1.1 %
Eosinophils Absolute: 264 cells/uL (ref 15–500)
Eosinophils Relative: 3 %
HCT: 44.5 % (ref 38.5–50.0)
Hemoglobin: 15 g/dL (ref 13.2–17.1)
Lymphs Abs: 2490 cells/uL (ref 850–3900)
MCH: 29.8 pg (ref 27.0–33.0)
MCHC: 33.7 g/dL (ref 32.0–36.0)
MCV: 88.3 fL (ref 80.0–100.0)
MPV: 10.4 fL (ref 7.5–12.5)
Monocytes Relative: 6.5 %
Neutro Abs: 5377 cells/uL (ref 1500–7800)
Neutrophils Relative %: 61.1 %
Platelets: 310 10*3/uL (ref 140–400)
RBC: 5.04 10*6/uL (ref 4.20–5.80)
RDW: 12.2 % (ref 11.0–15.0)
Total Lymphocyte: 28.3 %
WBC: 8.8 10*3/uL (ref 3.8–10.8)

## 2022-03-03 ENCOUNTER — Other Ambulatory Visit: Payer: Self-pay

## 2022-03-25 ENCOUNTER — Other Ambulatory Visit: Payer: Self-pay

## 2022-04-01 ENCOUNTER — Other Ambulatory Visit: Payer: Self-pay

## 2022-04-11 ENCOUNTER — Other Ambulatory Visit: Payer: Self-pay

## 2022-04-12 ENCOUNTER — Other Ambulatory Visit: Payer: Self-pay

## 2022-04-15 ENCOUNTER — Other Ambulatory Visit: Payer: Self-pay

## 2022-05-02 ENCOUNTER — Other Ambulatory Visit: Payer: Self-pay | Admitting: Family Medicine

## 2022-05-02 ENCOUNTER — Other Ambulatory Visit: Payer: Self-pay

## 2022-05-02 DIAGNOSIS — N529 Male erectile dysfunction, unspecified: Secondary | ICD-10-CM

## 2022-05-03 ENCOUNTER — Encounter: Payer: Self-pay | Admitting: Family Medicine

## 2022-05-03 ENCOUNTER — Other Ambulatory Visit: Payer: Self-pay

## 2022-05-03 MED ORDER — SILDENAFIL CITRATE 50 MG PO TABS: 50.0000 mg | ORAL_TABLET | Freq: Every day | ORAL | 0 refills | Status: DC | PRN

## 2022-05-04 ENCOUNTER — Other Ambulatory Visit: Payer: Self-pay

## 2022-05-13 ENCOUNTER — Telehealth: Payer: 59 | Admitting: Family Medicine

## 2022-05-13 ENCOUNTER — Other Ambulatory Visit: Payer: Self-pay

## 2022-05-13 ENCOUNTER — Ambulatory Visit: Payer: Self-pay | Admitting: *Deleted

## 2022-05-13 DIAGNOSIS — T300 Burn of unspecified body region, unspecified degree: Secondary | ICD-10-CM

## 2022-05-13 DIAGNOSIS — L089 Local infection of the skin and subcutaneous tissue, unspecified: Secondary | ICD-10-CM | POA: Diagnosis not present

## 2022-05-13 DIAGNOSIS — T22111A Burn of first degree of right forearm, initial encounter: Secondary | ICD-10-CM

## 2022-05-13 MED ORDER — DOXYCYCLINE HYCLATE 100 MG PO TABS
100.0000 mg | ORAL_TABLET | Freq: Two times a day (BID) | ORAL | 0 refills | Status: AC
  Filled 2022-05-13: qty 20, 10d supply, fill #0

## 2022-05-13 MED ORDER — SILVER SULFADIAZINE 1 % EX CREA
1.0000 | TOPICAL_CREAM | Freq: Every day | CUTANEOUS | 0 refills | Status: DC
Start: 2022-05-13 — End: 2022-05-25
  Filled 2022-05-13: qty 50, 7d supply, fill #0
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
## 2022-05-14 ENCOUNTER — Ambulatory Visit: Payer: 59 | Admitting: Internal Medicine

## 2022-05-23 ENCOUNTER — Other Ambulatory Visit: Payer: Self-pay | Admitting: Family Medicine

## 2022-05-23 DIAGNOSIS — K219 Gastro-esophageal reflux disease without esophagitis: Secondary | ICD-10-CM

## 2022-05-24 ENCOUNTER — Other Ambulatory Visit: Payer: Self-pay

## 2022-05-24 MED ORDER — FAMOTIDINE 20 MG PO TABS
ORAL_TABLET | ORAL | 3 refills | Status: DC
  Filled 2022-05-24: qty 160, 80d supply, fill #0
  Filled 2022-05-24: qty 20, 10d supply, fill #0
  Filled 2022-08-20: qty 180, 90d supply, fill #1
  Filled 2022-11-18: qty 180, 90d supply, fill #2

## 2022-05-24 NOTE — Progress Notes (Signed)
Name: Wayne Flowers   MRN: [id number]    DOB: [birth year]   Date:05/25/2022       Progress Note  Subjective  Chief Complaint  Follow Up  HPI  DM type II: he was diagnosed about 10 years ago, he has tried Metformin but caused diarrhea. He is currently on trulicity 1.5 mg , we tried [place] but he stopped due to increase in urinary frequency, A1C is very high again at 8.9 %, he has been travelling more for work, not going to the gym and eating out more often. He wants to resume a healthier diet, we will add Actos 15 and discussed possible side effects, he prefers not going up on dose of Trulicity at this time. Marland Kitchen He denies  polyphagia or polydipsia, he still has some polyuria . He has dyslipidemia and is on statin therapy, Viagra for ED  Dyslipidemia: we will continue Atorvastatin, since not as compiant with diet and also A1C is elevated we will wait until next visit to repeat lipid panel   GERD: doing well on Pepcid, he is avoiding spicy and red sauces and is controlling symptoms    Fatty liver : eating healthier, losing weight, last liver enzymes were normal, we will add Actos   OSA: not wearing CPAP., he could not tolerate it  Unchnaged   Patient Active Problem List   Diagnosis Date Noted   Polyp of descending colon    History of small bowel obstruction 01/25/2020   Hearing loss 03/11/2017   Hyperlipidemia LDL goal <70 07/19/2016   Allergic rhinitis with postnasal drip 12/22/2015   OSA (obstructive sleep apnea) 06/16/2015   Hypertension goal BP (blood pressure) < 140/90 06/16/2015   Gastroesophageal reflux disease 06/16/2015   Stiffness of right shoulder joint 06/16/2015   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   Low libido 08/22/2013    Past Surgical History:  Procedure Laterality Date   COLONOSCOPY WITH PROPOFOL N/A 04/21/2020   Procedure: COLONOSCOPY WITH PROPOFOL;  Surgeon: Midge Minium, MD;  Location[facility];  Service: Endoscopy;   Laterality: N/A;   CYSTOSCOPY W/ RETROGRADES Left 09/16/2015   Procedure: CYSTOSCOPY WITH RETROGRADE PYELOGRAM;  Surgeon: Orson Ape, MD;  Location: [facility];  Service: Urology;  Laterality: Left;   CYSTOSCOPY WITH URETEROSCOPY AND STENT PLACEMENT     EXTRACORPOREAL SHOCK WAVE LITHOTRIPSY Left 03/26/2021   Procedure: EXTRACORPOREAL SHOCK WAVE LITHOTRIPSY (ESWL);  Surgeon: Orson Ape, MD;  Location: [facility];  Service: Urology;  Laterality: Left;   POLYPECTOMY  04/21/2020   Procedure: POLYPECTOMY;  Surgeon: Midge Minium, MD;  Location: [facility];  Service: Endoscopy;;   URETEROSCOPY WITH HOLMIUM LASER LITHOTRIPSY Left 09/16/2015   Procedure: URETEROSCOPY WITH HOLMIUM LASER LITHOTRIPSY;  Surgeon: Orson Ape, MD;  Location: [facility];  Service: Urology;  Laterality: Left;   VASECTOMY      Family History  Problem Relation Age of Onset   Diabetes Mother    Cancer Father        renal   Spina bifida Sister    Appendicitis Son    Cancer Maternal Grandfather        black lung   Cancer Paternal Grandmother        lung   Colon cancer Paternal Grandmother    Diabetes Sister     Social History   Tobacco Use   Smoking status: Never   Smokeless tobacco: Never  Substance Use Topics   Alcohol use: Yes    Alcohol/week:  0.0 - 1.0 standard drinks of alcohol    Comment: rarley     Current Outpatient Medications:    atorvastatin (LIPITOR) 40 MG tablet, TAKE 1 TABLET BY MOUTH AT BEDTIME, Disp: 90 tablet, Rfl: 1   blood glucose meter kit and supplies KIT, Dispense based on patient and insurance preference. Use once daily for fasting blood sugar monitoring for Dx T2DM uncontrolled, ICD 10-E11.65, Disp: 1 each, Rfl: 0   Continuous Blood Gluc Sensor (FREESTYLE LIBRE 3 SENSOR) MISC, 1 each by Does not apply route every 14 (fourteen) days. Place 1 sensor on the skin every 14 days. Use to check glucose continuously, Disp: 6 each, Rfl: 1   dicyclomine (BENTYL) 20 MG tablet, Take 1  tablet (20 mg total) by mouth 3 (three) times daily as needed for spasms (abd cramping)., Disp: 60 tablet, Rfl: 1   Dulaglutide (TRULICITY) 1.5 MG/0.5ML SOPN, INJECT 1.5 MG INTO THE SKIN ONCE A WEEK., Disp: 6 mL, Rfl: 1   famotidine (PEPCID) 20 MG tablet, TAKE 1 TABLET BY MOUTH 2 TIMES DAILY AS NEEDED FOR HEARTBURN OR INDIGESTION., Disp: 180 tablet, Rfl: 3   fluticasone (FLONASE) 50 MCG/ACT nasal spray, USE 2 SPRAYS INTO EACH NOSTRIL ONCE DAILY, Disp: 16 g, Rfl: 3   lisinopril (ZESTRIL) 10 MG tablet, TAKE 1 TABLET (10 MG TOTAL) BY MOUTH DAILY., Disp: 90 tablet, Rfl: 0   sildenafil (VIAGRA) 50 MG tablet, Take 1-2 tablets (50-100 mg total) by mouth daily as needed for erectile dysfunction (30 mins prior to sexual activity)., Disp: 90 tablet, Rfl: 0  Allergies  Allergen Reactions   Omeprazole Other (See Comments)    Reaction:  Memory loss    Metformin And Related Diarrhea    I personally reviewed active problem list, medication list, allergies, family history, social history, health maintenance with the patient/caregiver today.   ROS  Constitutional: Negative for fever or weight change.  Respiratory: Negative for cough and shortness of breath.   Cardiovascular: Negative for chest pain or palpitations.  Gastrointestinal: Negative for abdominal pain, no bowel changes.  Musculoskeletal: Negative for gait problem or joint swelling.  Skin: Negative for rash.  Neurological: Negative for dizziness or headache.  No other specific complaints in a complete review of systems (except as listed in HPI above).   Objective  Vitals:   05/25/22 1427  BP: 128/72  Pulse: 89  Resp: 16  SpO2: 95%  Weight: 235 lb (106.6 kg)  Height: 5\' 9"  (1.753 m)    Body mass index is 34.7 kg/m.  Physical Exam  Constitutional: Patient appears well-developed and well-nourished. Obese  No distress.  HEENT: head atraumatic, normocephalic, pupils equal and reactive to light, neck supple Cardiovascular: Normal  rate, regular rhythm and normal heart sounds.  No murmur heard. No BLE edema. Pulmonary/Chest: Effort normal and breath sounds normal. No respiratory distress. Abdominal: Soft.  There is no tenderness. Psychiatric: Patient has a normal mood and affect. behavior is normal. Judgment and thought content normal.   Diabetic Foot Exam: Diabetic Foot Exam - Simple   Simple Foot Form Visual Inspection No deformities, no ulcerations, no other skin breakdown bilaterally: Yes Sensation Testing Intact to touch and monofilament testing bilaterally: Yes Pulse Check Posterior Tibialis and Dorsalis pulse intact bilaterally: Yes Comments      PHQ2/9:    05/25/2022    2:33 PM 02/16/2022    2:51 PM 11/18/2021    3:18 PM 05/12/2021    9:09 AM 01/08/2021    2:42 PM  Depression screen PHQ  2/9  Decreased Interest 0 0 0 0 0  Down, Depressed, Hopeless 0 0 0 0 0  PHQ - 2 Score 0 0 0 0 0  Altered sleeping  0 0 0 0  Tired, decreased energy  0 0 0 0  Change in appetite  0 0 0 0  Feeling bad or failure about yourself   0 0 0 0  Trouble concentrating  0 0 0 0  Moving slowly or fidgety/restless  0 0 0 0  Suicidal thoughts  0 0 0 0  PHQ-9 Score  0 0 0 0  Difficult doing work/chores    Not difficult at all Not difficult at all    phq 9 is negative   Fall Risk:    05/25/2022    2:33 PM 02/16/2022    2:51 PM 11/18/2021    3:18 PM 05/12/2021    9:09 AM 01/08/2021    2:41 PM  Fall Risk   Falls in the past year? 0 0 0 0 0  Number falls in past yr: 0 0 0 0 0  Injury with Fall? 0 0 0 0 0  Risk for fall due to : No Fall Risks No Fall Risks No Fall Risks    Follow up Falls prevention discussed Falls prevention discussed Falls prevention discussed        Functional Status Survey: Is the patient deaf or have difficulty hearing?: Yes Does the patient have difficulty seeing, even when wearing glasses/contacts?: No Does the patient have difficulty concentrating, remembering, or making decisions?: No Does  the patient have difficulty walking or climbing stairs?: No Does the patient have difficulty dressing or bathing?: No Does the patient have difficulty doing errands alone such as visiting a doctor's office or shopping?: No    Assessment & Plan  1. Dyslipidemia due to type 2 diabetes mellitus (HCC)  - POCT HgB A1C - HM Diabetes Foot Exam - pioglitazone (ACTOS) 15 MG tablet; Take 1 tablet (15 mg total) by mouth daily.  Dispense: 90 tablet; Refill: 0  2. Type 2 diabetes mellitus with other specified complication, without long-term current use of insulin (HCC)  - Urine Microalbumin w/creat. ratio  3. Hypertension goal BP (blood pressure) < 140/90  - lisinopril (ZESTRIL) 10 MG tablet; TAKE 1 TABLET (10 MG TOTAL) BY MOUTH DAILY.  Dispense: 90 tablet; Refill: 0 - pioglitazone (ACTOS) 15 MG tablet; Take 1 tablet (15 mg total) by mouth daily.  Dispense: 90 tablet; Refill: 0

## 2022-05-25 ENCOUNTER — Ambulatory Visit: Payer: 59 | Admitting: Family Medicine

## 2022-05-25 ENCOUNTER — Encounter: Payer: Self-pay | Admitting: Family Medicine

## 2022-05-25 ENCOUNTER — Other Ambulatory Visit: Payer: Self-pay

## 2022-05-25 VITALS — BP 128/72 | HR 89 | Resp 16 | Ht 69.0 in | Wt 235.0 lb

## 2022-05-25 DIAGNOSIS — E1169 Type 2 diabetes mellitus with other specified complication: Secondary | ICD-10-CM | POA: Diagnosis not present

## 2022-05-25 DIAGNOSIS — E785 Hyperlipidemia, unspecified: Secondary | ICD-10-CM

## 2022-05-25 DIAGNOSIS — I10 Essential (primary) hypertension: Secondary | ICD-10-CM | POA: Diagnosis not present

## 2022-05-25 LAB — POCT GLYCOSYLATED HEMOGLOBIN (HGB A1C): Hemoglobin A1C: 8.6 % — AB (ref 4.0–5.6)

## 2022-05-25 MED ORDER — LISINOPRIL 10 MG PO TABS
ORAL_TABLET | Freq: Every day | ORAL | 0 refills | Status: DC
  Filled 2022-05-25: qty 30, 30d supply, fill #0
  Filled 2022-06-02 – 2022-06-29 (×2): qty 30, 30d supply, fill #1
  Filled 2022-08-04: qty 30, 30d supply, fill #2

## 2022-05-25 MED ORDER — PIOGLITAZONE HCL 15 MG PO TABS
15.0000 mg | ORAL_TABLET | Freq: Every day | ORAL | 0 refills | Status: DC
  Filled 2022-05-25: qty 30, 30d supply, fill #0
  Filled 2022-06-24: qty 30, 30d supply, fill #1
  Filled 2022-07-21 (×2): qty 15, 15d supply, fill #2

## 2022-05-26 ENCOUNTER — Other Ambulatory Visit: Payer: Self-pay

## 2022-05-26 LAB — MICROALBUMIN / CREATININE URINE RATIO
Creatinine, Urine: 183 mg/dL (ref 20–320)
Microalb Creat Ratio: 3 mcg/mg creat (ref <30)
Microalb, Ur: 0.5 mg/dL

## 2022-06-02 ENCOUNTER — Other Ambulatory Visit: Payer: Self-pay

## 2022-06-07 ENCOUNTER — Other Ambulatory Visit: Payer: Self-pay

## 2022-06-25 ENCOUNTER — Other Ambulatory Visit: Payer: Self-pay

## 2022-06-29 ENCOUNTER — Other Ambulatory Visit: Payer: Self-pay

## 2022-06-29 ENCOUNTER — Other Ambulatory Visit: Payer: Self-pay | Admitting: Family Medicine

## 2022-06-29 DIAGNOSIS — N529 Male erectile dysfunction, unspecified: Secondary | ICD-10-CM

## 2022-07-21 ENCOUNTER — Other Ambulatory Visit: Payer: Self-pay

## 2022-08-04 ENCOUNTER — Other Ambulatory Visit: Payer: Self-pay | Admitting: Family Medicine

## 2022-08-04 DIAGNOSIS — J309 Allergic rhinitis, unspecified: Secondary | ICD-10-CM

## 2022-08-05 ENCOUNTER — Other Ambulatory Visit: Payer: Self-pay

## 2022-08-05 MED ORDER — FLUTICASONE PROPIONATE 50 MCG/ACT NA SUSP
NASAL | 3 refills | Status: DC
  Filled 2022-08-05: qty 16, 30d supply, fill #0
  Filled 2022-10-25: qty 16, 30d supply, fill #1
  Filled 2022-11-24: qty 16, 30d supply, fill #2

## 2022-08-20 ENCOUNTER — Other Ambulatory Visit: Payer: Self-pay | Admitting: Family Medicine

## 2022-08-20 ENCOUNTER — Other Ambulatory Visit: Payer: Self-pay

## 2022-08-20 DIAGNOSIS — E1169 Type 2 diabetes mellitus with other specified complication: Secondary | ICD-10-CM

## 2022-08-20 DIAGNOSIS — I10 Essential (primary) hypertension: Secondary | ICD-10-CM

## 2022-08-23 ENCOUNTER — Other Ambulatory Visit: Payer: Self-pay | Admitting: Family Medicine

## 2022-08-23 DIAGNOSIS — N529 Male erectile dysfunction, unspecified: Secondary | ICD-10-CM

## 2022-08-24 ENCOUNTER — Other Ambulatory Visit: Payer: Self-pay

## 2022-08-24 MED ORDER — SILDENAFIL CITRATE 50 MG PO TABS: 50.0000 mg | ORAL_TABLET | Freq: Every day | ORAL | 0 refills | Status: DC | PRN

## 2022-08-24 MED FILL — Pioglitazone HCl Tab 15 MG (Base Equiv): ORAL | 30 days supply | Qty: 30 | Fill #0 | Status: AC

## 2022-08-24 MED FILL — Dulaglutide Soln Auto-injector 1.5 MG/0.5ML: SUBCUTANEOUS | 28 days supply | Qty: 2 | Fill #0 | Status: AC

## 2022-08-26 ENCOUNTER — Other Ambulatory Visit: Payer: Self-pay | Admitting: Family Medicine

## 2022-08-26 DIAGNOSIS — N529 Male erectile dysfunction, unspecified: Secondary | ICD-10-CM

## 2022-08-30 ENCOUNTER — Other Ambulatory Visit: Payer: Self-pay

## 2022-08-30 ENCOUNTER — Other Ambulatory Visit: Payer: Self-pay | Admitting: Family Medicine

## 2022-08-30 DIAGNOSIS — I10 Essential (primary) hypertension: Secondary | ICD-10-CM

## 2022-08-30 MED ORDER — LISINOPRIL 10 MG PO TABS
ORAL_TABLET | Freq: Every day | ORAL | 3 refills | Status: DC
  Filled 2022-08-30: qty 30, 30d supply, fill #0
  Filled 2022-09-26: qty 30, 30d supply, fill #1
  Filled 2022-10-29: qty 30, 30d supply, fill #2
  Filled 2022-11-26 – 2022-12-03 (×2): qty 30, 30d supply, fill #3
  Filled 2022-12-27: qty 30, 30d supply, fill #4
  Filled 2023-02-02: qty 30, 30d supply, fill #5
  Filled 2023-03-01: qty 30, 30d supply, fill #6
  Filled 2023-03-28: qty 30, 30d supply, fill #7
  Filled 2023-04-29: qty 30, 30d supply, fill #8
  Filled 2023-05-30: qty 30, 30d supply, fill #9

## 2022-09-22 MED FILL — Dulaglutide Soln Auto-injector 1.5 MG/0.5ML: SUBCUTANEOUS | 28 days supply | Qty: 2 | Fill #1 | Status: AC

## 2022-09-23 ENCOUNTER — Other Ambulatory Visit: Payer: Self-pay | Admitting: Family Medicine

## 2022-09-23 ENCOUNTER — Other Ambulatory Visit: Payer: Self-pay

## 2022-09-23 DIAGNOSIS — E785 Hyperlipidemia, unspecified: Secondary | ICD-10-CM

## 2022-09-24 ENCOUNTER — Other Ambulatory Visit: Payer: Self-pay

## 2022-09-24 MED ORDER — ATORVASTATIN CALCIUM 40 MG PO TABS
ORAL_TABLET | Freq: Every day | ORAL | 0 refills | Status: DC
  Filled 2022-09-24: qty 30, 30d supply, fill #0
  Filled 2022-10-25: qty 30, 30d supply, fill #1
  Filled 2022-11-19: qty 30, 30d supply, fill #2

## 2022-09-26 MED FILL — Pioglitazone HCl Tab 15 MG (Base Equiv): ORAL | 30 days supply | Qty: 30 | Fill #1 | Status: AC

## 2022-09-27 ENCOUNTER — Other Ambulatory Visit: Payer: Self-pay

## 2022-09-29 LAB — HM DIABETES EYE EXAM

## 2022-10-18 ENCOUNTER — Telehealth: Payer: 59 | Admitting: Physician Assistant

## 2022-10-18 ENCOUNTER — Other Ambulatory Visit: Payer: Self-pay

## 2022-10-18 DIAGNOSIS — J069 Acute upper respiratory infection, unspecified: Secondary | ICD-10-CM

## 2022-10-18 MED ORDER — PROMETHAZINE-DM 6.25-15 MG/5ML PO SYRP
5.0000 mL | ORAL_SOLUTION | Freq: Four times a day (QID) | ORAL | 0 refills | Status: DC | PRN
  Filled 2022-10-18: qty 118, 6d supply, fill #0

## 2022-10-18 MED ORDER — AZELASTINE HCL 0.1 % NA SOLN
1.0000 | Freq: Two times a day (BID) | NASAL | 0 refills | Status: DC
  Filled 2022-10-18: qty 30, 31d supply, fill #0

## 2022-10-18 MED ORDER — BENZONATATE 100 MG PO CAPS
100.0000 mg | ORAL_CAPSULE | Freq: Three times a day (TID) | ORAL | 0 refills | Status: DC | PRN
  Filled 2022-10-18: qty 30, 10d supply, fill #0

## 2022-10-18 NOTE — Progress Notes (Signed)
E-Visit for Upper Respiratory Infection   We are sorry you are not feeling well.  Here is how we plan to help!  Based on what you have shared with me, it looks like you may have a viral upper respiratory infection.  Upper respiratory infections are caused by a large number of viruses; however, rhinovirus is the most common cause.   Symptoms vary from person to person, with common symptoms including sore throat, cough, fatigue or lack of energy and feeling of general discomfort.  A low-grade fever of up to 100.4 may present, but is often uncommon.  Symptoms vary however, and are closely related to a person's age or underlying illnesses.  The most common symptoms associated with an upper respiratory infection are nasal discharge or congestion, cough, sneezing, headache and pressure in the ears and face.  These symptoms usually persist for about 3 to 10 days, but can last up to 2 weeks.  It is important to know that upper respiratory infections do not cause serious illness or complications in most cases.    Upper respiratory infections can be transmitted from person to person, with the most common method of transmission being a person's hands.  The virus is able to live on the skin and can infect other persons for up to 2 hours after direct contact.  Also, these can be transmitted when someone coughs or sneezes; thus, it is important to cover the mouth to reduce this risk.  To keep the spread of the illness at [place], good hand hygiene is very important.  This is an infection that is most likely caused by a virus. There are no specific treatments other than to help you with the symptoms until the infection runs its course.  We are sorry you are not feeling well.  Here is how we plan to help!   For nasal congestion, you may use an oral decongestants such as Mucinex D or if you have glaucoma or high blood pressure use plain Mucinex.  Saline nasal spray or nasal drops can help and can safely be used as often as  needed for congestion.  For your congestion, I have prescribed Azelastine nasal spray two sprays in each nostril twice a day  If you do not have a history of heart disease, hypertension, diabetes or thyroid disease, prostate/bladder issues or glaucoma, you may also use Sudafed to treat nasal congestion.  It is highly recommended that you consult with a pharmacist or your primary care physician to ensure this medication is safe for you to take.     If you have a cough, you may use cough suppressants such as Delsym and Robitussin.  If you have glaucoma or high blood pressure, you can also use Coricidin HBP.   For cough I have prescribed for you A prescription cough medication called Tessalon Perles 100 mg. You may take 1-2 capsules every 8 hours as needed for cough and Promethazine DM cough syrup Take 46m every 6 hours as needed for cough. Can be used together.  If you have a sore or scratchy throat, use a saltwater gargle-  to  teaspoon of salt dissolved in a 4-ounce to 8-ounce glass of warm water.  Gargle the solution for approximately 15-30 seconds and then spit.  It is important not to swallow the solution.  You can also use throat lozenges/cough drops and Chloraseptic spray to help with throat pain or discomfort.  Warm or cold liquids can also be helpful in relieving throat pain.  For  headache, pain or general discomfort, you can use Ibuprofen or Tylenol as directed.   Some authorities believe that zinc sprays or the use of Echinacea may shorten the course of your symptoms.   HOME CARE Only take medications as instructed by your medical team. Be sure to drink plenty of fluids. Water is fine as well as fruit juices, sodas and electrolyte beverages. You may want to stay away from caffeine or alcohol. If you are nauseated, try taking small sips of liquids. How do you know if you are getting enough fluid? Your urine should be a pale yellow or almost colorless. Get rest. Taking a steamy shower or  using a humidifier may help nasal congestion and ease sore throat pain. You can place a towel over your head and breathe in the steam from hot water coming from a faucet. Using a saline nasal spray works much the same way. Cough drops, hard candies and sore throat lozenges may ease your cough. Avoid close contacts especially the very young and the elderly Cover your mouth if you cough or sneeze Always remember to wash your hands.   GET HELP RIGHT AWAY IF: You develop worsening fever. If your symptoms do not improve within 10 days You develop yellow or green discharge from your nose over 3 days. You have coughing fits You develop a severe head ache or visual changes. You develop shortness of breath, difficulty breathing or start having chest pain Your symptoms persist after you have completed your treatment plan  MAKE SURE YOU  Understand these instructions. Will watch your condition. Will get help right away if you are not doing well or get worse.  Thank you for choosing an e-visit.  Your e-visit answers were reviewed by a board certified advanced clinical practitioner to complete your personal care plan. Depending upon the condition, your plan could have included both over the counter or prescription medications.  Please review your pharmacy choice. Make sure the pharmacy is open so you can pick up prescription now. If there is a problem, you may contact your provider through [employer] and have the prescription routed to another pharmacy.  Your safety is important to [place]. If you have drug allergies check your prescription carefully.   For the next 24 hours you can use MyChart to ask questions about today's visit, request a non-urgent call back, or ask for a work or school excuse. You will get an email in the next two days asking about your experience. I hope that your e-visit has been valuable and will speed your recovery.   I have spent 5 minutes in review of e-visit  questionnaire, review and updating patient chart, medical decision making and response to patient.   Mar Daring, PA-C

## 2022-10-25 ENCOUNTER — Other Ambulatory Visit: Payer: Self-pay

## 2022-10-25 MED ORDER — COVID-19 MRNA VAC-TRIS(PFIZER) 30 MCG/0.3ML IM SUSY
PREFILLED_SYRINGE | INTRAMUSCULAR | 0 refills | Status: DC
  Filled 2022-10-25: qty 0.3, 1d supply, fill #0

## 2022-10-25 MED ORDER — FLUARIX QUADRIVALENT 0.5 ML IM SUSY
PREFILLED_SYRINGE | INTRAMUSCULAR | 0 refills | Status: DC
  Filled 2022-10-25: qty 0.5, 1d supply, fill #0

## 2022-10-25 MED FILL — Dulaglutide Soln Auto-injector 1.5 MG/0.5ML: SUBCUTANEOUS | 28 days supply | Qty: 2 | Fill #2 | Status: AC

## 2022-10-25 MED FILL — Pioglitazone HCl Tab 15 MG (Base Equiv): ORAL | 30 days supply | Qty: 30 | Fill #2 | Status: AC

## 2022-10-26 ENCOUNTER — Other Ambulatory Visit: Payer: Self-pay

## 2022-10-28 ENCOUNTER — Other Ambulatory Visit: Payer: Self-pay

## 2022-10-29 ENCOUNTER — Other Ambulatory Visit: Payer: Self-pay

## 2022-11-06 ENCOUNTER — Other Ambulatory Visit: Payer: Self-pay | Admitting: Family Medicine

## 2022-11-06 DIAGNOSIS — E785 Hyperlipidemia, unspecified: Secondary | ICD-10-CM

## 2022-11-06 DIAGNOSIS — N529 Male erectile dysfunction, unspecified: Secondary | ICD-10-CM

## 2022-11-08 ENCOUNTER — Other Ambulatory Visit: Payer: Self-pay

## 2022-11-08 ENCOUNTER — Encounter: Payer: Self-pay | Admitting: Family Medicine

## 2022-11-08 MED ORDER — FREESTYLE LIBRE 3 SENSOR MISC
1.0000 | 1 refills | Status: DC
  Filled 2022-11-08: qty 6, 84d supply, fill #0
  Filled 2023-01-24: qty 6, 84d supply, fill #1

## 2022-11-18 ENCOUNTER — Other Ambulatory Visit: Payer: Self-pay

## 2022-11-18 MED FILL — Dulaglutide Soln Auto-injector 1.5 MG/0.5ML: SUBCUTANEOUS | 28 days supply | Qty: 2 | Fill #3 | Status: AC

## 2022-11-23 NOTE — Progress Notes (Unsigned)
Name: Wayne Flowers   MRN: [id number]    DOB: 07/29/1976   Date:11/24/2022       Progress Note  Subjective  Chief Complaint  Follow Up  HPI  DM type II: he was diagnosed about 10 years ago, he has tried Metformin but caused diarrhea. He is currently on trulicity 1.5 mg and Actos  , we tried [place] but he stopped due to increase in urinary frequency, he has been using CGM and average glucose is 164 for the past 90 days.  He denies  polyphagia or polydipsia, he still has some polyuria - he states seems to improve when he cuts down on caffeine  . He has dyslipidemia and is on statin therapy, Viagra for ED and needs a refill. He has gained weight since last visit , willing to go up on Trulicity dose to 3 mg daily and change from Actos to [place], explained urinary frequency will improve if he cuts down on caffeine and once glucose improves.   Dyslipidemia: he is on Atorvastatin ,we will recheck labs today   GERD: doing well on Pepcid BID , he is avoiding spicy and red sauces and is controlling symptoms    Fatty liver : eating healthier, losing weight, last liver enzymes were normal, we will stop Actos, fatty liver will improve with weight loss and glucose control   OSA: not wearing CPAP., he could not tolerate it  Unchanged   Patient Active Problem List   Diagnosis Date Noted   Polyp of descending colon    History of small bowel obstruction 01/25/2020   Hearing loss 03/11/2017   Hyperlipidemia LDL goal <70 07/19/2016   Allergic rhinitis with postnasal drip 12/22/2015   OSA (obstructive sleep apnea) 06/16/2015   Hypertension goal BP (blood pressure) < 140/90 06/16/2015   Gastroesophageal reflux disease 06/16/2015   Stiffness of right shoulder joint 06/16/2015   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   Low libido 08/22/2013    Past Surgical History:  Procedure Laterality Date   COLONOSCOPY WITH PROPOFOL N/A 04/21/2020   Procedure: COLONOSCOPY WITH PROPOFOL;   Surgeon: Lucilla Lame, MD;  Location: [facility];  Service: Endoscopy;  Laterality: N/A;   CYSTOSCOPY W/ RETROGRADES Left 09/16/2015   Procedure: CYSTOSCOPY WITH RETROGRADE PYELOGRAM;  Surgeon: Royston Cowper, MD;  Location: [facility];  Service: Urology;  Laterality: Left;   CYSTOSCOPY WITH URETEROSCOPY AND STENT PLACEMENT     EXTRACORPOREAL SHOCK WAVE LITHOTRIPSY Left 03/26/2021   Procedure: EXTRACORPOREAL SHOCK WAVE LITHOTRIPSY (ESWL);  Surgeon: Royston Cowper, MD;  Location: [facility];  Service: Urology;  Laterality: Left;   POLYPECTOMY  04/21/2020   Procedure: POLYPECTOMY;  Surgeon: Lucilla Lame, MD;  Location: [place] [facility];  Service: Endoscopy;;   URETEROSCOPY WITH HOLMIUM LASER LITHOTRIPSY Left 09/16/2015   Procedure: URETEROSCOPY WITH HOLMIUM LASER LITHOTRIPSY;  Surgeon: Royston Cowper, MD;  Location: [facility];  Service: Urology;  Laterality: Left;   VASECTOMY      Family History  Problem Relation Age of Onset   Diabetes Mother    Cancer Father        renal   Spina bifida Sister    Appendicitis Son    Cancer Maternal Grandfather        black lung   Cancer Paternal Grandmother        lung   Colon cancer Paternal Grandmother    Diabetes Sister     Social History   Tobacco Use   Smoking status: Never  Smokeless tobacco: Never  Substance Use Topics   Alcohol use: Yes    Alcohol/week: 0.0 - 1.0 standard drinks of alcohol    Comment: rarley     Current Outpatient Medications:    blood glucose meter kit and supplies KIT, Dispense based on patient and insurance preference. Use once daily for fasting blood sugar monitoring for Dx T2DM uncontrolled, ICD 10-E11.65, Disp: 1 each, Rfl: 0   Continuous Blood Gluc Sensor (FREESTYLE LIBRE 3 SENSOR) MISC, 1 each by Does not apply route every 14 (fourteen) days. Place 1 sensor on the skin every 14 days. Use to check glucose continuously, Disp: 6 each, Rfl: 1   dapagliflozin propanediol (FARXIGA) 10 MG TABS tablet,  Take 1 tablet (10 mg total) by mouth daily before breakfast., Disp: 90 tablet, Rfl: 1   dicyclomine (BENTYL) 20 MG tablet, Take 1 tablet (20 mg total) by mouth 3 (three) times daily as needed for spasms (abd cramping)., Disp: 60 tablet, Rfl: 1   Dulaglutide (TRULICITY) 3 UT/6.5YY SOPN, Inject 3 mg as directed once a week. New dose, Disp: 6 mL, Rfl: 0   lisinopril (ZESTRIL) 10 MG tablet, TAKE 1 TABLET (10 MG TOTAL) BY MOUTH DAILY., Disp: 90 tablet, Rfl: 3   atorvastatin (LIPITOR) 40 MG tablet, TAKE 1 TABLET BY MOUTH AT BEDTIME, Disp: 90 tablet, Rfl: 1   famotidine (PEPCID) 20 MG tablet, TAKE 1 TABLET BY MOUTH 2 TIMES DAILY AS NEEDED FOR HEARTBURN OR INDIGESTION., Disp: 180 tablet, Rfl: 3   fluticasone (FLONASE) 50 MCG/ACT nasal spray, USE 2 SPRAYS INTO EACH NOSTRIL ONCE DAILY, Disp: 48 g, Rfl: 1   sildenafil (VIAGRA) 100 MG tablet, Take 1 tablet (100 mg total) by mouth daily as needed for erectile dysfunction (30 mins prior to sexual activity)., Disp: 90 tablet, Rfl: 0  Allergies  Allergen Reactions   Omeprazole Other (See Comments)    Reaction:  Memory loss    Metformin And Related Diarrhea    I personally reviewed active problem list, medication list, allergies, family history, social history, health maintenance with the patient/caregiver today.   ROS  Constitutional: Negative for fever, positive for weight change.  Respiratory: Negative for cough and shortness of breath.   Cardiovascular: Negative for chest pain or palpitations.  Gastrointestinal: Negative for abdominal pain, no bowel changes.  Musculoskeletal: Negative for gait problem or joint swelling.  Skin: Negative for rash.  Neurological: Negative for dizziness or headache.  No other specific complaints in a complete review of systems (except as listed in HPI above).   Objective  Vitals:   11/24/22 1454  BP: 126/74  Pulse: 94  Resp: 16  Temp: 98.6 F (37 C)  TempSrc: Oral  SpO2: 98%  Weight: 240 lb 12.8 oz (109.2 kg)   Height: _0  (1.753 m)    Body mass index is 35.56 kg/m.  Physical Exam  Constitutional: Patient appears well-developed and well-nourished. Obese  No distress.  HEENT: head atraumatic, normocephalic, pupils equal and reactive to light, neck supple Cardiovascular: Normal rate, regular rhythm and normal heart sounds.  No murmur heard. No BLE edema. Pulmonary/Chest: Effort normal and breath sounds normal. No respiratory distress. Abdominal: Soft.  There is no tenderness. Psychiatric: Patient has a normal mood and affect. behavior is normal. Judgment and thought content normal.   Recent Results (from the past 2160 hour(s))  POCT HgB A1C     Status: Abnormal   Collection Time: 11/24/22  2:57 PM  Result Value Ref Range   Hemoglobin A1C 8.3 (  A) 4.0 - 5.6 %   HbA1c POC (<> result, manual entry)     HbA1c, POC (prediabetic range)     HbA1c, POC (controlled diabetic range)       PHQ2/9:    11/24/2022    2:56 PM 05/25/2022    2:33 PM 02/16/2022    2:51 PM 11/18/2021    3:18 PM 05/12/2021    9:09 AM  Depression screen PHQ 2/9  Decreased Interest 0 0 0 0 0  Down, Depressed, Hopeless 0 0 0 0 0  PHQ - 2 Score 0 0 0 0 0  Altered sleeping 0  0 0 0  Tired, decreased energy 0  0 0 0  Change in appetite 0  0 0 0  Feeling bad or failure about yourself  0  0 0 0  Trouble concentrating 0  0 0 0  Moving slowly or fidgety/restless 0  0 0 0  Suicidal thoughts 0  0 0 0  PHQ-9 Score 0  0 0 0  Difficult doing work/chores     Not difficult at all    phq 9 is negative   Fall Risk:    11/24/2022    2:56 PM 05/25/2022    2:33 PM 02/16/2022    2:51 PM 11/18/2021    3:18 PM 05/12/2021    9:09 AM  Fall Risk   Falls in the past year? 0 0 0 0 0  Number falls in past yr:  0 0 0 0  Injury with Fall?  0 0 0 0  Risk for fall due to : No Fall Risks No Fall Risks No Fall Risks No Fall Risks   Follow up Falls prevention discussed;Education provided;Falls evaluation completed Falls prevention  discussed Falls prevention discussed Falls prevention discussed     Assessment & Plan  1. Dyslipidemia due to type 2 diabetes mellitus (HCC)  - POCT HgB A1C - Dulaglutide (TRULICITY) 3 NI/6.2VO SOPN; Inject 3 mg as directed once a week. New dose  Dispense: 6 mL; Refill: 0 - dapagliflozin propanediol (FARXIGA) 10 MG TABS tablet; Take 1 tablet (10 mg total) by mouth daily before breakfast.  Dispense: 90 tablet; Refill: 1 - Lipid panel - COMPLETE METABOLIC PANEL WITH GFR  2. Hyperlipidemia LDL goal <70  - atorvastatin (LIPITOR) 40 MG tablet; Take 1 tablet (40 mg total) by mouth at bedtime.  Dispense: 90 tablet; Refill: 1  3. Gastroesophageal reflux disease, unspecified whether esophagitis present  - famotidine (PEPCID) 20 MG tablet; Take 1 tablet (20 mg total) by mouth 2 (two) times daily as needed for heartburn or indigestion.  Dispense: 180 tablet; Refill: 3  4. Allergic rhinitis with postnasal drip  - fluticasone (FLONASE) 50 MCG/ACT nasal spray; USE 2 SPRAYS INTO EACH NOSTRIL ONCE DAILY  Dispense: 48 g; Refill: 1  5. ED (erectile dysfunction) of organic origin  - sildenafil (VIAGRA) 100 MG tablet; Take 1 tablet (100 mg total) by mouth daily as needed for erectile dysfunction (30 mins prior to sexual activity).  Dispense: 90 tablet; Refill: 0

## 2022-11-24 ENCOUNTER — Ambulatory Visit: Payer: 59 | Admitting: Family Medicine

## 2022-11-24 ENCOUNTER — Encounter: Payer: Self-pay | Admitting: Family Medicine

## 2022-11-24 ENCOUNTER — Other Ambulatory Visit: Payer: Self-pay | Admitting: Family Medicine

## 2022-11-24 ENCOUNTER — Other Ambulatory Visit: Payer: Self-pay

## 2022-11-24 VITALS — BP 126/74 | HR 94 | Temp 98.6°F | Resp 16 | Ht 69.0 in | Wt 240.8 lb

## 2022-11-24 DIAGNOSIS — K219 Gastro-esophageal reflux disease without esophagitis: Secondary | ICD-10-CM

## 2022-11-24 DIAGNOSIS — N529 Male erectile dysfunction, unspecified: Secondary | ICD-10-CM

## 2022-11-24 DIAGNOSIS — I10 Essential (primary) hypertension: Secondary | ICD-10-CM

## 2022-11-24 DIAGNOSIS — J309 Allergic rhinitis, unspecified: Secondary | ICD-10-CM | POA: Diagnosis not present

## 2022-11-24 DIAGNOSIS — E1169 Type 2 diabetes mellitus with other specified complication: Secondary | ICD-10-CM

## 2022-11-24 DIAGNOSIS — R0982 Postnasal drip: Secondary | ICD-10-CM

## 2022-11-24 DIAGNOSIS — E785 Hyperlipidemia, unspecified: Secondary | ICD-10-CM | POA: Diagnosis not present

## 2022-11-24 LAB — POCT GLYCOSYLATED HEMOGLOBIN (HGB A1C): Hemoglobin A1C: 8.3 % — AB (ref 4.0–5.6)

## 2022-11-24 MED ORDER — TRULICITY 3 MG/0.5ML ~~LOC~~ SOAJ
3.0000 mg | SUBCUTANEOUS | 0 refills | Status: DC
  Filled 2022-11-24: qty 2, 28d supply, fill #0
  Filled 2022-12-27: qty 2, 28d supply, fill #1
  Filled 2023-01-24: qty 2, 28d supply, fill #2

## 2022-11-24 MED ORDER — FLUTICASONE PROPIONATE 50 MCG/ACT NA SUSP
2.0000 | Freq: Every day | NASAL | 1 refills | Status: DC
  Filled 2022-11-26 – 2022-12-27 (×2): qty 16, 30d supply, fill #0
  Filled 2023-01-24: qty 16, 30d supply, fill #1
  Filled 2023-02-23: qty 16, 30d supply, fill #2
  Filled 2023-03-28: qty 16, 30d supply, fill #3
  Filled 2023-04-26: qty 16, 30d supply, fill #4
  Filled 2023-05-22: qty 16, 30d supply, fill #5

## 2022-11-24 MED ORDER — SILDENAFIL CITRATE 100 MG PO TABS: 100.0000 mg | ORAL_TABLET | Freq: Every day | ORAL | 0 refills | Status: DC | PRN

## 2022-11-24 MED ORDER — DAPAGLIFLOZIN PROPANEDIOL 10 MG PO TABS
10.0000 mg | ORAL_TABLET | Freq: Every day | ORAL | 1 refills | Status: DC
  Filled 2022-11-24: qty 30, 30d supply, fill #0

## 2022-11-24 MED ORDER — ATORVASTATIN CALCIUM 40 MG PO TABS
40.0000 mg | ORAL_TABLET | Freq: Every day | ORAL | 1 refills | Status: DC
  Filled 2022-11-24: qty 90, 90d supply, fill #0
  Filled 2022-11-26 – 2022-12-27 (×2): qty 30, 30d supply, fill #0
  Filled 2023-01-24: qty 30, 30d supply, fill #1
  Filled 2023-02-23: qty 30, 30d supply, fill #2
  Filled 2023-03-28: qty 30, 30d supply, fill #3
  Filled 2023-04-26: qty 30, 30d supply, fill #4
  Filled 2023-05-22: qty 30, 30d supply, fill #5

## 2022-11-24 MED ORDER — FAMOTIDINE 20 MG PO TABS
20.0000 mg | ORAL_TABLET | Freq: Two times a day (BID) | ORAL | 3 refills | Status: DC | PRN
  Filled 2022-11-24: qty 180, 90d supply, fill #0
  Filled 2023-03-02 – 2023-03-03 (×2): qty 60, 30d supply, fill #0
  Filled 2023-04-01: qty 60, 30d supply, fill #1
  Filled 2023-05-04: qty 60, 30d supply, fill #2
  Filled 2023-06-06 – 2023-07-12 (×2): qty 60, 30d supply, fill #3
  Filled 2023-08-17: qty 60, 30d supply, fill #4
  Filled 2023-09-20: qty 60, 30d supply, fill #5

## 2022-11-25 ENCOUNTER — Other Ambulatory Visit: Payer: Self-pay

## 2022-11-25 ENCOUNTER — Other Ambulatory Visit: Payer: Self-pay | Admitting: Family Medicine

## 2022-11-25 LAB — COMPLETE METABOLIC PANEL WITH GFR
AG Ratio: 2.1 (calc) (ref 1.0–2.5)
ALT: 35 U/L (ref 9–46)
AST: 18 U/L (ref 10–40)
Albumin: 4.6 g/dL (ref 3.6–5.1)
Alkaline phosphatase (APISO): 66 U/L (ref 36–130)
BUN: 16 mg/dL (ref 7–25)
CO2: 26 mmol/L (ref 20–32)
Calcium: 9.5 mg/dL (ref 8.6–10.3)
Chloride: 105 mmol/L (ref 98–110)
Creat: 0.94 mg/dL (ref 0.60–1.29)
Globulin: 2.2 g/dL (calc) (ref 1.9–3.7)
Glucose, Bld: 98 mg/dL (ref 65–99)
Potassium: 4.1 mmol/L (ref 3.5–5.3)
Sodium: 141 mmol/L (ref 135–146)
Total Bilirubin: 0.5 mg/dL (ref 0.2–1.2)
Total Protein: 6.8 g/dL (ref 6.1–8.1)
eGFR: 100 mL/min/{1.73_m2} (ref >=60)

## 2022-11-25 LAB — LIPID PANEL
Cholesterol: 126 mg/dL (ref <200)
HDL: 48 mg/dL (ref >=40)
LDL Cholesterol (Calc): 52 mg/dL (calc)
Non-HDL Cholesterol (Calc): 78 mg/dL (calc) (ref <130)
Total CHOL/HDL Ratio: 2.6 (calc) (ref <5.0)
Triglycerides: 182 mg/dL — ABNORMAL HIGH (ref <150)

## 2022-11-25 MED ORDER — PIOGLITAZONE HCL 15 MG PO TABS
15.0000 mg | ORAL_TABLET | Freq: Every day | ORAL | 0 refills | Status: DC
  Filled 2022-11-25: qty 30, 30d supply, fill #0
  Filled 2022-12-27: qty 30, 30d supply, fill #1
  Filled 2023-01-24: qty 30, 30d supply, fill #2

## 2022-11-26 ENCOUNTER — Other Ambulatory Visit: Payer: Self-pay

## 2022-11-30 ENCOUNTER — Other Ambulatory Visit: Payer: Self-pay

## 2022-12-03 ENCOUNTER — Other Ambulatory Visit: Payer: Self-pay

## 2022-12-06 ENCOUNTER — Other Ambulatory Visit: Payer: Self-pay

## 2022-12-27 ENCOUNTER — Other Ambulatory Visit: Payer: Self-pay

## 2023-01-24 ENCOUNTER — Other Ambulatory Visit: Payer: Self-pay

## 2023-02-14 ENCOUNTER — Ambulatory Visit: Payer: 59 | Admitting: Dermatology

## 2023-02-22 NOTE — Progress Notes (Unsigned)
Name: Wayne Flowers   MRN: [id number]    DOB: January 15, 1976   Date:02/23/2023       Progress Note  Subjective  Chief Complaint  Follow Up  HPI  DM type II: he was diagnosed about 10 years ago, he has tried Metformin but caused diarrhea. He is currently on trulicity 3 mg and Actos  , we tried [place] but he stopped due to increase in urinary frequency, he has been using CGM and average glucose is 163 for the past 90 days, post prandial levels are good but fasting levels have been above 150, explained we need to improve his fasting levels..  He denies  polyphagia or polydipsia, he still has some polyuria. He has dyslipidemia and is on statin therapy, Viagra for ED He states only recently he noticed decrease in appetite with higher dose of Trulicity. He states he snacks at night on cookies and chips while reading or watching TV  - he does not want to add medications at this time, he will try to not eat in his bedroom after dinner   Dyslipidemia: he is on Atorvastatin , LDL is at goal and we will continue current dose of medication   GERD: doing well on Pepcid BID , he is avoiding spicy and red sauces and is controlling symptoms Unchanged    Fatty liver : eating healthier, weight is down one pound and he is still taking Actos  OSA: not wearing CPAP., he could not tolerate it  Unchanged   Patient Active Problem List   Diagnosis Date Noted   Polyp of descending colon    History of small bowel obstruction 01/25/2020   Hearing loss 03/11/2017   Hyperlipidemia LDL goal <70 07/19/2016   Allergic rhinitis with postnasal drip 12/22/2015   OSA (obstructive sleep apnea) 06/16/2015   Hypertension goal BP (blood pressure) < 140/90 06/16/2015   Gastroesophageal reflux disease 06/16/2015   Stiffness of right shoulder joint 06/16/2015   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   Low libido 08/22/2013    Past Surgical History:  Procedure Laterality Date   COLONOSCOPY WITH  PROPOFOL N/A 04/21/2020   Procedure: COLONOSCOPY WITH PROPOFOL;  Surgeon: Lucilla Lame, MD;  Location: [place];  Service: Endoscopy;  Laterality: N/A;   CYSTOSCOPY W/ RETROGRADES Left 09/16/2015   Procedure: CYSTOSCOPY WITH RETROGRADE PYELOGRAM;  Surgeon: Royston Cowper, MD;  Location: [facility];  Service: Urology;  Laterality: Left;   CYSTOSCOPY WITH URETEROSCOPY AND STENT PLACEMENT     EXTRACORPOREAL SHOCK WAVE LITHOTRIPSY Left 03/26/2021   Procedure: EXTRACORPOREAL SHOCK WAVE LITHOTRIPSY (ESWL);  Surgeon: Royston Cowper, MD;  Location: [facility];  Service: Urology;  Laterality: Left;   POLYPECTOMY  04/21/2020   Procedure: POLYPECTOMY;  Surgeon: Lucilla Lame, MD;  Location: [place];  Service: Endoscopy;;   URETEROSCOPY WITH HOLMIUM LASER LITHOTRIPSY Left 09/16/2015   Procedure: URETEROSCOPY WITH HOLMIUM LASER LITHOTRIPSY;  Surgeon: Royston Cowper, MD;  Location: [facility];  Service: Urology;  Laterality: Left;   VASECTOMY      Family History  Problem Relation Age of Onset   Diabetes Mother    Cancer Father        renal   Spina bifida Sister    Appendicitis Son    Cancer Maternal Grandfather        black lung   Cancer Paternal Grandmother        lung   Colon cancer Paternal Grandmother    Diabetes Sister  Social History   Tobacco Use   Smoking status: Never   Smokeless tobacco: Never  Substance Use Topics   Alcohol use: Yes    Alcohol/week: 0.0 - 1.0 standard drinks of alcohol    Comment: rarley     Current Outpatient Medications:    atorvastatin (LIPITOR) 40 MG tablet, Take 1 tablet (40 mg total) by mouth at bedtime., Disp: 90 tablet, Rfl: 1   Continuous Blood Gluc Sensor (FREESTYLE LIBRE 3 SENSOR) MISC, 1 each by Does not apply route every 14 (fourteen) days. Place 1 sensor on the skin every 14 days. Use to check glucose continuously, Disp: 6 each, Rfl: 1   famotidine (PEPCID) 20 MG tablet, Take 1 tablet (20 mg total) by mouth 2 (two) times  daily as needed for heartburn or indigestion., Disp: 180 tablet, Rfl: 3   fluticasone (FLONASE) 50 MCG/ACT nasal spray, Place 2 sprays into both nostrils daily., Disp: 48 g, Rfl: 1   lisinopril (ZESTRIL) 10 MG tablet, TAKE 1 TABLET (10 MG TOTAL) BY MOUTH DAILY., Disp: 90 tablet, Rfl: 3   tadalafil (CIALIS) 5 MG tablet, Take 1 tablet (5 mg total) by mouth daily., Disp: 90 tablet, Rfl: 0   Dulaglutide (TRULICITY) 3 0000000 SOPN, Inject 3 mg as directed once a week. New dose, Disp: 6 mL, Rfl: 0   pioglitazone (ACTOS) 15 MG tablet, Take 1 tablet (15 mg total) by mouth daily., Disp: 90 tablet, Rfl: 0  Allergies  Allergen Reactions   Omeprazole Other (See Comments)    Reaction:  Memory loss    Metformin And Related Diarrhea    I personally reviewed active problem list, medication list, allergies, family history, social history, health maintenance with the patient/caregiver today.   ROS  Ten systems reviewed and is negative except as mentioned in HPI   Objective  Vitals:   02/23/23 1347  BP: 124/72  Pulse: 96  Resp: 16  Temp: (!) 97.5 F (36.4 C)  TempSrc: Oral  SpO2: 97%  Weight: 240 lb 6.4 oz (109 kg)  Height: 5\' 9"  (1.753 m)    Body mass index is 35.5 kg/m.  Physical Exam  Constitutional: Patient appears well-developed and well-nourished. Obese  No distress.  HEENT: head atraumatic, normocephalic, pupils equal and reactive to light, neck supple Cardiovascular: Normal rate, regular rhythm and normal heart sounds.  No murmur heard. No BLE edema. Pulmonary/Chest: Effort normal and breath sounds normal. No respiratory distress. Abdominal: Soft.  There is no tenderness. Psychiatric: Patient has a normal mood and affect. behavior is normal. Judgment and thought content normal.   Recent Results (from the past 2160 hour(s))  POCT HgB A1C     Status: Abnormal   Collection Time: 02/23/23  1:50 PM  Result Value Ref Range   Hemoglobin A1C 7.6 (A) 4.0 - 5.6 %   HbA1c POC (<>  result, manual entry)     HbA1c, POC (prediabetic range)     HbA1c, POC (controlled diabetic range)      PHQ2/9:    02/23/2023    1:49 PM 11/24/2022    2:56 PM 05/25/2022    2:33 PM 02/16/2022    2:51 PM 11/18/2021    3:18 PM  Depression screen PHQ 2/9  Decreased Interest 0 0 0 0 0  Down, Depressed, Hopeless 0 0 0 0 0  PHQ - 2 Score 0 0 0 0 0  Altered sleeping 0 0  0 0  Tired, decreased energy 0 0  0 0  Change in appetite  0 0  0 0  Feeling bad or failure about yourself  0 0  0 0  Trouble concentrating 0 0  0 0  Moving slowly or fidgety/restless 0 0  0 0  Suicidal thoughts 0 0  0 0  PHQ-9 Score 0 0  0 0    phq 9 is negative   Fall Risk:    02/23/2023    1:49 PM 11/24/2022    2:56 PM 05/25/2022    2:33 PM 02/16/2022    2:51 PM 11/18/2021    3:18 PM  Fall Risk   Falls in the past year? 0 0 0 0 0  Number falls in past yr:   0 0 0  Injury with Fall?   0 0 0  Risk for fall due to : No Fall Risks No Fall Risks No Fall Risks No Fall Risks No Fall Risks  Follow up Falls prevention discussed Falls prevention discussed;Education provided;Falls evaluation completed Falls prevention discussed Falls prevention discussed Falls prevention discussed      Assessment & Plan   1. Dyslipidemia due to type 2 diabetes mellitus (HCC)  - POCT HgB A1C - Dulaglutide (TRULICITY) 3 0000000 SOPN; Inject 3 mg into the skin once a week. New dose  Dispense: 6 mL; Refill: 0 - pioglitazone (ACTOS) 15 MG tablet; Take 1 tablet (15 mg total) by mouth daily.  Dispense: 90 tablet; Refill: 0  2. Morbid obesity ([place])  Discussed with the patient the risk posed by an increased BMI. Discussed importance of portion control, calorie counting and at least 150 minutes of physical activity weekly. Avoid sweet beverages and drink more water. Eat at least 6 servings of fruit and vegetables daily    3. ED (erectile dysfunction) of organic origin  - tadalafil (CIALIS) 5 MG tablet; Take 1 tablet (5 mg total) by  mouth daily.  Dispense: 90 tablet; Refill: 0  We will change to cialis daily since he has been taking viagra almost daily   4. GERD without esophagitis  Doing well   5. Hypertension goal BP (blood pressure) < 140/90  At goal

## 2023-02-23 ENCOUNTER — Other Ambulatory Visit: Payer: Self-pay

## 2023-02-23 ENCOUNTER — Other Ambulatory Visit: Payer: Self-pay | Admitting: Family Medicine

## 2023-02-23 ENCOUNTER — Ambulatory Visit: Payer: 59 | Admitting: Family Medicine

## 2023-02-23 VITALS — BP 124/72 | HR 96 | Temp 97.5°F | Resp 16 | Ht 69.0 in | Wt 240.4 lb

## 2023-02-23 DIAGNOSIS — K219 Gastro-esophageal reflux disease without esophagitis: Secondary | ICD-10-CM | POA: Diagnosis not present

## 2023-02-23 DIAGNOSIS — N529 Male erectile dysfunction, unspecified: Secondary | ICD-10-CM

## 2023-02-23 DIAGNOSIS — E1169 Type 2 diabetes mellitus with other specified complication: Secondary | ICD-10-CM | POA: Diagnosis not present

## 2023-02-23 DIAGNOSIS — E785 Hyperlipidemia, unspecified: Secondary | ICD-10-CM

## 2023-02-23 DIAGNOSIS — I10 Essential (primary) hypertension: Secondary | ICD-10-CM

## 2023-02-23 LAB — POCT GLYCOSYLATED HEMOGLOBIN (HGB A1C): Hemoglobin A1C: 7.6 % — AB (ref 4.0–5.6)

## 2023-02-23 MED ORDER — PIOGLITAZONE HCL 15 MG PO TABS
15.0000 mg | ORAL_TABLET | Freq: Every day | ORAL | 0 refills | Status: DC
  Filled 2023-02-23: qty 30, 30d supply, fill #0
  Filled 2023-03-28: qty 30, 30d supply, fill #1
  Filled 2023-04-26: qty 30, 30d supply, fill #2

## 2023-02-23 MED ORDER — TADALAFIL 5 MG PO TABS: 5.0000 mg | ORAL_TABLET | Freq: Every day | ORAL | 0 refills | Status: DC

## 2023-02-23 MED ORDER — TRULICITY 3 MG/0.5ML ~~LOC~~ SOAJ
3.0000 mg | SUBCUTANEOUS | 0 refills | Status: DC
  Filled 2023-02-23: qty 2, 28d supply, fill #0
  Filled 2023-03-02 – 2023-03-28 (×2): qty 2, 28d supply, fill #1
  Filled 2023-04-26: qty 2, 28d supply, fill #2

## 2023-03-01 ENCOUNTER — Encounter: Payer: Self-pay | Admitting: Family Medicine

## 2023-03-03 ENCOUNTER — Other Ambulatory Visit: Payer: Self-pay

## 2023-03-03 IMAGING — CT CT HEART MORP W/ CTA COR W/ SCORE W/ CA W/CM &/OR W/O CM
1 of 13 series · 4 of 20 positions shown, 5 images · non-contrast
Comparison: None.

Addendum:
CLINICAL DATA: Chest pain

EXAM:
Cardiac/Coronary  CTA
TECHNIQUE: The patient was scanned on a Siemens Somatom go.Top scanner.

[Series 26: multiphase % cta coronary 0.60 · axial · 0.36mm/px · z∈[-1109,-1043]mm · 4 of 3312 slices shown, 5 images]
[im 663/3312  vessel]
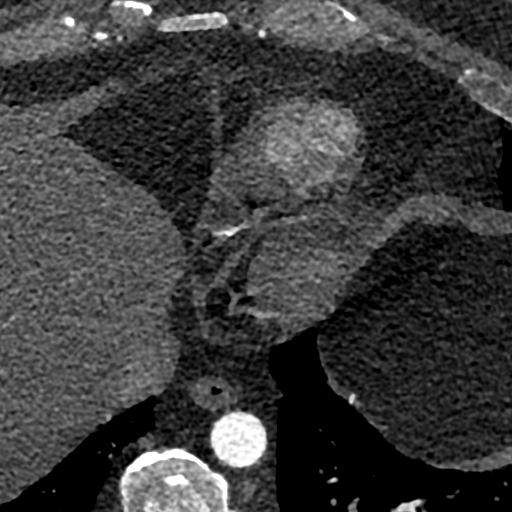
[im 663/3312  lung]
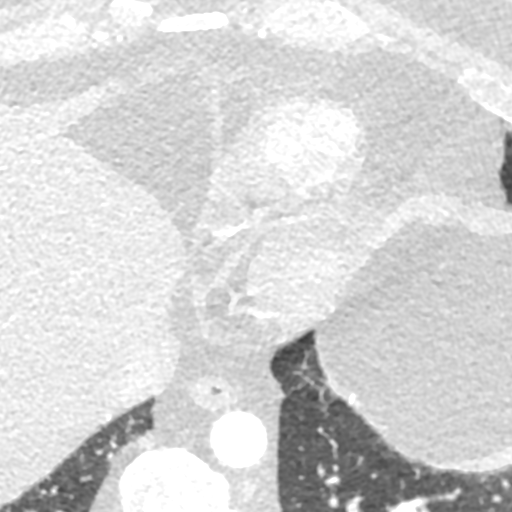
[im 1325/3312  vessel]
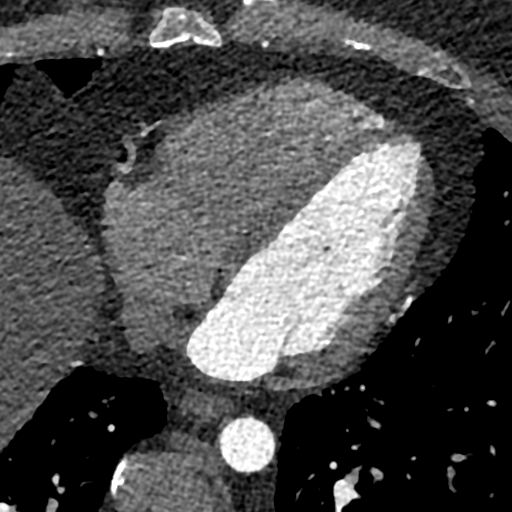
[im 1987/3312  vessel]
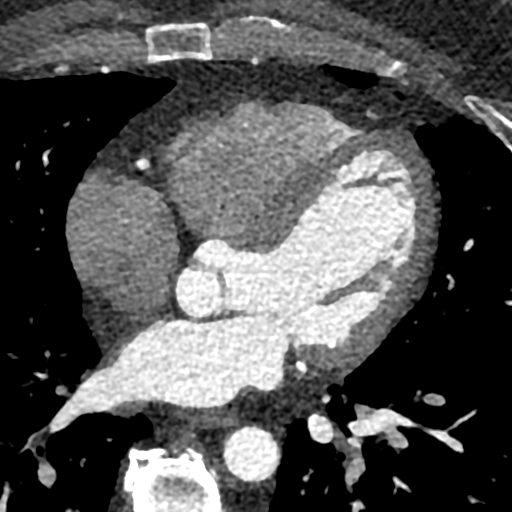
[im 2649/3312  vessel]
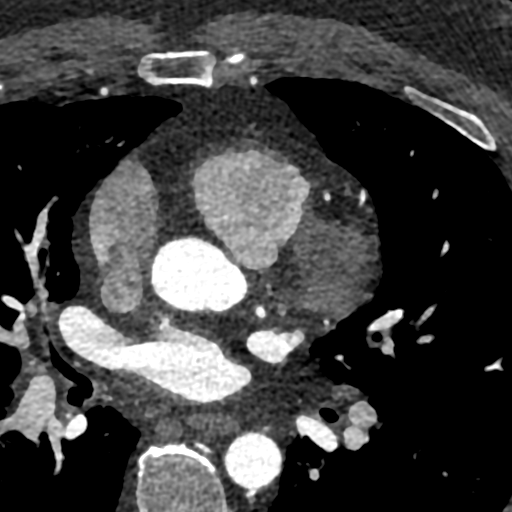

[4 of 20 positions shown; findings below may reference images not displayed]



Aortic Valve:  Trileaflet.  No calcifications.

Coronary Arteries:  Normal coronary origin.  Right dominance.

RCA is a dominant artery that gives rise to PDA and PLA. There is no
plaque.

Left main is a large artery that gives rise to LAD and LCX arteries.
There is no LM disease.

LAD is a large vessel that has no plaque.

LCX is a non-dominant artery that gives rise to one large OM1
branch. There is no plaque.

Other findings:

Normal pulmonary vein drainage into the left atrium.

Normal left atrial appendage without a thrombus.

Normal size of the pulmonary artery.
IMPRESSION: 1. Coronary calcium score of 0. Patient is low risk for coronary
events.

2. Normal coronary origin with right dominance.

3. No evidence of CAD.

4. CAD-RADS 0. Consider non-atherosclerotic causes of chest pain.

EXAM:
OVER-READ INTERPRETATION  CT CHEST

The following report is an over-read performed by radiologist Dr.
over-read does not include interpretation of cardiac or coronary
anatomy or pathology. The coronary calcium score and cardiac CTA
interpretation by the cardiologist is attached.
FINDINGS: Within the visualized portions of the thorax there are no suspicious
appearing pulmonary nodules or masses, there is no acute
consolidative airspace disease, no pleural effusions, no
pneumothorax and no lymphadenopathy. Visualized portions of the
upper abdomen demonstrates diffuse low attenuation throughout the
visualized hepatic parenchyma, indicative of hepatic steatosis.
There are no aggressive appearing lytic or blastic lesions noted in
the visualized portions of the skeleton.
IMPRESSION: 1. Hepatic steatosis.



Aortic Valve:  Trileaflet.  No calcifications.

Coronary Arteries:  Normal coronary origin.  Right dominance.

RCA is a dominant artery that gives rise to PDA and PLA. There is no
plaque.

Left main is a large artery that gives rise to LAD and LCX arteries.
There is no LM disease.

LAD is a large vessel that has no plaque.

LCX is a non-dominant artery that gives rise to one large OM1
branch. There is no plaque.

Other findings:

Normal pulmonary vein drainage into the left atrium.

Normal left atrial appendage without a thrombus.

Normal size of the pulmonary artery.
IMPRESSION: 1. Coronary calcium score of 0. Patient is low risk for coronary
events.

2. Normal coronary origin with right dominance.

3. No evidence of CAD.

4. CAD-RADS 0. Consider non-atherosclerotic causes of chest pain.

## 2023-04-03 ENCOUNTER — Other Ambulatory Visit: Payer: Self-pay

## 2023-04-13 IMAGING — CT CT RENAL STONE PROTOCOL
2 of 4 series · 16 of 46 positions shown, 18 images · non-contrast
Comparison: None.

CLINICAL DATA: Left flank pain for several days. History of
nephrolithiasis.

EXAM:
CT ABDOMEN AND PELVIS WITHOUT CONTRAST
TECHNIQUE: Multidetector CT imaging of the abdomen and pelvis was performed
following the standard protocol without IV contrast.

[Series 2: stone full standard · axial · 0.89mm/px · z∈[-1050,-525]mm · 13 of 115 slices shown, 15 images]
[im 5/115  soft-tissue]
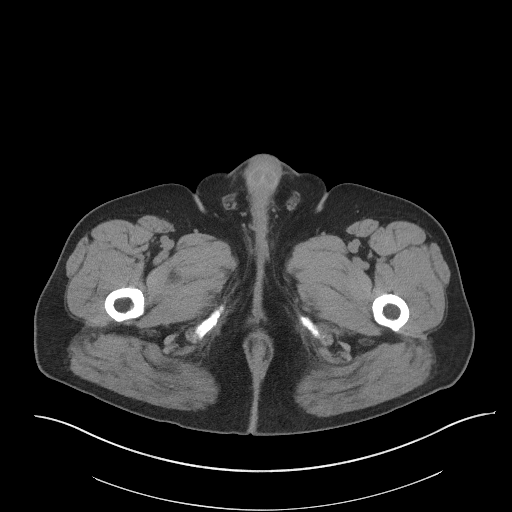
[im 5/115  bone]
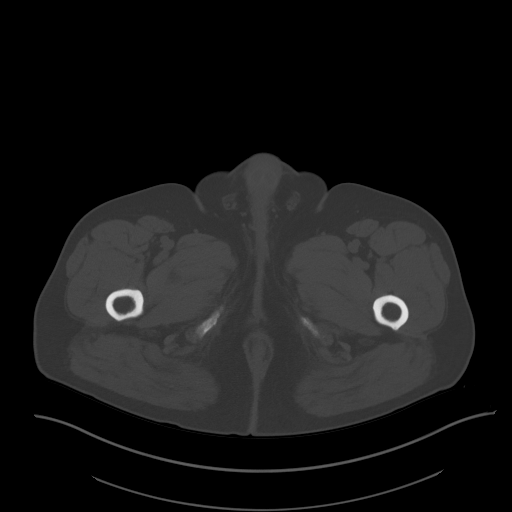
[im 15/115  soft-tissue]
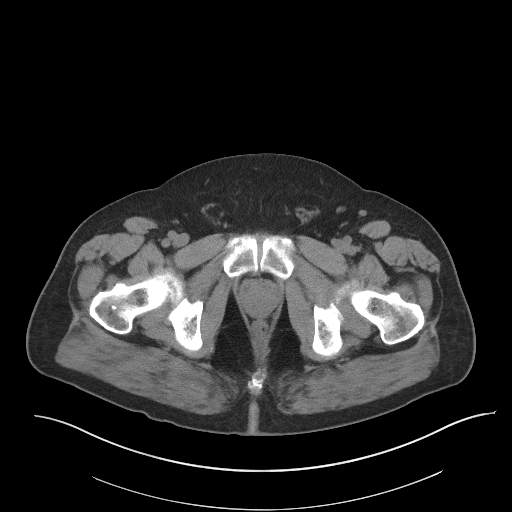
[im 24/115  soft-tissue]
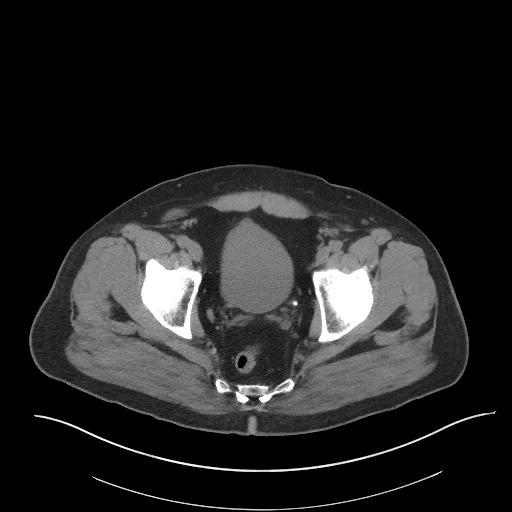
[im 34/115  soft-tissue]
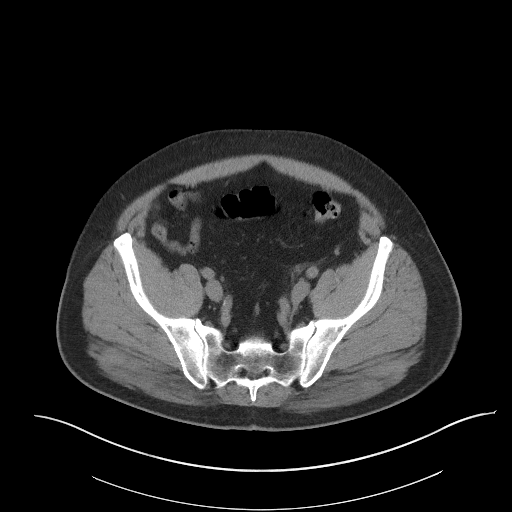
[im 39/115  soft-tissue]
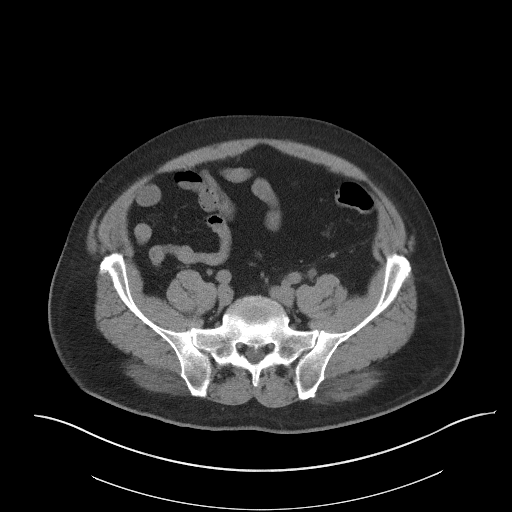
[im 48/115  soft-tissue]
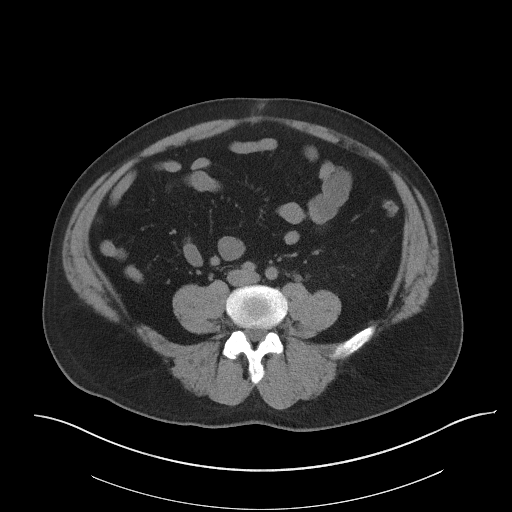
[im 58/115  soft-tissue]
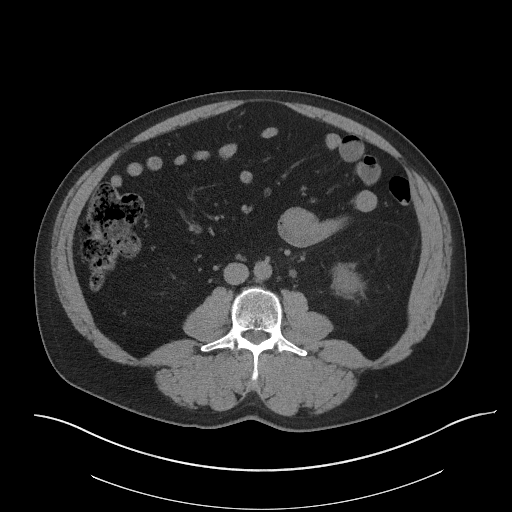
[im 67/115  soft-tissue]
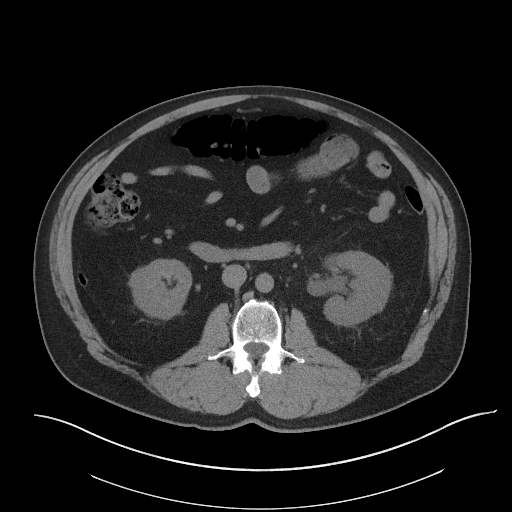
[im 77/115  soft-tissue]
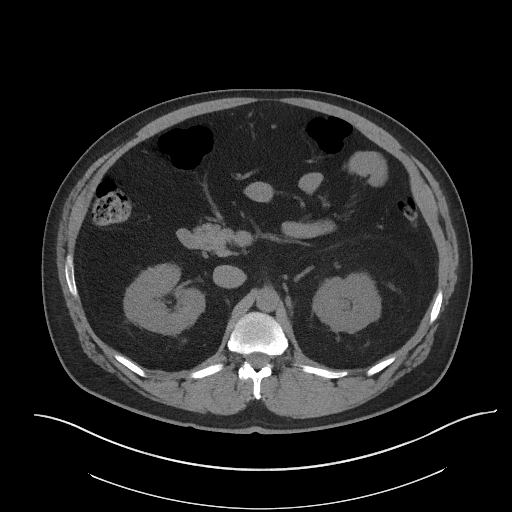
[im 77/115  bone]
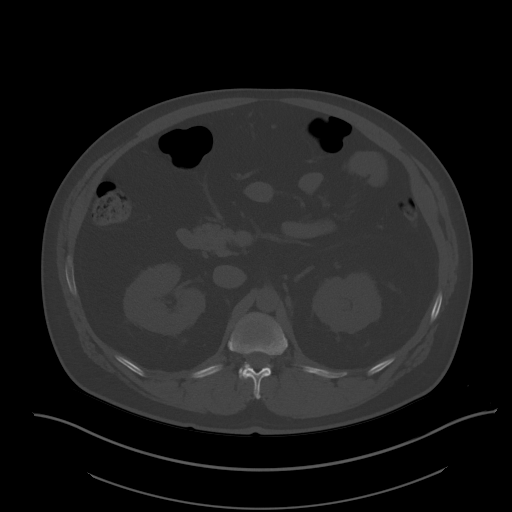
[im 81/115  soft-tissue]
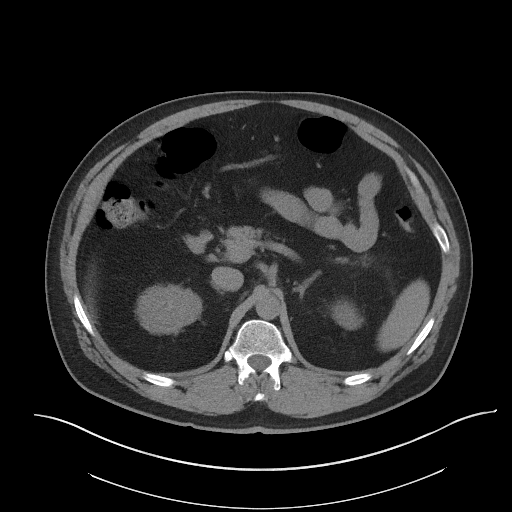
[im 91/115  soft-tissue]
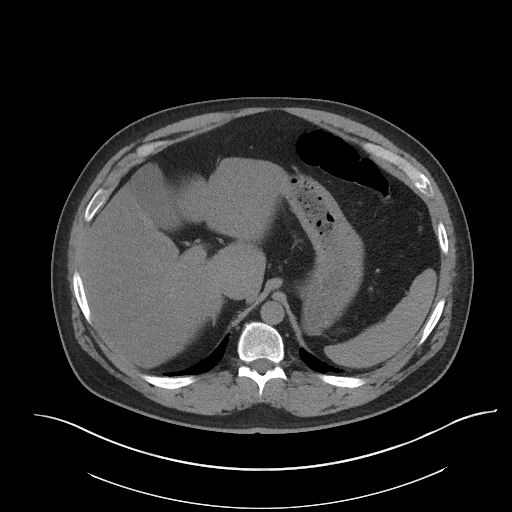
[im 100/115  soft-tissue]
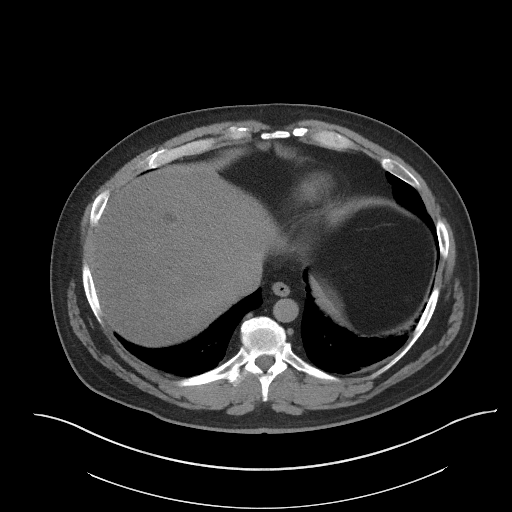
[im 110/115  soft-tissue]
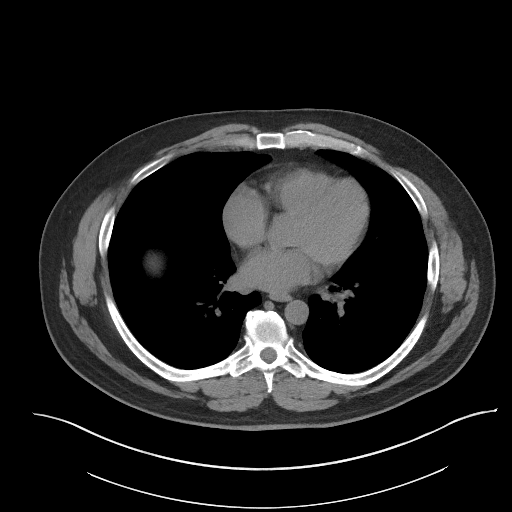

[Series 5: coronal · coronal · 0.84mm/px · 3 of 159 slices shown]
[im 53/159  soft-tissue]
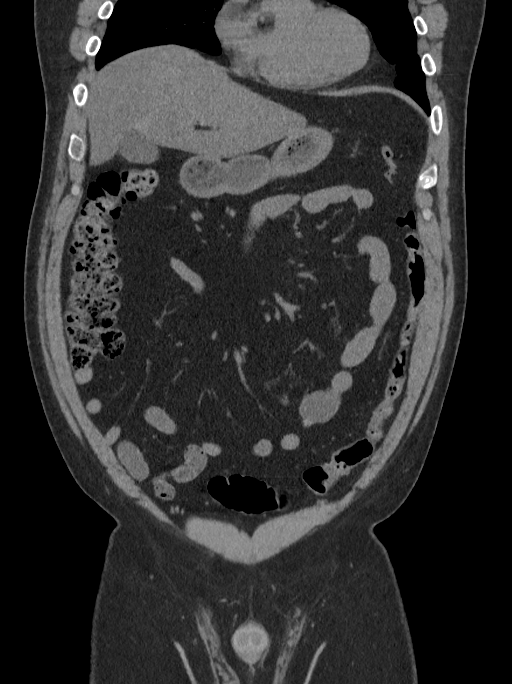
[im 71/159  soft-tissue]
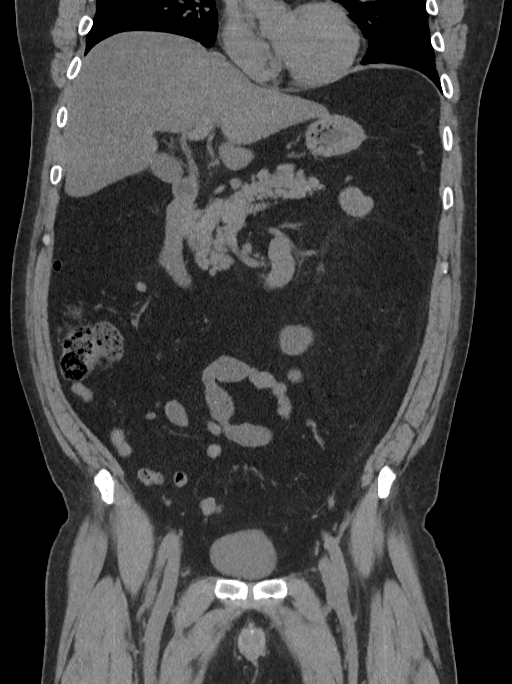
[im 88/159  soft-tissue]
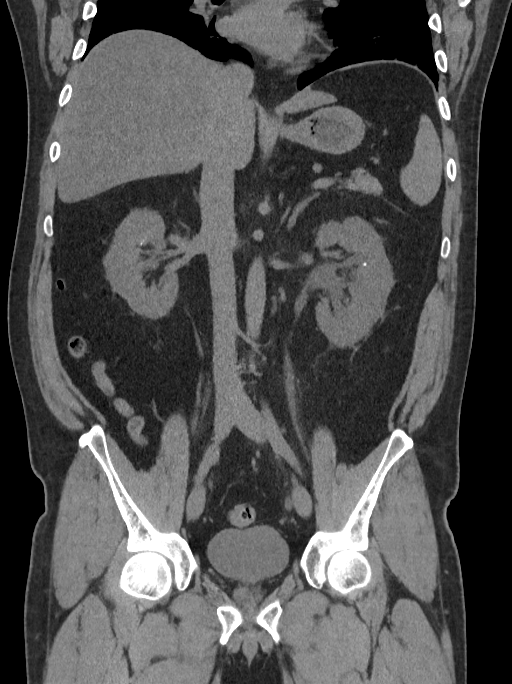

[16 of 46 positions shown; findings below may reference images not displayed]

FINDINGS: Lower chest: Mild hypoventilatory changes at the dependent lung
bases bilaterally.

Hepatobiliary: Diffuse hepatic steatosis. Normal liver size. No
definite liver surface irregularity. Simple 1.2 cm superior liver
cyst. Otherwise no liver masses. Normal gallbladder with no
radiopaque cholelithiasis. No biliary ductal dilatation.

Pancreas: Normal, with no mass or duct dilation.

Spleen: Normal size. No mass.

Adrenals/Urinary Tract: Normal adrenals. Obstructing 5 mm left
pelvic ureteral stone located approximately 1.5 cm above the left
ureterovesical junction, with mild to moderate left
hydroureteronephrosis. No additional ureteral stones. Nonobstructing
3 mm interpolar left renal stone. Nonobstructing 1 mm lower right
renal stone. No right hydronephrosis. Normal caliber right ureter.
No contour deforming renal masses. Normal bladder.

Stomach/Bowel: Normal non-distended stomach. Normal caliber small
bowel with no small bowel wall thickening. Normal appendix. Normal
large bowel with no diverticulosis, large bowel wall thickening or
pericolonic fat stranding.

Vascular/Lymphatic: Normal caliber abdominal aorta. No
pathologically enlarged lymph nodes in the abdomen or pelvis.

Reproductive: Normal size prostate.

Other: No pneumoperitoneum, ascites or focal fluid collection.

Musculoskeletal: No aggressive appearing focal osseous lesions. Mild
thoracolumbar spondylosis.
IMPRESSION: 1. Obstructing 5 mm distal left pelvic ureteral stone, with mild to
moderate left hydroureteronephrosis.
2. Additional nonobstructing stones in both kidneys.
3. Diffuse hepatic steatosis.

## 2023-04-14 ENCOUNTER — Ambulatory Visit: Payer: 59 | Admitting: Dermatology

## 2023-04-14 ENCOUNTER — Other Ambulatory Visit: Payer: Self-pay

## 2023-04-14 VITALS — BP 129/80

## 2023-04-14 DIAGNOSIS — L820 Inflamed seborrheic keratosis: Secondary | ICD-10-CM | POA: Diagnosis not present

## 2023-04-14 DIAGNOSIS — L918 Other hypertrophic disorders of the skin: Secondary | ICD-10-CM

## 2023-04-14 DIAGNOSIS — B079 Viral wart, unspecified: Secondary | ICD-10-CM | POA: Diagnosis not present

## 2023-04-14 DIAGNOSIS — B360 Pityriasis versicolor: Secondary | ICD-10-CM

## 2023-04-14 DIAGNOSIS — L814 Other melanin hyperpigmentation: Secondary | ICD-10-CM | POA: Diagnosis not present

## 2023-04-14 DIAGNOSIS — Z79899 Other long term (current) drug therapy: Secondary | ICD-10-CM

## 2023-04-14 DIAGNOSIS — L821 Other seborrheic keratosis: Secondary | ICD-10-CM

## 2023-04-14 MED ORDER — KETOCONAZOLE 2 % EX SHAM
1.0000 | MEDICATED_SHAMPOO | CUTANEOUS | 5 refills | Status: DC
  Filled 2023-04-14: qty 120, 30d supply, fill #0
  Filled 2023-05-15: qty 120, 30d supply, fill #1

## 2023-04-14 NOTE — Progress Notes (Signed)
New Patient Visit   Subjective  Wayne Flowers is a 47 y.o. male who presents for the following: skin tags, eyelids, neck, irritating, check spot R middle finger, no hx of treatment The patient has spots, moles and lesions to be evaluated, some may be new or changing and the patient may have concern these could be cancer.  The following portions of the chart were reviewed this encounter and updated as appropriate: medications, allergies, medical history  Review of Systems:  No other skin or systemic complaints except as noted in HPI or Assessment and Plan.  Objective  Well appearing patient in no apparent distress; mood and affect are within normal limits.  A focused examination was performed of the following areas: Upper body skin exam  Relevant exam findings are noted in the Assessment and Plan.  neck x 6, L ant shoulder x 3 (9) Stuck on waxy paps with erythema  R 3rd finger x 1 Verrucous papules -- Discussed viral etiology and contagion.   R upper eyelid margin x 1 Stuck on waxy paps with erythema    Assessment & Plan   Acrochordons (Skin Tags) - Fleshy, skin-colored pedunculated papules - Benign appearing.  - Observe. - If desired, they can be removed with an in office procedure that is not covered by insurance. - Please call the clinic if you notice any new or changing lesions.  - axilla  LENTIGINES Exam: scattered tan macules Due to sun exposure Treatment Plan: Benign-appearing, observe. Recommend daily broad spectrum sunscreen SPF 30+ to sun-exposed areas, reapply every 2 hours as needed.  Call for any changes  SEBORRHEIC KERATOSIS - Stuck-on, waxy, tan-brown papules and/or plaques  - Benign-appearing - Discussed benign etiology and prognosis. - Observe - Call for any changes  Tinea Versicolor Exam: hyperpigmented macules scalp  Treatment: Start ketoconazole shampoo to wash scalp 3x/wk for 1 month then 1x/wk for maintenance, let sit 5 minutes  before rinsing off   Tinea versicolor is a chronic recurrent skin rash causing discolored scaly spots most commonly seen on back, chest, and/or shoulders.  It is generally asymptomatic. The rash is due to overgrowth of a common type of yeast present on everyone's skin and it is not contagious.  It tends to flare more in the summer due to increased sweating on trunk.  After rash is treated, the scaliness will resolve, but the discoloration will take longer to return to normal pigmentation. The periodic use of an OTC medicated soap/shampoo with zinc or selenium sulfide can be helpful to prevent yeast overgrowth and recurrence.   Inflamed seborrheic keratosis (9) neck x 6, L ant shoulder x 3  Symptomatic, irritating, patient would like treated.   Destruction of lesion - neck x 6, L ant shoulder x 3 Complexity: simple   Destruction method: cryotherapy   Informed consent: discussed and consent obtained   Timeout:  patient name, date of birth, surgical site, and procedure verified Lesion destroyed using liquid nitrogen: Yes   Region frozen until ice ball extended beyond lesion: Yes   Outcome: patient tolerated procedure well with no complications   Post-procedure details: wound care instructions given    Viral warts, unspecified type R 3rd finger x 1  Viral Wart (HPV) Counseling  Discussed viral / HPV (Human Papilloma Virus) etiology and risk of spread /infectivity to other areas of body as well as to other people.  Multiple treatments and methods may be required to clear warts and it is possible treatment may not be successful.  Treatment risks include discoloration; scarring and there is still potential for wart recurrence.  Destruction of lesion - R 3rd finger x 1 Complexity: simple   Destruction method: cryotherapy   Informed consent: discussed and consent obtained   Timeout:  patient name, date of birth, surgical site, and procedure verified Lesion destroyed using liquid nitrogen: Yes    Region frozen until ice ball extended beyond lesion: Yes   Outcome: patient tolerated procedure well with no complications   Post-procedure details: wound care instructions given    Destruction of lesion - R 3rd finger x 1  Destruction method: chemical removal   Destruction method comment:  Squaric Acid 3%, Cantharidin plus Informed consent: discussed and consent obtained   Timeout:  patient name, date of birth, surgical site, and procedure verified Application time:  4 hours Procedure instructions: patient instructed to wash and dry area   Outcome: patient tolerated procedure well with no complications   Post-procedure details: wound care instructions given   Additional details:  Patient advised to set alarm to remind them to wash off with soap and water at the directed time.  Seborrheic keratosis, inflamed R upper eyelid margin x 1  Symptomatic, irritating, patient would like treated.   Destruction of lesion - R upper eyelid margin x 1 Complexity: simple   Destruction method: cryotherapy   Informed consent: discussed and consent obtained   Timeout:  patient name, date of birth, surgical site, and procedure verified Lesion destroyed using liquid nitrogen: Yes   Region frozen until ice ball extended beyond lesion: Yes   Outcome: patient tolerated procedure well with no complications   Post-procedure details: wound care instructions given      Return in about 3 months (around 07/15/2023) for wart f/u, ISK f/u.  I, Ardis Rowan, RMA, am acting as scribe for Armida Sans, MD .  Documentation: I have reviewed the above documentation for accuracy and completeness, and I agree with the above.  Armida Sans, MD

## 2023-04-14 NOTE — Patient Instructions (Addendum)
Viral Wart (HPV) Counseling  Discussed viral / HPV (Human Papilloma Virus) etiology and risk of spread /infectivity to other areas of body as well as to other people.  Multiple treatments and methods may be required to clear warts and it is possible treatment may not be successful.  Treatment risks include discoloration; scarring and there is still potential for wart recurrence.   Cryotherapy Aftercare  Wash gently with soap and water everyday.   Apply Vaseline and Band-Aid daily until healed.   Instructions for After In-Office Application of Cantharidin  1. This is a strong medicine; please follow ALL instructions.  2. Gently wash off with soap and water in four hours or sooner s directed by your physician.  3. **WARNING** this medicine can cause severe blistering, blood blisters, infection, and/or scarring if it is not washed off as directed.  4. Your progress will be rechecked in 1-2 months; call sooner if there are any questions or problems.   Due to recent changes in healthcare laws, you may see results of your pathology and/or laboratory studies on MyChart before the doctors have had a chance to review them. We understand that in some cases there may be results that are confusing or concerning to you. Please understand that not all results are received at the same time and often the doctors may need to interpret multiple results in order to provide you with the best plan of care or course of treatment. Therefore, we ask that you please give [place] 2 business days to thoroughly review all your results before contacting the office for clarification. Should we see a critical lab result, you will be contacted sooner.   If You Need Anything After Your Visit  If you have any questions or concerns for your doctor, please call our main line at [phone] and press option 4 to reach your doctor's medical assistant. If no one answers, please leave a voicemail as directed and we will return your  call as soon as possible. Messages left after 4 pm will be answered the following business day.   You may also send [place] a message via MyChart. We typically respond to MyChart messages within 1-2 business days.  For prescription refills, please ask your pharmacy to contact our office. Our fax number is [phone].  If you have an urgent issue when the clinic is closed that cannot wait until the next business day, you can page your doctor at the number below.    Please note that while we do our best to be available for urgent issues outside of office hours, we are not available 24/7.   If you have an urgent issue and are unable to reach [place], you may choose to seek medical care at your doctor's office, retail clinic, urgent care center, or emergency room.  If you have a medical emergency, please immediately call 911 or go to the emergency department.  Pager Numbers  - Dr. Gwen Pounds: [phone]  - Dr. Neale Burly: [phone]  - Dr. Roseanne Reno: [phone]  In the event of inclement weather, please call our main line at [phone] for an update on the status of any delays or closures.  Dermatology Medication Tips: Please keep the boxes that topical medications come in in order to help keep track of the instructions about where and how to use these. Pharmacies typically print the medication instructions only on the boxes and not directly on the medication tubes.   If your medication is too expensive, please contact our office at [phone]  option 4 or send [place] a message through MyChart.   We are unable to tell what your co-pay for medications will be in advance as this is different depending on your insurance coverage. However, we may be able to find a substitute medication at lower cost or fill out paperwork to get insurance to cover a needed medication.   If a prior authorization is required to get your medication covered by your insurance company, please allow [place] 1-2 business days to  complete this process.  Drug prices often vary depending on where the prescription is filled and some pharmacies may offer cheaper prices.  The website www.goodrx.com contains coupons for medications through different pharmacies. The prices here do not account for what the cost may be with help from insurance (it may be cheaper with your insurance), but the website can give you the price if you did not use any insurance.  - You can print the associated coupon and take it with your prescription to the pharmacy.  - You may also stop by our office during regular business hours and pick up a GoodRx coupon card.  - If you need your prescription sent electronically to a different pharmacy, notify our office through [facility] or by phone at [phone] option 4.     Si Usted Necesita Algo Despus de Su Visita  Tambin puede enviarnos un mensaje a travs de Clinical cytogeneticist. Por lo general respondemos a los mensajes de MyChart en el transcurso de 1 a 2 das hbiles.  Para renovar recetas, por favor pida a su farmacia que se ponga en contacto con nuestra oficina. Annie Sable de fax es [place] [phone].  Si tiene un asunto urgente cuando la clnica est cerrada y que no puede esperar hasta el siguiente da hbil, puede llamar/localizar a su doctor(a) al nmero que aparece a continuacin.   Por favor, tenga en cuenta que aunque hacemos todo lo posible para estar disponibles para asuntos urgentes fuera del horario de [place], no estamos disponibles las 24 horas del da, los 7 [address]  [address] de la [place].   Si tiene un problema urgente y no puede comunicarse con nosotros, puede optar por buscar atencin mdica  en el consultorio de su doctor(a), en una clnica privada, en un centro de atencin urgente o en una sala de emergencias.  Si tiene Engineer, drilling, por favor llame inmediatamente al 911 o vaya a la sala de emergencias.  Nmeros de bper  - Dr. Gwen Pounds: [phone]  - Dra. Moye:  [phone]  - Dra. Roseanne Reno: [phone]  En caso de inclemencias del [place], por favor llame a Lacy Duverney principal al [phone] para una actualizacin sobre el [place] de cualquier retraso o cierre.  Consejos para la medicacin en dermatologa: Por favor, guarde las cajas en las que vienen los medicamentos de uso tpico para ayudarle a seguir las instrucciones sobre dnde y cmo usarlos. Las farmacias generalmente imprimen las instrucciones del medicamento slo en las cajas y no directamente en los tubos del [place].   Si su medicamento es muy caro, por favor, pngase en contacto con Rolm Gala llamando al [phone] y presione la opcin 4 o envenos un mensaje a travs de Clinical cytogeneticist.   No podemos decirle cul ser su copago por los medicamentos por adelantado ya que esto es diferente dependiendo de la cobertura de su seguro. Sin embargo, es posible que podamos encontrar un medicamento sustituto a Audiological scientist un formulario para que el seguro cubra el medicamento que se considera necesario.  Si se requiere una autorizacin previa para que su compaa de seguros [place] su medicamento, por favor permtanos de 1 a 2 das hbiles para completar este proceso.  Los precios de los medicamentos varan con frecuencia dependiendo del Environmental consultant de dnde se surte la receta y alguna farmacias pueden ofrecer precios ms baratos.  El sitio web www.goodrx.com tiene cupones para medicamentos de Airline pilot. Los precios aqu no tienen en cuenta lo que podra costar con la ayuda del seguro (puede ser ms barato con su seguro), pero el sitio web puede darle el precio si no utiliz Research scientist (physical sciences).  - Puede imprimir el cupn correspondiente y llevarlo con su receta a la farmacia.  - Tambin puede pasar por nuestra oficina durante el horario de atencin regular y Charity fundraiser una tarjeta de cupones de GoodRx.  - Si necesita que su receta se enve electrnicamente a una farmacia diferente,  informe a nuestra oficina a travs de MyChart de [place] o por telfono llamando al [phone] y presione la opcin 4.

## 2023-04-29 ENCOUNTER — Other Ambulatory Visit: Payer: Self-pay

## 2023-04-29 ENCOUNTER — Encounter: Payer: Self-pay | Admitting: Dermatology

## 2023-05-02 ENCOUNTER — Other Ambulatory Visit: Payer: Self-pay | Admitting: Family Medicine

## 2023-05-02 ENCOUNTER — Encounter: Payer: Self-pay | Admitting: Family Medicine

## 2023-05-02 ENCOUNTER — Other Ambulatory Visit: Payer: Self-pay

## 2023-05-02 MED ORDER — TIRZEPATIDE 5 MG/0.5ML ~~LOC~~ SOAJ
5.0000 mg | SUBCUTANEOUS | 0 refills | Status: DC
  Filled 2023-05-02 (×2): qty 2, 28d supply, fill #0

## 2023-05-02 NOTE — Telephone Encounter (Signed)
Patient notified

## 2023-05-04 ENCOUNTER — Other Ambulatory Visit: Payer: Self-pay

## 2023-05-04 ENCOUNTER — Other Ambulatory Visit: Payer: Self-pay | Admitting: Family Medicine

## 2023-05-04 DIAGNOSIS — E785 Hyperlipidemia, unspecified: Secondary | ICD-10-CM

## 2023-05-05 ENCOUNTER — Other Ambulatory Visit: Payer: Self-pay

## 2023-05-05 ENCOUNTER — Other Ambulatory Visit: Payer: Self-pay | Admitting: Family Medicine

## 2023-05-05 DIAGNOSIS — E785 Hyperlipidemia, unspecified: Secondary | ICD-10-CM

## 2023-05-05 MED FILL — Continuous Glucose System Sensor: 84 days supply | Qty: 6 | Fill #0 | Status: AC

## 2023-05-17 ENCOUNTER — Other Ambulatory Visit: Payer: Self-pay | Admitting: Family Medicine

## 2023-05-17 DIAGNOSIS — N529 Male erectile dysfunction, unspecified: Secondary | ICD-10-CM

## 2023-05-19 ENCOUNTER — Other Ambulatory Visit: Payer: Self-pay | Admitting: Family Medicine

## 2023-05-19 DIAGNOSIS — N529 Male erectile dysfunction, unspecified: Secondary | ICD-10-CM

## 2023-05-20 ENCOUNTER — Other Ambulatory Visit: Payer: Self-pay | Admitting: Family Medicine

## 2023-05-20 DIAGNOSIS — N529 Male erectile dysfunction, unspecified: Secondary | ICD-10-CM

## 2023-05-22 ENCOUNTER — Other Ambulatory Visit: Payer: Self-pay | Admitting: Family Medicine

## 2023-05-22 DIAGNOSIS — E785 Hyperlipidemia, unspecified: Secondary | ICD-10-CM

## 2023-05-22 DIAGNOSIS — N529 Male erectile dysfunction, unspecified: Secondary | ICD-10-CM

## 2023-05-23 ENCOUNTER — Encounter: Payer: Self-pay | Admitting: Family Medicine

## 2023-05-23 ENCOUNTER — Other Ambulatory Visit: Payer: Self-pay

## 2023-05-23 ENCOUNTER — Other Ambulatory Visit: Payer: Self-pay | Admitting: Family Medicine

## 2023-05-23 DIAGNOSIS — E785 Hyperlipidemia, unspecified: Secondary | ICD-10-CM

## 2023-05-23 DIAGNOSIS — N529 Male erectile dysfunction, unspecified: Secondary | ICD-10-CM

## 2023-05-23 MED ORDER — TADALAFIL 5 MG PO TABS
5.0000 mg | ORAL_TABLET | Freq: Every day | ORAL | 0 refills | Status: DC
Start: 2023-05-23 — End: 2023-06-08

## 2023-05-23 MED FILL — Pioglitazone HCl Tab 15 MG (Base Equiv): ORAL | 30 days supply | Qty: 30 | Fill #0 | Status: AC

## 2023-05-27 ENCOUNTER — Ambulatory Visit: Payer: 59 | Admitting: Family Medicine

## 2023-05-30 ENCOUNTER — Other Ambulatory Visit: Payer: Self-pay

## 2023-05-30 ENCOUNTER — Other Ambulatory Visit: Payer: Self-pay | Admitting: Family Medicine

## 2023-05-30 MED ORDER — MOUNJARO 5 MG/0.5ML ~~LOC~~ SOAJ
5.0000 mg | SUBCUTANEOUS | 0 refills | Status: DC
  Filled 2023-05-30: qty 2, 28d supply, fill #0

## 2023-06-06 ENCOUNTER — Other Ambulatory Visit: Payer: Self-pay

## 2023-06-07 NOTE — Progress Notes (Unsigned)
Name: Wayne Flowers   MRN: [id number]    DOB: 1976/04/11   Date:06/07/2023       Progress Note  Subjective  Chief Complaint  Follow Up  HPI  DM type II: he was diagnosed about 10 years ago, he has tried Metformin but caused diarrhea. He is currently on trulicity 3 mg and Actos  , we tried [place] but he stopped due to increase in urinary frequency, he has been using CGM and average glucose is 163 for the past 90 days, post prandial levels are good but fasting levels have been above 150, explained we need to improve his fasting levels..  He denies  polyphagia or polydipsia, he still has some polyuria. He has dyslipidemia and is on statin therapy, Viagra for ED He states only recently he noticed decrease in appetite with higher dose of Trulicity. He states he snacks at night on cookies and chips while reading or watching TV  - he does not want to add medications at this time, he will try to not eat in his bedroom after dinner   Dyslipidemia: he is on Atorvastatin , LDL is at goal and we will continue current dose of medication   GERD: doing well on Pepcid BID , he is avoiding spicy and red sauces and is controlling symptoms Unchanged    Fatty liver : eating healthier, weight is down one pound and he is still taking Actos  OSA: not wearing CPAP., he could not tolerate it  Unchanged   Patient Active Problem List   Diagnosis Date Noted   Polyp of descending colon    History of small bowel obstruction 01/25/2020   Hearing loss 03/11/2017   Hyperlipidemia LDL goal <70 07/19/2016   Allergic rhinitis with postnasal drip 12/22/2015   OSA (obstructive sleep apnea) 06/16/2015   Hypertension goal BP (blood pressure) < 140/90 06/16/2015   Gastroesophageal reflux disease 06/16/2015   Stiffness of right shoulder joint 06/16/2015   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   Low libido 08/22/2013    Past Surgical History:  Procedure Laterality Date   COLONOSCOPY WITH  PROPOFOL N/A 04/21/2020   Procedure: COLONOSCOPY WITH PROPOFOL;  Surgeon: Midge Minium, MD;  Location: [facility];  Service: Endoscopy;  Laterality: N/A;   CYSTOSCOPY W/ RETROGRADES Left 09/16/2015   Procedure: CYSTOSCOPY WITH RETROGRADE PYELOGRAM;  Surgeon: Orson Ape, MD;  Location: [facility];  Service: Urology;  Laterality: Left;   CYSTOSCOPY WITH URETEROSCOPY AND STENT PLACEMENT     EXTRACORPOREAL SHOCK WAVE LITHOTRIPSY Left 03/26/2021   Procedure: EXTRACORPOREAL SHOCK WAVE LITHOTRIPSY (ESWL);  Surgeon: Orson Ape, MD;  Location: [facility];  Service: Urology;  Laterality: Left;   POLYPECTOMY  04/21/2020   Procedure: POLYPECTOMY;  Surgeon: Midge Minium, MD;  Location: [facility];  Service: Endoscopy;;   URETEROSCOPY WITH HOLMIUM LASER LITHOTRIPSY Left 09/16/2015   Procedure: URETEROSCOPY WITH HOLMIUM LASER LITHOTRIPSY;  Surgeon: Orson Ape, MD;  Location: [facility];  Service: Urology;  Laterality: Left;   VASECTOMY      Family History  Problem Relation Age of Onset   Diabetes Mother    Cancer Father        renal   Spina bifida Sister    Appendicitis Son    Cancer Maternal Grandfather        black lung   Cancer Paternal Grandmother        lung   Colon cancer Paternal Grandmother    Diabetes Sister  Social History   Tobacco Use   Smoking status: Never   Smokeless tobacco: Never  Substance Use Topics   Alcohol use: Yes    Alcohol/week: 0.0 - 1.0 standard drinks of alcohol    Comment: rarley     Current Outpatient Medications:    atorvastatin (LIPITOR) 40 MG tablet, Take 1 tablet (40 mg total) by mouth at bedtime., Disp: 90 tablet, Rfl: 1   Continuous Glucose Sensor (FREESTYLE LIBRE 3 SENSOR) MISC, 1 each by Does not apply route every 14 (fourteen) days. Place 1 sensor on the skin every 14 days. Use to check glucose continuously, Disp: 6 each, Rfl: 1   famotidine (PEPCID) 20 MG tablet, Take 1 tablet (20 mg total) by mouth 2 (two) times daily  as needed for heartburn or indigestion., Disp: 180 tablet, Rfl: 3   fluticasone (FLONASE) 50 MCG/ACT nasal spray, Place 2 sprays into both nostrils daily., Disp: 48 g, Rfl: 1   ketoconazole (NIZORAL) 2 % shampoo, Apply 1 Application topically 3 (three) times a week. Wash scalp 3 times weekly for 1 month, then once a week for maintenance, let sit 5 minutes before rinsing, Disp: 120 mL, Rfl: 5   lisinopril (ZESTRIL) 10 MG tablet, TAKE 1 TABLET (10 MG TOTAL) BY MOUTH DAILY., Disp: 90 tablet, Rfl: 3   pioglitazone (ACTOS) 15 MG tablet, Take 1 tablet (15 mg total) by mouth daily., Disp: 90 tablet, Rfl: 0   tadalafil (CIALIS) 5 MG tablet, Take 1 tablet (5 mg total) by mouth daily., Disp: 30 tablet, Rfl: 0   tirzepatide (MOUNJARO) 5 MG/0.5ML Pen, Inject 5 mg into the skin once a week., Disp: 2 mL, Rfl: 0  Allergies  Allergen Reactions   Omeprazole Other (See Comments)    Reaction:  Memory loss    Metformin And Related Diarrhea    I personally reviewed active problem list, medication list, allergies, family history, social history, health maintenance with the patient/caregiver today.   ROS  ***  Objective  There were no vitals filed for this visit.  There is no height or weight on file to calculate BMI.  Physical Exam ***  No results found for this or any previous visit (from the past 2160 hour(s)).  Diabetic Foot Exam: Diabetic Foot Exam - Simple   No data filed    ***  PHQ2/9:    02/23/2023    1:49 PM 11/24/2022    2:56 PM 05/25/2022    2:33 PM 02/16/2022    2:51 PM 11/18/2021    3:18 PM  Depression screen PHQ 2/9  Decreased Interest 0 0 0 0 0  Down, Depressed, Hopeless 0 0 0 0 0  PHQ - 2 Score 0 0 0 0 0  Altered sleeping 0 0  0 0  Tired, decreased energy 0 0  0 0  Change in appetite 0 0  0 0  Feeling bad or failure about yourself  0 0  0 0  Trouble concentrating 0 0  0 0  Moving slowly or fidgety/restless 0 0  0 0  Suicidal thoughts 0 0  0 0  PHQ-9 Score 0 0  0 0     phq 9 is {gen pos [id number]}   Fall Risk:    02/23/2023    1:49 PM 11/24/2022    2:56 PM 05/25/2022    2:33 PM 02/16/2022    2:51 PM 11/18/2021    3:18 PM  Fall Risk   Falls in the past year? 0 0 0 0  0  Number falls in past yr:   0 0 0  Injury with Fall?   0 0 0  Risk for fall due to : No Fall Risks No Fall Risks No Fall Risks No Fall Risks No Fall Risks  Follow up Falls prevention discussed Falls prevention discussed;Education provided;Falls evaluation completed Falls prevention discussed Falls prevention discussed Falls prevention discussed      Functional Status Survey:      Assessment & Plan  *** There are no diagnoses linked to this encounter.

## 2023-06-08 ENCOUNTER — Encounter: Payer: Self-pay | Admitting: Family Medicine

## 2023-06-08 ENCOUNTER — Other Ambulatory Visit: Payer: Self-pay

## 2023-06-08 ENCOUNTER — Ambulatory Visit: Payer: 59 | Admitting: Family Medicine

## 2023-06-08 VITALS — BP 122/78 | HR 94 | Temp 98.0°F | Resp 16 | Ht 69.0 in | Wt 235.4 lb

## 2023-06-08 DIAGNOSIS — E785 Hyperlipidemia, unspecified: Secondary | ICD-10-CM

## 2023-06-08 DIAGNOSIS — E669 Obesity, unspecified: Secondary | ICD-10-CM | POA: Diagnosis not present

## 2023-06-08 DIAGNOSIS — Z7984 Long term (current) use of oral hypoglycemic drugs: Secondary | ICD-10-CM

## 2023-06-08 DIAGNOSIS — E1169 Type 2 diabetes mellitus with other specified complication: Secondary | ICD-10-CM | POA: Diagnosis not present

## 2023-06-08 DIAGNOSIS — K219 Gastro-esophageal reflux disease without esophagitis: Secondary | ICD-10-CM

## 2023-06-08 DIAGNOSIS — N529 Male erectile dysfunction, unspecified: Secondary | ICD-10-CM

## 2023-06-08 DIAGNOSIS — Z7985 Long-term (current) use of injectable non-insulin antidiabetic drugs: Secondary | ICD-10-CM | POA: Diagnosis not present

## 2023-06-08 DIAGNOSIS — I10 Essential (primary) hypertension: Secondary | ICD-10-CM

## 2023-06-08 LAB — POCT GLYCOSYLATED HEMOGLOBIN (HGB A1C): Hemoglobin A1C: 6.4 % — AB (ref 4.0–5.6)

## 2023-06-08 MED ORDER — LISINOPRIL 10 MG PO TABS
10.0000 mg | ORAL_TABLET | Freq: Every day | ORAL | 0 refills | Status: DC
Start: 2023-06-08 — End: 2023-09-29
  Filled 2023-06-08: qty 90, fill #0
  Filled 2023-06-21 – 2023-07-08 (×2): qty 30, 30d supply, fill #0
  Filled 2023-08-02: qty 30, 30d supply, fill #1
  Filled 2023-09-01: qty 30, 30d supply, fill #2

## 2023-06-08 MED ORDER — PIOGLITAZONE HCL 15 MG PO TABS
15.0000 mg | ORAL_TABLET | Freq: Every day | ORAL | 0 refills | Status: DC
Start: 2023-06-08 — End: 2023-09-29
  Filled 2023-06-08: qty 30, 30d supply, fill #0
  Filled 2023-08-02: qty 30, 30d supply, fill #1
  Filled 2023-09-01: qty 30, 30d supply, fill #2

## 2023-06-08 MED ORDER — TADALAFIL 5 MG PO TABS
5.0000 mg | ORAL_TABLET | Freq: Every day | ORAL | 0 refills | Status: DC
Start: 2023-06-08 — End: 2023-09-20

## 2023-06-08 MED ORDER — MOUNJARO 5 MG/0.5ML ~~LOC~~ SOAJ
5.0000 mg | SUBCUTANEOUS | 0 refills | Status: DC
Start: 2023-06-08 — End: 2023-09-12
  Filled 2023-06-08: qty 6, 84d supply, fill #0
  Filled 2023-06-21: qty 2, 28d supply, fill #0
  Filled 2023-07-21: qty 2, 28d supply, fill #1
  Filled 2023-08-17: qty 2, 28d supply, fill #2

## 2023-06-08 MED ORDER — ATORVASTATIN CALCIUM 40 MG PO TABS
40.0000 mg | ORAL_TABLET | Freq: Every day | ORAL | 0 refills | Status: DC
Start: 2023-06-08 — End: 2023-09-29
  Filled 2023-06-08: qty 30, 30d supply, fill #0
  Filled 2023-08-02: qty 30, 30d supply, fill #1
  Filled 2023-09-01: qty 30, 30d supply, fill #2

## 2023-06-09 LAB — MICROALBUMIN / CREATININE URINE RATIO
Creatinine, Urine: 219 mg/dL (ref 20–320)
Microalb Creat Ratio: 3 mg/g creat (ref <30)
Microalb, Ur: 0.6 mg/dL

## 2023-06-15 ENCOUNTER — Other Ambulatory Visit: Payer: Self-pay

## 2023-06-17 ENCOUNTER — Other Ambulatory Visit: Payer: Self-pay

## 2023-06-21 ENCOUNTER — Other Ambulatory Visit: Payer: Self-pay

## 2023-06-21 ENCOUNTER — Encounter: Payer: Self-pay | Admitting: Pharmacist

## 2023-06-21 ENCOUNTER — Other Ambulatory Visit: Payer: Self-pay | Admitting: Family Medicine

## 2023-06-21 DIAGNOSIS — J309 Allergic rhinitis, unspecified: Secondary | ICD-10-CM

## 2023-06-21 MED ORDER — FLUTICASONE PROPIONATE 50 MCG/ACT NA SUSP
2.0000 | Freq: Every day | NASAL | 0 refills | Status: DC
Start: 2023-06-21 — End: 2024-01-11
  Filled 2023-06-21 – 2023-08-02 (×2): qty 16, 30d supply, fill #0
  Filled 2023-09-01: qty 16, 30d supply, fill #1
  Filled 2023-11-02: qty 16, 30d supply, fill #2

## 2023-06-22 ENCOUNTER — Other Ambulatory Visit: Payer: Self-pay

## 2023-07-05 MED FILL — Continuous Glucose System Sensor: 84 days supply | Qty: 6 | Fill #1 | Status: AC

## 2023-07-06 ENCOUNTER — Other Ambulatory Visit: Payer: Self-pay

## 2023-07-07 ENCOUNTER — Other Ambulatory Visit: Payer: Self-pay

## 2023-07-08 ENCOUNTER — Other Ambulatory Visit: Payer: Self-pay

## 2023-07-12 ENCOUNTER — Other Ambulatory Visit: Payer: Self-pay

## 2023-07-13 ENCOUNTER — Ambulatory Visit: Payer: 59 | Admitting: Dermatology

## 2023-07-13 VITALS — BP 146/78 | HR 89

## 2023-07-13 DIAGNOSIS — B360 Pityriasis versicolor: Secondary | ICD-10-CM | POA: Diagnosis not present

## 2023-07-13 DIAGNOSIS — B078 Other viral warts: Secondary | ICD-10-CM | POA: Diagnosis not present

## 2023-07-13 DIAGNOSIS — Z7189 Other specified counseling: Secondary | ICD-10-CM

## 2023-07-13 DIAGNOSIS — Z79899 Other long term (current) drug therapy: Secondary | ICD-10-CM

## 2023-07-13 NOTE — Progress Notes (Signed)
Follow-Up Visit   Subjective  Wayne Flowers is a 47 y.o. male who presents for the following: 3 months f/u on Tinea versicolor on the scalp treating with Ketoconazole shampoo with a good response, Recheck a wart on the right 3rd finger that was treated at last office visit.  The patient has spots, moles and lesions to be evaluated, some may be new or changing and the patient may have concern these could be cancer.   The following portions of the chart were reviewed this encounter and updated as appropriate: medications, allergies, medical history  Review of Systems:  No other skin or systemic complaints except as noted in HPI or Assessment and Plan.  Objective  Well appearing patient in no apparent distress; mood and affect are within normal limits.   A focused examination was performed of the following areas:scalp,fingers    Relevant exam findings are noted in the Assessment and Plan.  right 3rd finger Verrucous papules -- Discussed viral etiology and contagion.     Assessment & Plan   Other viral warts right 3rd finger  Viral Wart (HPV) Counseling  Discussed viral / HPV (Human Papilloma Virus) etiology and risk of spread /infectivity to other areas of body as well as to other people.  Multiple treatments and methods may be required to clear warts and it is possible treatment may not be successful.  Treatment risks include discoloration; scarring and there is still potential for wart recurrence.   Cantharidin Plus is a blistering agent that comes from a beetle.  It needs to be washed off in about 4 hours after application.  Although it is painless when applied in office, it may cause symptoms of mild pain and burning several hours later.  Treated areas will swell and turn red, and blisters may form.  Vaseline and a bandaid may be applied until wound has healed.  Once healed, the skin may remain temporarily discolored.  It can take weeks to months for pigmentation to return  to normal.  Advised to wash off with soap and water in 4 hours or sooner if it becomes tender before then.   Squaric Acid 3% applied to warts today. Prior to application reviewed risk of inflammation and irritation.   Destruction of lesion - right 3rd finger Complexity: simple   Destruction method: cryotherapy   Informed consent: discussed and consent obtained   Timeout:  patient name, date of birth, surgical site, and procedure verified Lesion destroyed using liquid nitrogen: Yes   Region frozen until ice ball extended beyond lesion: Yes   Outcome: patient tolerated procedure well with no complications   Post-procedure details: wound care instructions given    Destruction of lesion - right 3rd finger  Destruction method: chemical removal   Destruction method comment:  Squaric 3% acid Informed consent: discussed and consent obtained   Timeout:  patient name, date of birth, surgical site, and procedure verified Chemical destruction method: cantharidin   Procedure instructions: patient instructed to wash and dry area   Outcome: patient tolerated procedure well with no complications   Post-procedure details: wound care instructions given   Additional details:  Patient advised to set alarm to remind them to wash off with soap and water at the directed time.   Tinea Versicolor-Improved  Exam: clear at the scalp    Chronic condition with duration or expected duration over one year. Currently well-controlled.   Treatment: Cont ketoconazole shampoo to wash scalp 1x/wk for maintenance, let sit 5 minutes before rinsing  off   Tinea versicolor is a chronic recurrent skin rash causing discolored scaly spots most commonly seen on back, chest, and/or shoulders.  It is generally asymptomatic. The rash is due to overgrowth of a common type of yeast present on everyone's skin and it is not contagious.  It tends to flare more in the summer due to increased sweating on trunk.  After rash is treated,  the scaliness will resolve, but the discoloration will take longer to return to normal pigmentation. The periodic use of an OTC medicated soap/shampoo with zinc or selenium sulfide can be helpful to prevent yeast overgrowth and recurrence.   Return in about 1 year (around 07/12/2024) for warts, Tinea versicolor.  IAngelique Holm, CMA, am acting as scribe for Armida Sans, MD .   Documentation: I have reviewed the above documentation for accuracy and completeness, and I agree with the above.  Armida Sans, MD

## 2023-07-13 NOTE — Patient Instructions (Addendum)
Instructions for After In-Office Application of Cantharidin  1. This is a strong medicine; please follow ALL instructions.  2. Gently wash off with soap and water in four hours or sooner s directed by your physician.  3. **WARNING** this medicine can cause severe blistering, blood blisters, infection, and/or scarring if it is not washed off as directed.  4. Your progress will be rechecked in 1-2 months; call sooner if there are any questions or problems.     Cryotherapy Aftercare  Wash gently with soap and water everyday.   Apply Vaseline and Band-Aid daily until healed.     Due to recent changes in healthcare laws, you may see results of your pathology and/or laboratory studies on MyChart before the doctors have had a chance to review them. We understand that in some cases there may be results that are confusing or concerning to you. Please understand that not all results are received at the same time and often the doctors may need to interpret multiple results in order to provide you with the best plan of care or course of treatment. Therefore, we ask that you please give [place] 2 business days to thoroughly review all your results before contacting the office for clarification. Should we see a critical lab result, you will be contacted sooner.   If You Need Anything After Your Visit  If you have any questions or concerns for your doctor, please call our main line at [phone] and press option 4 to reach your doctor's medical assistant. If no one answers, please leave a voicemail as directed and we will return your call as soon as possible. Messages left after 4 pm will be answered the following business day.   You may also send [place] a message via MyChart. We typically respond to MyChart messages within 1-2 business days.  For prescription refills, please ask your pharmacy to contact our office. Our fax number is [phone].  If you have an urgent issue when the clinic is closed that  cannot wait until the next business day, you can page your doctor at the number below.    Please note that while we do our best to be available for urgent issues outside of office hours, we are not available 24/7.   If you have an urgent issue and are unable to reach [place], you may choose to seek medical care at your doctor's office, retail clinic, urgent care center, or emergency room.  If you have a medical emergency, please immediately call 911 or go to the emergency department.  Pager Numbers  - Dr. Gwen Pounds: [phone]  - Dr. Roseanne Reno: [phone]  - Dr. Katrinka Blazing: [phone]   In the event of inclement weather, please call our main line at [phone] for an update on the status of any delays or closures.  Dermatology Medication Tips: Please keep the boxes that topical medications come in in order to help keep track of the instructions about where and how to use these. Pharmacies typically print the medication instructions only on the boxes and not directly on the medication tubes.   If your medication is too expensive, please contact our office at [phone] option 4 or send [place] a message through MyChart.   We are unable to tell what your co-pay for medications will be in advance as this is different depending on your insurance coverage. However, we may be able to find a substitute medication at lower cost or fill out paperwork to get insurance to cover a needed medication.  If a prior authorization is required to get your medication covered by your insurance company, please allow [place] 1-2 business days to complete this process.  Drug prices often vary depending on where the prescription is filled and some pharmacies may offer cheaper prices.  The website www.goodrx.com contains coupons for medications through different pharmacies. The prices here do not account for what the cost may be with help from insurance (it may be cheaper with your insurance), but the website can give you  the price if you did not use any insurance.  - You can print the associated coupon and take it with your prescription to the pharmacy.  - You may also stop by our office during regular business hours and pick up a GoodRx coupon card.  - If you need your prescription sent electronically to a different pharmacy, notify our office through [facility] or by phone at [phone] option 4.     Si Usted Necesita Algo Despus de Su Visita  Tambin puede enviarnos un mensaje a travs de Clinical cytogeneticist. Por lo general respondemos a los mensajes de MyChart en el transcurso de 1 a 2 das hbiles.  Para renovar recetas, por favor pida a su farmacia que se ponga en contacto con nuestra oficina. Annie Sable de fax es [place] [phone].  Si tiene un asunto urgente cuando la clnica est cerrada y que no puede esperar hasta el siguiente da hbil, puede llamar/localizar a su doctor(a) al nmero que aparece a continuacin.   Por favor, tenga en cuenta que aunque hacemos todo lo posible para estar disponibles para asuntos urgentes fuera del horario de [place], no estamos disponibles las 24 horas del da, los 7 [address]  [address] de la [place].   Si tiene un problema urgente y no puede comunicarse con nosotros, puede optar por buscar atencin mdica  en el consultorio de su doctor(a), en una clnica privada, en un centro de atencin urgente o en una sala de emergencias.  Si tiene Engineer, drilling, por favor llame inmediatamente al 911 o vaya a la sala de emergencias.  Nmeros de bper  - Dr. Gwen Pounds: [phone]  - Dra. Roseanne Reno: [phone]  - Dr. Katrinka Blazing: [phone]   En caso de inclemencias del tiempo, por favor llame a Lacy Duverney principal al [phone] para una actualizacin sobre el [place] de cualquier retraso o cierre.  Consejos para la medicacin en dermatologa: Por favor, guarde las cajas en las que vienen los medicamentos de uso tpico para ayudarle a seguir las instrucciones sobre dnde y  cmo usarlos. Las farmacias generalmente imprimen las instrucciones del medicamento slo en las cajas y no directamente en los tubos del [place].   Si su medicamento es muy caro, por favor, pngase en contacto con Rolm Gala llamando al [phone] y presione la opcin 4 o envenos un mensaje a travs de Clinical cytogeneticist.   No podemos decirle cul ser su copago por los medicamentos por adelantado ya que esto es diferente dependiendo de la cobertura de su seguro. Sin embargo, es posible que podamos encontrar un medicamento sustituto a Audiological scientist un formulario para que el seguro cubra el medicamento que se considera necesario.   Si se requiere una autorizacin previa para que su compaa de seguros [place] su medicamento, por favor permtanos de 1 a 2 das hbiles para completar [address]  Los precios de los medicamentos varan con frecuencia dependiendo del Environmental consultant de dnde se surte la receta y alguna farmacias pueden ofrecer precios ms baratos.  El sitio web  www.goodrx.com tiene cupones para medicamentos de Health and safety inspector. Los precios aqu no tienen en cuenta lo que podra costar con la ayuda del seguro (puede ser ms barato con su seguro), pero el sitio web puede darle el precio si no utiliz Tourist information centre manager.  - Puede imprimir el cupn correspondiente y llevarlo con su receta a la farmacia.  - Tambin puede pasar por nuestra oficina durante el horario de atencin regular y Education officer, museum una tarjeta de cupones de GoodRx.  - Si necesita que su receta se enve electrnicamente a una farmacia diferente, informe a nuestra oficina a travs de MyChart de [place] o por telfono llamando al [phone] y presione la opcin 4.

## 2023-07-24 ENCOUNTER — Encounter: Payer: Self-pay | Admitting: Dermatology

## 2023-08-02 ENCOUNTER — Other Ambulatory Visit: Payer: Self-pay

## 2023-08-18 ENCOUNTER — Other Ambulatory Visit: Payer: Self-pay

## 2023-09-11 NOTE — Progress Notes (Unsigned)
Cardiology Office Note  Date:  09/12/2023   ID:  Wayne Flowers, DOB 05/13/1976, MRN [id number]  PCP:  Alba Cory, MD   Chief Complaint  Patient presents with   12 month follow up     "Doing well." Medications reviewed by the patient verbally.     HPI:  Wayne Flowers is a 47 year old gentleman with past medical history of Diabetes type 2 Hypertension Hyperlipidemia Non-smoker Cardiac CTA November 2022 calcium score 0, no coronary disease Who presents for follow-up of his angina/chest pain symptoms, cardiac risk factors  Last seen by myself in clinic October 2022 Active works in the Civil engineer, contracting, helps run [employer] more, eating less carbs A1c much improved over the past year or 2  Denies chest pain concerning for angina Remains on lisinopril 10, blood pressure well-controlled  Lab work reviewed A1c 6.4 down from 7.6 down from 8.3 Total cholesterol 126 LDL 52  EKG personally reviewed by myself on todays visit EKG Interpretation Date/Time:  Monday September 12 2023 09:59:32 EDT Ventricular Rate:  86 PR Interval:  164 QRS Duration:  94 QT Interval:  344 QTC Calculation: 411 R Axis:   28  Text Interpretation: Normal sinus rhythm Inferior infarct , age undetermined When compared with ECG of 18-Sep-2021 13:28, No significant change was found Confirmed by Julien Nordmann [phone]) on 09/12/2023 10:01:58 AM    emergency room September 18, 2021 for chest pain Cardiac enzymes negative, EKG with abnormality and was referred to our office    PMH:   has a past medical history of Allergy, Atelectasis, Chronic kidney disease, ED (erectile dysfunction), ED (erectile dysfunction), GERD (gastroesophageal reflux disease), Hearing loss, History of renal calculi (02/19/2013), Hypertension, Kidney stone (02/19/2013), Metabolic syndrome, Morbid obesity (HCC) (09/08/2017), Obesity, Class I, BMI 30.0-34.9 (see actual BMI), Pleurisy, PONV (postoperative nausea  and vomiting), Sleep apnea, and Type 2 diabetes mellitus (HCC) (06/23/2016).  PSH:    Past Surgical History:  Procedure Laterality Date   COLONOSCOPY WITH PROPOFOL N/A 04/21/2020   Procedure: COLONOSCOPY WITH PROPOFOL;  Surgeon: Midge Minium, MD;  Location[facility];  Service: Endoscopy;  Laterality: N/A;   CYSTOSCOPY W/ RETROGRADES Left 09/16/2015   Procedure: CYSTOSCOPY WITH RETROGRADE PYELOGRAM;  Surgeon: Orson Ape, MD;  Location: [facility];  Service: Urology;  Laterality: Left;   CYSTOSCOPY WITH URETEROSCOPY AND STENT PLACEMENT     EXTRACORPOREAL SHOCK WAVE LITHOTRIPSY Left 03/26/2021   Procedure: EXTRACORPOREAL SHOCK WAVE LITHOTRIPSY (ESWL);  Surgeon: Orson Ape, MD;  Location: [facility];  Service: Urology;  Laterality: Left;   POLYPECTOMY  04/21/2020   Procedure: POLYPECTOMY;  Surgeon: Midge Minium, MD;  Location: [facility];  Service: Endoscopy;;   URETEROSCOPY WITH HOLMIUM LASER LITHOTRIPSY Left 09/16/2015   Procedure: URETEROSCOPY WITH HOLMIUM LASER LITHOTRIPSY;  Surgeon: Orson Ape, MD;  Location: [facility];  Service: Urology;  Laterality: Left;   VASECTOMY      Current Outpatient Medications  Medication Sig Dispense Refill   atorvastatin (LIPITOR) 40 MG tablet Take 1 tablet (40 mg total) by mouth at bedtime. 90 tablet 0   Continuous Glucose Sensor (FREESTYLE LIBRE 3 SENSOR) MISC 1 each by Does not apply route every 14 (fourteen) days. Place 1 sensor on the skin every 14 days. Use to check glucose continuously 6 each 1   famotidine (PEPCID) 20 MG tablet Take 1 tablet (20 mg total) by mouth 2 (two) times daily as needed for heartburn or indigestion. 180 tablet 3   fluticasone (  FLONASE) 50 MCG/ACT nasal spray Place 2 sprays into both nostrils daily. 48 g 0   ketoconazole (NIZORAL) 2 % shampoo Apply 1 Application topically 3 (three) times a week. Wash scalp 3 times weekly for 1 month, then once a week for maintenance, let sit 5 minutes before rinsing 120  mL 5   lisinopril (ZESTRIL) 10 MG tablet Take 1 tablet (10 mg total) by mouth daily. 90 tablet 0   pioglitazone (ACTOS) 15 MG tablet Take 1 tablet (15 mg total) by mouth daily. 90 tablet 0   tadalafil (CIALIS) 5 MG tablet Take 1 tablet (5 mg total) by mouth daily. 90 tablet 0   tirzepatide (MOUNJARO) 5 MG/0.5ML Pen Inject 5 mg into the skin once a week. 6 mL 0   No current facility-administered medications for this visit.    Allergies:   Omeprazole and Metformin and related   Social History:  The patient  reports that he has never smoked. He has never used smokeless tobacco. He reports current alcohol use. He reports that he does not use drugs.   Family History:   family history includes Appendicitis in his son; Cancer in his father, maternal grandfather, and paternal grandmother; Colon cancer in his paternal grandmother; Diabetes in his mother and sister; Spina bifida in his sister.    Review of Systems: Review of Systems  Constitutional: Negative.   HENT: Negative.    Respiratory: Negative.    Cardiovascular: Negative.   Gastrointestinal: Negative.   Musculoskeletal: Negative.   Neurological: Negative.   Psychiatric/Behavioral: Negative.    All other systems reviewed and are negative.    PHYSICAL EXAM: VS:  BP 124/70 (BP Location: Left Arm, Patient Position: Sitting, Cuff Size: Normal)   Pulse 86   Ht 5\' 9"  (1.753 m)   Wt 242 lb 6 oz (109.9 kg)   SpO2 97%   BMI 35.79 kg/m  , BMI Body mass index is 35.79 kg/m. Constitutional:  oriented to person, place, and time. No distress.  HENT:  Head: Grossly normal Eyes:  no discharge. No scleral icterus.  Neck: No JVD, no carotid bruits  Cardiovascular: Regular rate and rhythm, no murmurs appreciated Pulmonary/Chest: Clear to auscultation bilaterally, no wheezes or rails Abdominal: Soft.  no distension.  no tenderness.  Musculoskeletal: Normal range of motion Neurological:  normal muscle tone. Coordination normal. No  atrophy Skin: Skin warm and dry Psychiatric: normal affect, pleasant  Recent Labs: 11/24/2022: ALT 35; BUN 16; Creat 0.94; Potassium 4.1; Sodium 141    Lipid Panel Lab Results  Component Value Date   CHOL 126 11/24/2022   HDL 48 11/24/2022   LDLCALC 52 11/24/2022   TRIG 182 (H) 11/24/2022      Wt Readings from Last 3 Encounters:  09/12/23 242 lb 6 oz (109.9 kg)  06/08/23 235 lb 6.4 oz (106.8 kg)  02/23/23 240 lb 6.4 oz (109 kg)     ASSESSMENT AND PLAN:  Problem List Items Addressed This Visit   None Visit Diagnoses     Chest pain of uncertain etiology    -  Primary   Relevant Orders   EKG 12-Lead (Completed)   Nonspecific abnormal electrocardiogram (ECG) (EKG)       Relevant Orders   EKG 12-Lead (Completed)       Chest pain/angina Denies chest pain on exertion Abnormal EKG 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 No changes to the medications were made.    Signed, Dossie Arbour, M.D., Ph.D. [facility] [place], [place] [phone]

## 2023-09-12 ENCOUNTER — Other Ambulatory Visit: Payer: Self-pay

## 2023-09-12 ENCOUNTER — Other Ambulatory Visit: Payer: Self-pay | Admitting: Family Medicine

## 2023-09-12 ENCOUNTER — Encounter: Payer: Self-pay | Admitting: Cardiovascular Disease

## 2023-09-12 ENCOUNTER — Ambulatory Visit: Payer: 59 | Attending: Cardiovascular Disease | Admitting: Cardiovascular Disease

## 2023-09-12 VITALS — BP 124/70 | HR 86 | Ht 69.0 in | Wt 242.4 lb

## 2023-09-12 DIAGNOSIS — E785 Hyperlipidemia, unspecified: Secondary | ICD-10-CM

## 2023-09-12 DIAGNOSIS — R9431 Abnormal electrocardiogram [ECG] [EKG]: Secondary | ICD-10-CM

## 2023-09-12 DIAGNOSIS — R079 Chest pain, unspecified: Secondary | ICD-10-CM

## 2023-09-12 MED FILL — Continuous Glucose System Sensor: 84 days supply | Qty: 6 | Fill #0 | Status: AC

## 2023-09-12 MED FILL — Tirzepatide Soln Auto-injector 5 MG/0.5ML: SUBCUTANEOUS | 28 days supply | Qty: 2 | Fill #0 | Status: AC

## 2023-09-12 NOTE — Patient Instructions (Addendum)
Medication Instructions:  No changes  If you need a refill on your cardiac medications before your next appointment, please call your pharmacy.   Lab work: No new labs needed  Testing/Procedures: No new testing needed  Follow-Up: At [facility], you and your health needs are our priority.  As part of our continuing mission to provide you with exceptional heart care, we have created designated Provider Care Teams.  These Care Teams include your primary Cardiologist (physician) and Advanced Practice Providers (APPs -  Physician Assistants and Nurse Practitioners) who all work together to provide you with the care you need, when you need it.  You will need a follow up appointment in 2 years.   Providers on your designated Care Team:   Nicolasa Ducking, NP Eula Listen, PA-C Cadence Fransico [place], [place]  COVID-19 Vaccine Information can be found at: [email or web address] For questions related to vaccine distribution or appointments, please email [email or web address] or call [phone].

## 2023-09-13 ENCOUNTER — Other Ambulatory Visit: Payer: Self-pay

## 2023-09-20 ENCOUNTER — Other Ambulatory Visit: Payer: Self-pay | Admitting: Family Medicine

## 2023-09-20 DIAGNOSIS — N529 Male erectile dysfunction, unspecified: Secondary | ICD-10-CM

## 2023-09-21 ENCOUNTER — Other Ambulatory Visit: Payer: Self-pay

## 2023-09-21 MED ORDER — TADALAFIL 5 MG PO TABS
5.0000 mg | ORAL_TABLET | Freq: Every day | ORAL | 0 refills | Status: DC
Start: 2023-09-21 — End: 2023-10-05

## 2023-09-29 ENCOUNTER — Other Ambulatory Visit: Payer: Self-pay | Admitting: Family Medicine

## 2023-09-29 DIAGNOSIS — E1169 Type 2 diabetes mellitus with other specified complication: Secondary | ICD-10-CM

## 2023-09-29 DIAGNOSIS — E785 Hyperlipidemia, unspecified: Secondary | ICD-10-CM

## 2023-09-29 DIAGNOSIS — I10 Essential (primary) hypertension: Secondary | ICD-10-CM

## 2023-09-30 ENCOUNTER — Other Ambulatory Visit: Payer: Self-pay

## 2023-09-30 MED ORDER — PIOGLITAZONE HCL 15 MG PO TABS
15.0000 mg | ORAL_TABLET | Freq: Every day | ORAL | 0 refills | Status: DC
  Filled 2023-09-30: qty 30, 30d supply, fill #0

## 2023-09-30 MED ORDER — ATORVASTATIN CALCIUM 40 MG PO TABS
40.0000 mg | ORAL_TABLET | Freq: Every day | ORAL | 0 refills | Status: DC
  Filled 2023-09-30: qty 30, 30d supply, fill #0
  Filled 2023-11-02: qty 30, 30d supply, fill #1
  Filled 2023-12-01: qty 30, 30d supply, fill #2

## 2023-09-30 MED ORDER — LISINOPRIL 10 MG PO TABS
10.0000 mg | ORAL_TABLET | Freq: Every day | ORAL | 0 refills | Status: DC
  Filled 2023-09-30: qty 30, 30d supply, fill #0

## 2023-09-30 NOTE — Telephone Encounter (Signed)
Requested Prescriptions  Pending Prescriptions Disp Refills   pioglitazone (ACTOS) 15 MG tablet 90 tablet 0    Sig: Take 1 tablet (15 mg total) by mouth daily.     Endocrinology:  Diabetes - Glitazones - pioglitazone Passed - 09/29/2023  6:04 PM      Passed - HBA1C is between 0 and 7.9 and within 180 days    Hemoglobin A1C  Date Value Ref Range Status  06/08/2023 6.4 (A) 4.0 - 5.6 % Final   Hgb A1c MFr Bld  Date Value Ref Range Status  02/16/2022 7.3 (H) <5.7 % of total Hgb Final    Comment:    For someone without known diabetes, a hemoglobin A1c value of 6.5% or greater indicates that they may have  diabetes and this should be confirmed with a follow-up  test. . For someone with known diabetes, a value <7% indicates  that their diabetes is well controlled and a value  greater than or equal to 7% indicates suboptimal  control. A1c targets should be individualized based on  duration of diabetes, age, comorbid conditions, and  other considerations. . Currently, no consensus exists regarding use of hemoglobin A1c for diagnosis of diabetes for children. Verna Czech - Valid encounter within last 6 months    Recent Outpatient Visits           3 months ago Dyslipidemia due to type 2 diabetes mellitus [facility])   [place] [facility] [place], Danna Hefty, MD   7 months ago Dyslipidemia due to type 2 diabetes mellitus [facility])   [place] [facility] [place], Danna Hefty, MD   10 months ago Dyslipidemia due to type 2 diabetes mellitus [facility])   [place] [facility] Alba Cory, MD   1 year ago Dyslipidemia due to type 2 diabetes mellitus [facility])   [place] [facility] Alba Cory, MD   1 year ago Dyslipidemia due to type 2 diabetes mellitus [facility])   [place] [facility] Alba Cory, MD       Future Appointments             In 5 days Alba Cory, MD [facility]   In 10 months Deirdre Evener, MD [facility]             lisinopril (ZESTRIL) 10 MG tablet 90 tablet 0    Sig: Take 1 tablet (10 mg total) by mouth daily.     Cardiovascular:  ACE Inhibitors Failed - 09/29/2023  6:04 PM      Failed - Cr in normal range and within 180 days    Creat  Date Value Ref Range Status  11/24/2022 0.94 0.60 - 1.29 mg/dL Final   Creatinine, Urine  Date Value Ref Range Status  06/08/2023 219 20 - 320 mg/dL Final         Failed - K in normal range and within 180 days    Potassium  Date Value Ref Range Status  11/24/2022 4.1 3.5 - 5.3 mmol/L Final         Passed - Patient is not pregnant      Passed - Last BP in normal range    BP Readings from Last 1 Encounters:  09/12/23 124/70         Passed - Valid encounter within last 6 months    Recent Outpatient Visits  3 months ago Dyslipidemia due to type 2 diabetes mellitus [facility])   [place] [facility] [place], Danna Hefty, MD   7 months ago Dyslipidemia due to type 2 diabetes mellitus [facility])   [place] [facility] [place], Danna Hefty, MD   10 months ago Dyslipidemia due to type 2 diabetes mellitus [facility])   [place] [facility] Alba Cory, MD   1 year ago Dyslipidemia due to type 2 diabetes mellitus [facility])   [place] [facility] Alba Cory, MD   1 year ago Dyslipidemia due to type 2 diabetes mellitus [facility])   [place] [facility] Alba Cory, MD       Future Appointments             In 5 days Alba Cory, MD [facility]   In 10 months Deirdre Evener, MD [facility]             atorvastatin (LIPITOR) 40 MG tablet 90 tablet 0    Sig: Take 1 tablet (40 mg total) by mouth at bedtime.     Cardiovascular:  Antilipid - Statins Failed - 09/29/2023  6:04 PM      Failed - Lipid Panel in normal range  within the last 12 months    Cholesterol, Total  Date Value Ref Range Status  06/16/2015 200 (H) 100 - 199 mg/dL Final   Cholesterol  Date Value Ref Range Status  11/24/2022 126 <200 mg/dL Final   LDL Cholesterol (Calc)  Date Value Ref Range Status  11/24/2022 52 mg/dL (calc) Final    Comment:    Reference range: <100 . Desirable range <100 mg/dL for primary prevention;   <70 mg/dL for patients with CHD or diabetic patients  with > or = 2 CHD risk factors. Marland Kitchen LDL-C is now calculated using the Martin-Hopkins  calculation, which is a validated novel method providing  better accuracy than the Friedewald equation in the  estimation of LDL-C.  Horald Pollen et al. Lenox Ahr. 4034;742(59): 2061-2068  (http://education.QuestDiagnostics.com/faq/FAQ164)    HDL  Date Value Ref Range Status  11/24/2022 48 > OR = 40 mg/dL Final  56/38/7564 47 >33 mg/dL Final    Comment:    According to ATP-III Guidelines, HDL-C >59 mg/dL is considered a negative risk factor for CHD.    Triglycerides  Date Value Ref Range Status  11/24/2022 182 (H) <150 mg/dL Final         Passed - Patient is not pregnant      Passed - Valid encounter within last 12 months    Recent Outpatient Visits           3 months ago Dyslipidemia due to type 2 diabetes mellitus [facility])   [place] [facility] [place], Danna Hefty, MD   7 months ago Dyslipidemia due to type 2 diabetes mellitus [facility])   [place] [facility] [place], Danna Hefty, MD   10 months ago Dyslipidemia due to type 2 diabetes mellitus [facility])   [place] [facility] Alba Cory, MD   1 year ago Dyslipidemia due to type 2 diabetes mellitus [facility])   [place] [facility] Alba Cory, MD   1 year ago Dyslipidemia due to type 2 diabetes mellitus [facility])   [place] [facility] Alba Cory, MD       Future Appointments             In 5 days [place],  Danna Hefty, MD [facility], [facility]   In 10 months Deirdre Evener, MD [facility]

## 2023-10-04 NOTE — Progress Notes (Unsigned)
Name: Wayne Flowers   MRN: [id number]    DOB: 1976/08/06   Date:10/05/2023       Progress Note  Subjective  Chief Complaint  Follow Up  HPI  DM type II: he was diagnosed about 10 years ago, he has tried Metformin but caused diarrhea, Jardiance caused urinary frequently . He was on Trulicity  and Actos but current on Mounjarno 5 mg Summer 2024 . He  has been using CGM and glucose has been in range 78  % of the time in the past 90 days, he states not as compliant with his diet due to travelling . Marland Kitchen He has dyslipidemia and is on statin therapy, he takes Cialis 5 mg daily and is working well for him. He denies polyphagia, polydipsia or polyuria. He wants to resume a healthier diet before going up on dose of Mounjaro  HTN: he takes ACE and bp is at goal, denies chest pain or palpitation . BP is at goal . He had a cardiac calcium score done and is now going back to see cardiologist every 3 years   Dyslipidemia: he is on Atorvastatin , LDL is at goal and we will continue current dose of medication . He denies side effects , we will check labs next visit   GERD: doing well on Pepcid Unchanged   Fatty liver : eating healthier, weight is down 5 lbs  and he takes Actos  OSA: not wearing CPAP,  he could not tolerate it  Unchanged   Patient Active Problem List   Diagnosis Date Noted   Polyp of descending colon    History of small bowel obstruction 01/25/2020   Hearing loss 03/11/2017   Hyperlipidemia LDL goal <70 07/19/2016   Allergic rhinitis with postnasal drip 12/22/2015   OSA (obstructive sleep apnea) 06/16/2015   Hypertension goal BP (blood pressure) < 140/90 06/16/2015   Gastroesophageal reflux disease 06/16/2015   Stiffness of right shoulder joint 06/16/2015   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   Low libido 08/22/2013    Past Surgical History:  Procedure Laterality Date   COLONOSCOPY WITH PROPOFOL N/A 04/21/2020   Procedure: COLONOSCOPY WITH PROPOFOL;   Surgeon: Midge Minium, MD;  Location: [facility];  Service: Endoscopy;  Laterality: N/A;   CYSTOSCOPY W/ RETROGRADES Left 09/16/2015   Procedure: CYSTOSCOPY WITH RETROGRADE PYELOGRAM;  Surgeon: Orson Ape, MD;  Location: [facility];  Service: Urology;  Laterality: Left;   CYSTOSCOPY WITH URETEROSCOPY AND STENT PLACEMENT     EXTRACORPOREAL SHOCK WAVE LITHOTRIPSY Left 03/26/2021   Procedure: EXTRACORPOREAL SHOCK WAVE LITHOTRIPSY (ESWL);  Surgeon: Orson Ape, MD;  Location: [facility];  Service: Urology;  Laterality: Left;   POLYPECTOMY  04/21/2020   Procedure: POLYPECTOMY;  Surgeon: Midge Minium, MD;  Location[facility];  Service: Endoscopy;;   URETEROSCOPY WITH HOLMIUM LASER LITHOTRIPSY Left 09/16/2015   Procedure: URETEROSCOPY WITH HOLMIUM LASER LITHOTRIPSY;  Surgeon: Orson Ape, MD;  Location: [facility];  Service: Urology;  Laterality: Left;   VASECTOMY      Family History  Problem Relation Age of Onset   Diabetes Mother    Cancer Father        renal   Spina bifida Sister    Appendicitis Son    Cancer Maternal Grandfather        black lung   Cancer Paternal Grandmother        lung   Colon cancer Paternal Grandmother    Diabetes Sister  Social History   Tobacco Use   Smoking status: Never   Smokeless tobacco: Never  Substance Use Topics   Alcohol use: Yes    Alcohol/week: 0.0 - 1.0 standard drinks of alcohol    Comment: rarley     Current Outpatient Medications:    atorvastatin (LIPITOR) 40 MG tablet, Take 1 tablet (40 mg total) by mouth at bedtime., Disp: 90 tablet, Rfl: 0   Continuous Glucose Sensor (FREESTYLE LIBRE 3 SENSOR) MISC, 1 each by Does not apply route every 14 (fourteen) days. Place 1 sensor on the skin every 14 days. Use to check glucose continuously, Disp: 6 each, Rfl: 1   famotidine (PEPCID) 20 MG tablet, Take 1 tablet (20 mg total) by mouth 2 (two) times daily as needed for heartburn or indigestion., Disp: 180 tablet, Rfl:  3   fluticasone (FLONASE) 50 MCG/ACT nasal spray, Place 2 sprays into both nostrils daily., Disp: 48 g, Rfl: 0   ketoconazole (NIZORAL) 2 % shampoo, Apply 1 Application topically 3 (three) times a week. Wash scalp 3 times weekly for 1 month, then once a week for maintenance, let sit 5 minutes before rinsing, Disp: 120 mL, Rfl: 5   lisinopril (ZESTRIL) 10 MG tablet, Take 1 tablet (10 mg total) by mouth daily., Disp: 90 tablet, Rfl: 0   pioglitazone (ACTOS) 15 MG tablet, Take 1 tablet (15 mg total) by mouth daily., Disp: 90 tablet, Rfl: 0   tadalafil (CIALIS) 5 MG tablet, Take 1 tablet (5 mg total) by mouth daily., Disp: 30 tablet, Rfl: 0   tirzepatide (MOUNJARO) 5 MG/0.5ML Pen, Inject 5 mg into the skin once a week., Disp: 6 mL, Rfl: 0  Allergies  Allergen Reactions   Omeprazole Other (See Comments)    Reaction:  Memory loss    Metformin And Related Diarrhea    I personally reviewed active problem list, medication list, allergies, family history, social history, health maintenance with the patient/caregiver today.   ROS  Ten systems reviewed and is negative except as mentioned in HPI    Objective  Vitals:   10/05/23 0817  BP: 124/70  Pulse: 94  Resp: 16  Temp: 98.3 F (36.8 C)  TempSrc: Oral  SpO2: 98%  Weight: 242 lb (109.8 kg)  Height: 5\' 9"  (1.753 m)    Body mass index is 35.74 kg/m.  Physical Exam  Constitutional: Patient appears well-developed and well-nourished. Obese  No distress.  HEENT: head atraumatic, normocephalic, pupils equal and reactive to light, neck supple, throat within normal limits Cardiovascular: Normal rate, regular rhythm and normal heart sounds.  No murmur heard. No BLE edema. Pulmonary/Chest: Effort normal and breath sounds normal. No respiratory distress. Abdominal: Soft.  There is no tenderness. Psychiatric: Patient has a normal mood and affect. behavior is normal. Judgment and thought content normal.   Recent Results (from the past 2160  hour(s))  POCT HgB A1C     Status: Abnormal   Collection Time: 10/05/23  8:20 AM  Result Value Ref Range   Hemoglobin A1C 6.7 (A) 4.0 - 5.6 %   HbA1c POC (<> result, manual entry)     HbA1c, POC (prediabetic range)     HbA1c, POC (controlled diabetic range)       PHQ2/9:    10/05/2023    8:18 AM 06/08/2023    3:28 PM 02/23/2023    1:49 PM 11/24/2022    2:56 PM 05/25/2022    2:33 PM  Depression screen PHQ 2/9  Decreased Interest 0 0 0  0 0  Down, Depressed, Hopeless 0 0 0 0 0  PHQ - 2 Score 0 0 0 0 0  Altered sleeping 0 0 0 0   Tired, decreased energy 0 0 0 0   Change in appetite 0 0 0 0   Feeling bad or failure about yourself  0 0 0 0   Trouble concentrating 0 0 0 0   Moving slowly or fidgety/restless 0 0 0 0   Suicidal thoughts 0 0 0 0   PHQ-9 Score 0 0 0 0     phq 9 is negative   Fall Risk:    10/05/2023    8:18 AM 06/08/2023    3:28 PM 02/23/2023    1:49 PM 11/24/2022    2:56 PM 05/25/2022    2:33 PM  Fall Risk   Falls in the past year? 0 0 0 0 0  Number falls in past yr:     0  Injury with Fall?     0  Risk for fall due to : No Fall Risks No Fall Risks No Fall Risks No Fall Risks No Fall Risks  Follow up Falls prevention discussed Falls prevention discussed Falls prevention discussed Falls prevention discussed;Education provided;Falls evaluation completed Falls prevention discussed     Assessment & Plan  1. Dyslipidemia due to type 2 diabetes mellitus (HCC)  - POCT HgB A1C - tirzepatide (MOUNJARO) 5 MG/0.5ML Pen; Inject 5 mg into the skin once a week.  Dispense: 6 mL; Refill: 0 - pioglitazone (ACTOS) 15 MG tablet; Take 1 tablet (15 mg total) by mouth daily.  Dispense: 90 tablet; Refill: 0  2. Need for immunization against influenza  - Flu vaccine trivalent PF, 6mos and older(Flulaval,Afluria,Fluarix,Fluzone)  3. ED (erectile dysfunction) of organic origin  - tadalafil (CIALIS) 5 MG tablet; Take 1 tablet (5 mg total) by mouth daily.  Dispense: 90 tablet;  Refill: 0  4. Hypertension goal BP (blood pressure) < 140/90  - lisinopril (ZESTRIL) 10 MG tablet; Take 1 tablet (10 mg total) by mouth daily.  Dispense: 90 tablet; Refill: 0  5. Gastroesophageal reflux disease, unspecified whether esophagitis present  - famotidine (PEPCID) 20 MG tablet; Take 1 tablet (20 mg total) by mouth 2 (two) times daily as needed.  Dispense: 180 tablet; Refill: 1  6. Morbid obesity (HCC)  He wants to hold off on going up on dose of Mounjaro

## 2023-10-05 ENCOUNTER — Ambulatory Visit: Payer: 59 | Admitting: Family Medicine

## 2023-10-05 ENCOUNTER — Encounter: Payer: Self-pay | Admitting: Family Medicine

## 2023-10-05 ENCOUNTER — Other Ambulatory Visit: Payer: Self-pay

## 2023-10-05 VITALS — BP 124/70 | HR 94 | Temp 98.3°F | Resp 16 | Ht 69.0 in | Wt 242.0 lb

## 2023-10-05 DIAGNOSIS — I10 Essential (primary) hypertension: Secondary | ICD-10-CM

## 2023-10-05 DIAGNOSIS — E785 Hyperlipidemia, unspecified: Secondary | ICD-10-CM

## 2023-10-05 DIAGNOSIS — E1169 Type 2 diabetes mellitus with other specified complication: Secondary | ICD-10-CM | POA: Diagnosis not present

## 2023-10-05 DIAGNOSIS — Z23 Encounter for immunization: Secondary | ICD-10-CM

## 2023-10-05 DIAGNOSIS — N529 Male erectile dysfunction, unspecified: Secondary | ICD-10-CM

## 2023-10-05 DIAGNOSIS — K219 Gastro-esophageal reflux disease without esophagitis: Secondary | ICD-10-CM

## 2023-10-05 LAB — POCT GLYCOSYLATED HEMOGLOBIN (HGB A1C): Hemoglobin A1C: 6.7 % — AB (ref 4.0–5.6)

## 2023-10-05 MED ORDER — MOUNJARO 5 MG/0.5ML ~~LOC~~ SOAJ
5.0000 mg | SUBCUTANEOUS | 0 refills | Status: DC
  Filled 2023-10-05: qty 2, 28d supply, fill #0
  Filled 2023-11-10: qty 2, 28d supply, fill #1
  Filled 2023-12-13: qty 2, 28d supply, fill #2

## 2023-10-05 MED ORDER — FAMOTIDINE 20 MG PO TABS
20.0000 mg | ORAL_TABLET | Freq: Two times a day (BID) | ORAL | 1 refills | Status: DC | PRN
  Filled 2023-10-05: qty 60, 30d supply, fill #0
  Filled 2023-11-24: qty 60, 30d supply, fill #1
  Filled 2023-12-26: qty 60, 30d supply, fill #2

## 2023-10-05 MED ORDER — TADALAFIL 5 MG PO TABS: 5.0000 mg | ORAL_TABLET | Freq: Every day | ORAL | 0 refills | Status: DC

## 2023-10-05 MED ORDER — PIOGLITAZONE HCL 15 MG PO TABS
15.0000 mg | ORAL_TABLET | Freq: Every day | ORAL | 0 refills | Status: DC
  Filled 2023-10-05 – 2023-11-02 (×2): qty 30, 30d supply, fill #0
  Filled 2023-12-01: qty 30, 30d supply, fill #1
  Filled 2023-12-29: qty 30, 30d supply, fill #2

## 2023-10-05 MED ORDER — LISINOPRIL 10 MG PO TABS
10.0000 mg | ORAL_TABLET | Freq: Every day | ORAL | 0 refills | Status: DC
  Filled 2023-10-05 – 2023-11-02 (×2): qty 30, 30d supply, fill #0
  Filled 2023-12-01: qty 30, 30d supply, fill #1
  Filled 2023-12-29: qty 30, 30d supply, fill #2

## 2023-10-10 ENCOUNTER — Ambulatory Visit: Payer: 59 | Admitting: Family Medicine

## 2023-10-11 ENCOUNTER — Other Ambulatory Visit: Payer: Self-pay

## 2023-10-20 LAB — HM DIABETES EYE EXAM

## 2023-10-21 ENCOUNTER — Other Ambulatory Visit: Payer: Self-pay | Admitting: Family Medicine

## 2023-10-21 DIAGNOSIS — N529 Male erectile dysfunction, unspecified: Secondary | ICD-10-CM

## 2023-11-02 ENCOUNTER — Other Ambulatory Visit: Payer: Self-pay

## 2023-11-10 ENCOUNTER — Other Ambulatory Visit: Payer: Self-pay

## 2023-11-10 MED FILL — Continuous Glucose System Sensor: 84 days supply | Qty: 6 | Fill #1 | Status: AC

## 2023-11-24 ENCOUNTER — Other Ambulatory Visit: Payer: Self-pay

## 2023-12-02 ENCOUNTER — Other Ambulatory Visit: Payer: Self-pay

## 2023-12-26 ENCOUNTER — Other Ambulatory Visit: Payer: Self-pay

## 2023-12-26 ENCOUNTER — Other Ambulatory Visit: Payer: Self-pay | Admitting: Family Medicine

## 2023-12-26 DIAGNOSIS — E785 Hyperlipidemia, unspecified: Secondary | ICD-10-CM

## 2023-12-26 MED ORDER — ATORVASTATIN CALCIUM 40 MG PO TABS
40.0000 mg | ORAL_TABLET | Freq: Every day | ORAL | 0 refills | Status: DC
  Filled 2023-12-26: qty 30, 30d supply, fill #0

## 2024-01-02 ENCOUNTER — Other Ambulatory Visit: Payer: Self-pay

## 2024-01-10 ENCOUNTER — Other Ambulatory Visit: Payer: Self-pay | Admitting: Family Medicine

## 2024-01-10 ENCOUNTER — Ambulatory Visit: Payer: 59 | Admitting: Family Medicine

## 2024-01-10 ENCOUNTER — Other Ambulatory Visit: Payer: Self-pay

## 2024-01-10 DIAGNOSIS — E785 Hyperlipidemia, unspecified: Secondary | ICD-10-CM

## 2024-01-11 ENCOUNTER — Other Ambulatory Visit: Payer: Self-pay

## 2024-01-11 ENCOUNTER — Encounter: Payer: Self-pay | Admitting: Family Medicine

## 2024-01-11 ENCOUNTER — Ambulatory Visit: Payer: 59 | Admitting: Family Medicine

## 2024-01-11 VITALS — BP 122/74 | HR 84 | Temp 97.8°F | Resp 16 | Ht 69.0 in | Wt 234.9 lb

## 2024-01-11 DIAGNOSIS — E66811 Obesity, class 1: Secondary | ICD-10-CM

## 2024-01-11 DIAGNOSIS — Z7984 Long term (current) use of oral hypoglycemic drugs: Secondary | ICD-10-CM

## 2024-01-11 DIAGNOSIS — J309 Allergic rhinitis, unspecified: Secondary | ICD-10-CM

## 2024-01-11 DIAGNOSIS — E785 Hyperlipidemia, unspecified: Secondary | ICD-10-CM

## 2024-01-11 DIAGNOSIS — K219 Gastro-esophageal reflux disease without esophagitis: Secondary | ICD-10-CM

## 2024-01-11 DIAGNOSIS — I10 Essential (primary) hypertension: Secondary | ICD-10-CM | POA: Diagnosis not present

## 2024-01-11 DIAGNOSIS — E1169 Type 2 diabetes mellitus with other specified complication: Secondary | ICD-10-CM

## 2024-01-11 DIAGNOSIS — Z23 Encounter for immunization: Secondary | ICD-10-CM | POA: Diagnosis not present

## 2024-01-11 DIAGNOSIS — Z6834 Body mass index (BMI) 34.0-34.9, adult: Secondary | ICD-10-CM

## 2024-01-11 DIAGNOSIS — R0982 Postnasal drip: Secondary | ICD-10-CM

## 2024-01-11 DIAGNOSIS — N529 Male erectile dysfunction, unspecified: Secondary | ICD-10-CM

## 2024-01-11 LAB — POCT GLYCOSYLATED HEMOGLOBIN (HGB A1C): Hemoglobin A1C: 7.1 % — AB (ref 4.0–5.6)

## 2024-01-11 MED ORDER — FREESTYLE LIBRE 3 SENSOR MISC
1.0000 | 1 refills | Status: DC
  Filled 2024-01-11 – 2024-01-26 (×2): qty 6, 84d supply, fill #0

## 2024-01-11 MED ORDER — FAMOTIDINE 20 MG PO TABS
20.0000 mg | ORAL_TABLET | Freq: Two times a day (BID) | ORAL | 1 refills | Status: DC | PRN
  Filled 2024-01-11: qty 180, 90d supply, fill #0
  Filled 2024-01-26: qty 60, 30d supply, fill #0
  Filled 2024-02-26: qty 60, 30d supply, fill #1
  Filled 2024-03-29: qty 60, 30d supply, fill #2
  Filled 2024-04-29: qty 60, 30d supply, fill #3
  Filled 2024-05-31: qty 60, 30d supply, fill #4
  Filled 2024-07-02: qty 60, 30d supply, fill #5

## 2024-01-11 MED ORDER — LISINOPRIL 10 MG PO TABS
10.0000 mg | ORAL_TABLET | Freq: Every day | ORAL | 0 refills | Status: DC
  Filled 2024-01-11: qty 90, 90d supply, fill #0
  Filled 2024-01-26: qty 30, 30d supply, fill #0
  Filled 2024-02-26: qty 30, 30d supply, fill #1
  Filled 2024-03-27: qty 30, 30d supply, fill #2

## 2024-01-11 MED ORDER — PIOGLITAZONE HCL 15 MG PO TABS
15.0000 mg | ORAL_TABLET | Freq: Every day | ORAL | 0 refills | Status: DC
Start: 2024-01-11 — End: 2024-04-09
  Filled 2024-01-11: qty 90, 90d supply, fill #0
  Filled 2024-01-26: qty 30, 30d supply, fill #0
  Filled 2024-02-26: qty 30, 30d supply, fill #1
  Filled 2024-03-27: qty 30, 30d supply, fill #2

## 2024-01-11 MED ORDER — TIRZEPATIDE 7.5 MG/0.5ML ~~LOC~~ SOAJ
7.5000 mg | SUBCUTANEOUS | 0 refills | Status: DC
  Filled 2024-01-11: qty 2, 28d supply, fill #0
  Filled 2024-02-06: qty 2, 28d supply, fill #1
  Filled 2024-03-06: qty 2, 28d supply, fill #2

## 2024-01-11 MED ORDER — TADALAFIL 5 MG PO TABS: 5.0000 mg | ORAL_TABLET | Freq: Every day | ORAL | 0 refills | Status: DC

## 2024-01-11 MED ORDER — FLUTICASONE PROPIONATE 50 MCG/ACT NA SUSP
2.0000 | Freq: Every day | NASAL | 0 refills | Status: DC
  Filled 2024-01-11: qty 16, 30d supply, fill #0
  Filled 2024-02-06: qty 16, 30d supply, fill #1
  Filled 2024-03-29: qty 16, 30d supply, fill #2

## 2024-01-11 MED ORDER — ATORVASTATIN CALCIUM 40 MG PO TABS
40.0000 mg | ORAL_TABLET | Freq: Every day | ORAL | 0 refills | Status: DC
Start: 2024-01-11 — End: 2024-04-09
  Filled 2024-01-11: qty 90, 90d supply, fill #0
  Filled 2024-01-26: qty 30, 30d supply, fill #0
  Filled 2024-02-26: qty 30, 30d supply, fill #1
  Filled 2024-03-27: qty 30, 30d supply, fill #2

## 2024-01-11 NOTE — Progress Notes (Signed)
 Name: Wayne Flowers   MRN: [id number]    DOB: 1976-02-23   Date:01/11/2024       Progress Note  Subjective  Chief Complaint  Chief Complaint  Patient presents with   Medical Management of Chronic Issues   HPI   DM type II: he was diagnosed about 10 years ago, he has tried Metformin but caused diarrhea, Jardiance caused urinary frequency . He was on Trulicity  and Actos but current on Mounjarno 5 mg Summer 2024, weight is down but A1C today is up at 7.1 %, he states he was not as compliant with his diet over the holidays but has resumed a high protein diet, low carbohydrates and going back to the gym . He  has been using CGM - FreeStyle Libre: 90 day average is 145 mg/Dl, fasting in the 664'Q He has dyslipidemia and is on statin therapy, he takes Cialis 5 mg daily and is working well for him. He denies polyphagia, polydipsia or polyuria.    HTN: he takes ACE and bp is at goal, denies chest pain or palpitation . BP is at goal . He had a cardiac calcium score done and is on a schedule visit with cardiologist every 3 years    Dyslipidemia: he is on Atorvastatin , LDL is at goal and we will continue current dose of medication . He denies side effects , recheck labs today    GERD: doing well on Pepcid Unchanged    Fatty liver : eating healthier, weight is down 7 lbs  and he is on Actos   OSA: not wearing CPAP,  he could not tolerate it  , he is losing weight    Patient Active Problem List   Diagnosis Date Noted   Polyp of descending colon    History of small bowel obstruction 01/25/2020   Hearing loss 03/11/2017   Hyperlipidemia LDL goal <70 07/19/2016   Allergic rhinitis with postnasal drip 12/22/2015   Morbid obesity (HCC) 06/16/2015   OSA (obstructive sleep apnea) 06/16/2015   Hypertension goal BP (blood pressure) < 140/90 06/16/2015   Gastroesophageal reflux disease 06/16/2015   Stiffness of right shoulder joint 06/16/2015   ED (erectile dysfunction) of organic origin  08/22/2013   Low libido 08/22/2013    Past Surgical History:  Procedure Laterality Date   COLONOSCOPY WITH PROPOFOL N/A 04/21/2020   Procedure: COLONOSCOPY WITH PROPOFOL;  Surgeon: Midge Minium, MD;  Location: [facility];  Service: Endoscopy;  Laterality: N/A;   CYSTOSCOPY W/ RETROGRADES Left 09/16/2015   Procedure: CYSTOSCOPY WITH RETROGRADE PYELOGRAM;  Surgeon: Orson Ape, MD;  Location: [facility];  Service: Urology;  Laterality: Left;   CYSTOSCOPY WITH URETEROSCOPY AND STENT PLACEMENT     EXTRACORPOREAL SHOCK WAVE LITHOTRIPSY Left 03/26/2021   Procedure: EXTRACORPOREAL SHOCK WAVE LITHOTRIPSY (ESWL);  Surgeon: Orson Ape, MD;  Location: [facility];  Service: Urology;  Laterality: Left;   POLYPECTOMY  04/21/2020   Procedure: POLYPECTOMY;  Surgeon: Midge Minium, MD;  Location: [facility];  Service: Endoscopy;;   URETEROSCOPY WITH HOLMIUM LASER LITHOTRIPSY Left 09/16/2015   Procedure: URETEROSCOPY WITH HOLMIUM LASER LITHOTRIPSY;  Surgeon: Orson Ape, MD;  Location: [facility];  Service: Urology;  Laterality: Left;   VASECTOMY      Family History  Problem Relation Age of Onset   Diabetes Mother    Cancer Father        renal   Spina bifida Sister    Appendicitis Son    Cancer Maternal  Grandfather        black lung   Cancer Paternal Grandmother        lung   Colon cancer Paternal Grandmother    Diabetes Sister     Social History   Tobacco Use   Smoking status: Never   Smokeless tobacco: Never  Substance Use Topics   Alcohol use: Yes    Alcohol/week: 0.0 - 1.0 standard drinks of alcohol    Comment: rarley     Current Outpatient Medications:    atorvastatin (LIPITOR) 40 MG tablet, Take 1 tablet (40 mg total) by mouth at bedtime., Disp: 90 tablet, Rfl: 0   Continuous Glucose Sensor (FREESTYLE LIBRE 3 SENSOR) MISC, 1 each by Does not apply route every 14 (fourteen) days. Place 1 sensor on the skin every 14 days. Use to check glucose continuously,  Disp: 6 each, Rfl: 1   famotidine (PEPCID) 20 MG tablet, Take 1 tablet (20 mg total) by mouth 2 (two) times daily as needed., Disp: 180 tablet, Rfl: 1   fluticasone (FLONASE) 50 MCG/ACT nasal spray, Place 2 sprays into both nostrils daily., Disp: 48 g, Rfl: 0   ketoconazole (NIZORAL) 2 % shampoo, Apply 1 Application topically 3 (three) times a week. Wash scalp 3 times weekly for 1 month, then once a week for maintenance, let sit 5 minutes before rinsing, Disp: 120 mL, Rfl: 5   lisinopril (ZESTRIL) 10 MG tablet, Take 1 tablet (10 mg total) by mouth daily., Disp: 90 tablet, Rfl: 0   pioglitazone (ACTOS) 15 MG tablet, Take 1 tablet (15 mg total) by mouth daily., Disp: 90 tablet, Rfl: 0   tadalafil (CIALIS) 5 MG tablet, Take 1 tablet (5 mg total) by mouth daily., Disp: 90 tablet, Rfl: 0   tirzepatide (MOUNJARO) 5 MG/0.5ML Pen, Inject 5 mg into the skin once a week., Disp: 6 mL, Rfl: 0  Allergies  Allergen Reactions   Omeprazole Other (See Comments)    Reaction:  Memory loss    Metformin And Related Diarrhea    I personally reviewed active problem list, medication list, allergies, family history with the patient/caregiver today.   ROS  Ten systems reviewed and is negative except as mentioned in HPI    Objective  Vitals:   01/11/24 1303  BP: 122/74  Pulse: 84  Resp: 16  Temp: 97.8 F (36.6 C)  TempSrc: Oral  SpO2: 97%  Weight: 234 lb 14.4 oz (106.5 kg)  Height: 5\' 9"  (1.753 m)    Body mass index is 34.69 kg/m.  Physical Exam  Constitutional: Patient appears well-developed and well-nourished. Obese  No distress.  HEENT: head atraumatic, normocephalic, pupils equal and reactive to light, neck supple Cardiovascular: Normal rate, regular rhythm and normal heart sounds.  No murmur heard. No BLE edema. Pulmonary/Chest: Effort normal and breath sounds normal. No respiratory distress. Abdominal: Soft.  There is no tenderness. Psychiatric: Patient has a normal mood and affect.  behavior is normal. Judgment and thought content normal.   Recent Results (from the past 2160 hours)  HM DIABETES EYE EXAM     Status: None   Collection Time: 10/20/23 11:30 AM  Result Value Ref Range   HM Diabetic Eye Exam No Retinopathy No Retinopathy    Comment: ABSTRACTED BY HIM  POCT glycosylated hemoglobin (Hb A1C)     Status: Abnormal   Collection Time: 01/11/24  1:09 PM  Result Value Ref Range   Hemoglobin A1C 7.1 (A) 4.0 - 5.6 %   HbA1c POC (<> result,  manual entry)     HbA1c, POC (prediabetic range)     HbA1c, POC (controlled diabetic range)      Diabetic Foot Exam:     PHQ2/9:    01/11/2024   12:57 PM 10/05/2023    8:18 AM 06/08/2023    3:28 PM 02/23/2023    1:49 PM 11/24/2022    2:56 PM  Depression screen PHQ 2/9  Decreased Interest 0 0 0 0 0  Down, Depressed, Hopeless 0 0 0 0 0  PHQ - 2 Score 0 0 0 0 0  Altered sleeping 0 0 0 0 0  Tired, decreased energy 0 0 0 0 0  Change in appetite 0 0 0 0 0  Feeling bad or failure about yourself  0 0 0 0 0  Trouble concentrating 0 0 0 0 0  Moving slowly or fidgety/restless 0 0 0 0 0  Suicidal thoughts 0 0 0 0 0  PHQ-9 Score 0 0 0 0 0  Difficult doing work/chores Not difficult at all        phq 9 is negative  Fall Risk:    01/11/2024   12:57 PM 10/05/2023    8:18 AM 06/08/2023    3:28 PM 02/23/2023    1:49 PM 11/24/2022    2:56 PM  Fall Risk   Falls in the past year? 0 0 0 0 0  Number falls in past yr: 0      Injury with Fall? 0      Risk for fall due to : No Fall Risks No Fall Risks No Fall Risks No Fall Risks No Fall Risks  Follow up Falls prevention discussed;Education provided;Falls evaluation completed Falls prevention discussed Falls prevention discussed Falls prevention discussed Falls prevention discussed;Education provided;Falls evaluation completed     Assessment & Plan  1. Dyslipidemia due to type 2 diabetes mellitus (HCC) (Primary)  - POCT glycosylated hemoglobin (Hb A1C) - tirzepatide (MOUNJARO)  7.5 MG/0.5ML Pen; Inject 7.5 mg into the skin once a week.  Dispense: 6 mL; Refill: 0 - pioglitazone (ACTOS) 15 MG tablet; Take 1 tablet (15 mg total) by mouth daily.  Dispense: 90 tablet; Refill: 0 - atorvastatin (LIPITOR) 40 MG tablet; Take 1 tablet (40 mg total) by mouth at bedtime.  Dispense: 90 tablet; Refill: 0 - Continuous Glucose Sensor (FREESTYLE LIBRE 3 SENSOR) MISC; 1 each by Does not apply route every 14 (fourteen) days. Place 1 sensor on the skin every 14 days. Use to check glucose continuously  Dispense: 6 each; Refill: 1 - Lipid panel - COMPLETE METABOLIC PANEL WITH GFR  2. Obesity (BMI 30.0-34.9)  Continue life style modification, increase dose of GLP-1 agonist since A1C is not at goal and may help with weight loss  3. Gastroesophageal reflux disease without esophagitis  - famotidine (PEPCID) 20 MG tablet; Take 1 tablet (20 mg total) by mouth 2 (two) times daily as needed.  Dispense: 180 tablet; Refill: 1  4. Hypertension goal BP (blood pressure) < 140/90  - lisinopril (ZESTRIL) 10 MG tablet; Take 1 tablet (10 mg total) by mouth daily.  Dispense: 90 tablet; Refill: 0 - CBC with Differential/Platelet  5. Allergic rhinitis with postnasal drip  - fluticasone (FLONASE) 50 MCG/ACT nasal spray; Place 2 sprays into both nostrils daily.  Dispense: 48 g; Refill: 0  6. ED (erectile dysfunction) of organic origin  - tadalafil (CIALIS) 5 MG tablet; Take 1 tablet (5 mg total) by mouth daily.  Dispense: 90 tablet; Refill: 0  7. Need  for vaccination for Strep pneumoniae  - Pneumococcal conjugate vaccine 20-valent (Prevnar 20)

## 2024-01-12 ENCOUNTER — Encounter: Payer: Self-pay | Admitting: Family Medicine

## 2024-01-12 LAB — COMPLETE METABOLIC PANEL WITH GFR
AG Ratio: 2 (calc) (ref 1.0–2.5)
ALT: 34 U/L (ref 9–46)
AST: 19 U/L (ref 10–40)
Albumin: 4.6 g/dL (ref 3.6–5.1)
Alkaline phosphatase (APISO): 61 U/L (ref 36–130)
BUN: 16 mg/dL (ref 7–25)
CO2: 29 mmol/L (ref 20–32)
Calcium: 9.5 mg/dL (ref 8.6–10.3)
Chloride: 105 mmol/L (ref 98–110)
Creat: 1.24 mg/dL (ref 0.60–1.29)
Globulin: 2.3 g/dL (ref 1.9–3.7)
Glucose, Bld: 121 mg/dL — ABNORMAL HIGH (ref 65–99)
Potassium: 4.4 mmol/L (ref 3.5–5.3)
Sodium: 142 mmol/L (ref 135–146)
Total Bilirubin: 0.4 mg/dL (ref 0.2–1.2)
Total Protein: 6.9 g/dL (ref 6.1–8.1)
eGFR: 71 mL/min/{1.73_m2} (ref >=60)

## 2024-01-12 LAB — CBC WITH DIFFERENTIAL/PLATELET
Absolute Lymphocytes: 2410 {cells}/uL (ref 850–3900)
Absolute Monocytes: 595 {cells}/uL (ref 200–950)
Basophils Absolute: 77 {cells}/uL (ref 0–200)
Basophils Relative: 0.8 %
Eosinophils Absolute: 106 {cells}/uL (ref 15–500)
Eosinophils Relative: 1.1 %
HCT: 42.9 % (ref 38.5–50.0)
Hemoglobin: 14.4 g/dL (ref 13.2–17.1)
MCH: 30.3 pg (ref 27.0–33.0)
MCHC: 33.6 g/dL (ref 32.0–36.0)
MCV: 90.1 fL (ref 80.0–100.0)
MPV: 9.8 fL (ref 7.5–12.5)
Monocytes Relative: 6.2 %
Neutro Abs: 6413 {cells}/uL (ref 1500–7800)
Neutrophils Relative %: 66.8 %
Platelets: 313 10*3/uL (ref 140–400)
RBC: 4.76 10*6/uL (ref 4.20–5.80)
RDW: 12.2 % (ref 11.0–15.0)
Total Lymphocyte: 25.1 %
WBC: 9.6 10*3/uL (ref 3.8–10.8)

## 2024-01-12 LAB — LIPID PANEL
Cholesterol: 128 mg/dL (ref <200)
HDL: 45 mg/dL (ref >=40)
LDL Cholesterol (Calc): 60 mg/dL
Non-HDL Cholesterol (Calc): 83 mg/dL (ref <130)
Total CHOL/HDL Ratio: 2.8 (calc) (ref <5.0)
Triglycerides: 144 mg/dL (ref <150)

## 2024-01-26 ENCOUNTER — Other Ambulatory Visit: Payer: Self-pay

## 2024-02-26 ENCOUNTER — Other Ambulatory Visit: Payer: Self-pay

## 2024-02-27 ENCOUNTER — Other Ambulatory Visit: Payer: Self-pay

## 2024-03-29 ENCOUNTER — Other Ambulatory Visit: Payer: Self-pay

## 2024-04-02 ENCOUNTER — Other Ambulatory Visit: Payer: Self-pay | Admitting: Family Medicine

## 2024-04-02 ENCOUNTER — Other Ambulatory Visit: Payer: Self-pay

## 2024-04-02 DIAGNOSIS — E785 Hyperlipidemia, unspecified: Secondary | ICD-10-CM

## 2024-04-05 ENCOUNTER — Other Ambulatory Visit: Payer: Self-pay

## 2024-04-05 ENCOUNTER — Other Ambulatory Visit: Payer: Self-pay | Admitting: Family Medicine

## 2024-04-05 ENCOUNTER — Encounter: Payer: Self-pay | Admitting: Family Medicine

## 2024-04-05 DIAGNOSIS — E785 Hyperlipidemia, unspecified: Secondary | ICD-10-CM

## 2024-04-06 ENCOUNTER — Other Ambulatory Visit: Payer: Self-pay | Admitting: Family Medicine

## 2024-04-06 ENCOUNTER — Other Ambulatory Visit: Payer: Self-pay

## 2024-04-06 DIAGNOSIS — E785 Hyperlipidemia, unspecified: Secondary | ICD-10-CM

## 2024-04-06 MED ORDER — TIRZEPATIDE 7.5 MG/0.5ML ~~LOC~~ SOAJ
7.5000 mg | SUBCUTANEOUS | 0 refills | Status: DC
  Filled 2024-04-06: qty 2, 28d supply, fill #0

## 2024-04-09 ENCOUNTER — Ambulatory Visit: Payer: 59 | Admitting: Family Medicine

## 2024-04-09 ENCOUNTER — Other Ambulatory Visit: Payer: Self-pay

## 2024-04-09 ENCOUNTER — Encounter: Payer: Self-pay | Admitting: Family Medicine

## 2024-04-09 VITALS — BP 134/82 | HR 99 | Resp 16 | Ht 69.0 in | Wt 232.6 lb

## 2024-04-09 DIAGNOSIS — J3089 Other allergic rhinitis: Secondary | ICD-10-CM | POA: Diagnosis not present

## 2024-04-09 DIAGNOSIS — E785 Hyperlipidemia, unspecified: Secondary | ICD-10-CM | POA: Diagnosis not present

## 2024-04-09 DIAGNOSIS — K219 Gastro-esophageal reflux disease without esophagitis: Secondary | ICD-10-CM | POA: Diagnosis not present

## 2024-04-09 DIAGNOSIS — I152 Hypertension secondary to endocrine disorders: Secondary | ICD-10-CM

## 2024-04-09 DIAGNOSIS — R350 Frequency of micturition: Secondary | ICD-10-CM

## 2024-04-09 DIAGNOSIS — E669 Obesity, unspecified: Secondary | ICD-10-CM

## 2024-04-09 DIAGNOSIS — E1169 Type 2 diabetes mellitus with other specified complication: Secondary | ICD-10-CM | POA: Diagnosis not present

## 2024-04-09 DIAGNOSIS — E1159 Type 2 diabetes mellitus with other circulatory complications: Secondary | ICD-10-CM

## 2024-04-09 DIAGNOSIS — E66811 Obesity, class 1: Secondary | ICD-10-CM

## 2024-04-09 DIAGNOSIS — N529 Male erectile dysfunction, unspecified: Secondary | ICD-10-CM

## 2024-04-09 LAB — POCT GLYCOSYLATED HEMOGLOBIN (HGB A1C): Hemoglobin A1C: 6.8 % — AB (ref 4.0–5.6)

## 2024-04-09 MED ORDER — ATORVASTATIN CALCIUM 40 MG PO TABS
40.0000 mg | ORAL_TABLET | Freq: Every day | ORAL | 1 refills | Status: DC
  Filled 2024-04-09: qty 90, 90d supply, fill #0
  Filled 2024-04-23: qty 30, 30d supply, fill #0
  Filled 2024-05-22: qty 30, 30d supply, fill #1
  Filled 2024-06-20: qty 30, 30d supply, fill #2
  Filled 2024-07-24: qty 30, 30d supply, fill #3
  Filled 2024-08-18: qty 30, 30d supply, fill #4
  Filled 2024-09-17: qty 30, 30d supply, fill #5

## 2024-04-09 MED ORDER — LISINOPRIL 10 MG PO TABS
10.0000 mg | ORAL_TABLET | Freq: Every day | ORAL | 1 refills | Status: DC
  Filled 2024-04-09: qty 90, 90d supply, fill #0
  Filled 2024-04-23: qty 30, 30d supply, fill #0
  Filled 2024-05-22: qty 30, 30d supply, fill #1
  Filled 2024-06-20: qty 30, 30d supply, fill #2

## 2024-04-09 MED ORDER — PIOGLITAZONE HCL 15 MG PO TABS
15.0000 mg | ORAL_TABLET | Freq: Every day | ORAL | 1 refills | Status: DC
  Filled 2024-04-09 – 2024-04-23 (×2): qty 30, 30d supply, fill #0
  Filled 2024-05-22: qty 30, 30d supply, fill #1
  Filled 2024-06-20: qty 30, 30d supply, fill #2

## 2024-04-09 MED ORDER — FREESTYLE LIBRE 3 PLUS SENSOR MISC
1 refills | Status: DC
Start: 2024-04-09 — End: 2024-09-20
  Filled 2024-04-09: qty 6, 90d supply, fill #0
  Filled 2024-06-20: qty 6, 90d supply, fill #1

## 2024-04-09 MED ORDER — TIRZEPATIDE 10 MG/0.5ML ~~LOC~~ SOAJ
10.0000 mg | SUBCUTANEOUS | 0 refills | Status: DC
  Filled 2024-04-09: qty 2, 28d supply, fill #0
  Filled 2024-04-12: qty 6, 84d supply, fill #0
  Filled 2024-04-23 – 2024-04-30 (×2): qty 2, 28d supply, fill #0
  Filled 2024-05-28: qty 2, 28d supply, fill #1
  Filled 2024-06-25: qty 2, 28d supply, fill #2

## 2024-04-09 MED ORDER — FLUTICASONE PROPIONATE 50 MCG/ACT NA SUSP
2.0000 | Freq: Every day | NASAL | 1 refills | Status: DC
  Filled 2024-04-09: qty 48, 90d supply, fill #0
  Filled 2024-04-23: qty 16, 30d supply, fill #0
  Filled 2024-05-28: qty 16, 30d supply, fill #1
  Filled 2024-06-25: qty 16, 30d supply, fill #2
  Filled 2024-09-10: qty 16, 30d supply, fill #3

## 2024-04-09 MED ORDER — TADALAFIL 5 MG PO TABS: 5.0000 mg | ORAL_TABLET | Freq: Every day | ORAL | 1 refills | Status: DC

## 2024-04-09 NOTE — Progress Notes (Signed)
 Name: Wayne Flowers   MRN: [id number]    DOB: Oct 15, 1976   Date:04/09/2024       Progress Note  Subjective  Chief Complaint  Chief Complaint  Patient presents with   Medical Management of Chronic Issues   Discussed the use of AI scribe software for clinical note transcription with the patient, who gave verbal consent to proceed.  History of Present Illness Wayne Flowers "Wayne Flowers" is a 48 year old male with type 2 diabetes who presents for a follow-up visit.  He manages his type 2 diabetes with Mounjaro  7.5 mg, preferring it over Trulicity  due to less nausea. He has experienced a gradual weight loss of about three pounds and believes his A1c has decreased slightly, with his [name] 3 showing a reading of 6.5, but A1C is our machine was 6.8 % still down from 7.1 % . He experiences occasional low blood sugar alarms, with the lowest being around 50, however without symptoms and usually resolves without intervention . He manages these episodes by shifting positions in bed  rather than eating immediately. No frequent low blood sugars, significant hypoglycemia symptoms, or excessive thirst.  He reports urinary frequency, needing to urinate every hour and a half during the day, but does not wake up at night to urinate. He consumes decaf coffee and Coke Zero, which he recently discovered contains caffeine. He is trying to reduce caffeine intake to manage urinary symptoms better. No nighttime urination.  He takes lisinopril  10 mg daily for hypertension but does not monitor his blood pressure at home. He is managing obesity, with a BMI now below 35. He is trying to eat more fresh vegetables and fruits, reduce bread intake, and has been going to the gym occasionally.  For gastroesophageal reflux disease, he takes famotidine  twice daily. He experiences heartburn only with spicy foods. Occasional heartburn with spicy foods.  He has year-round allergies and uses Flonase  nasal spray every morning  unless he forgets. He takes atorvastatin  for cholesterol management, with his last lipid panel showing improved levels.   He also takes Cialis  5 mg daily for erectile dysfunction, which he reports is effective.  He recently noticed possible toenail fungus after a pedicure, with one toenail appearing brittle. He sometimes stubs his toes, which may contribute to the condition.    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   Low libido 08/22/2013    Past Surgical History:  Procedure Laterality Date   COLONOSCOPY WITH PROPOFOL  N/A 04/21/2020   Procedure: COLONOSCOPY WITH PROPOFOL ;  Surgeon: Marnee Sink, MD;  Location: [facility];  Service: Endoscopy;  Laterality: N/A;   CYSTOSCOPY W/ RETROGRADES Left 09/16/2015   Procedure: CYSTOSCOPY WITH RETROGRADE PYELOGRAM;  Surgeon: Rea Cambridge, MD;  Location: [facility];  Service: Urology;  Laterality: Left;   CYSTOSCOPY WITH URETEROSCOPY AND STENT PLACEMENT     EXTRACORPOREAL SHOCK WAVE LITHOTRIPSY Left 03/26/2021   Procedure: EXTRACORPOREAL SHOCK WAVE LITHOTRIPSY (ESWL);  Surgeon: Rea Cambridge, MD;  Location: [facility];  Service: Urology;  Laterality: Left;   POLYPECTOMY  04/21/2020   Procedure: POLYPECTOMY;  Surgeon: Marnee Sink, MD;  Location: [facility];  Service: Endoscopy;;   URETEROSCOPY WITH HOLMIUM LASER LITHOTRIPSY Left 09/16/2015   Procedure: URETEROSCOPY WITH HOLMIUM LASER LITHOTRIPSY;  Surgeon: Rea Cambridge, MD;  Location: [facility];  Service: Urology;  Laterality: Left;   VASECTOMY      Family History  Problem Relation Age of Onset   Diabetes Mother    Cancer Father        renal   Spina bifida Sister    Appendicitis Son    Cancer Maternal Grandfather        black lung   Cancer Paternal Grandmother        lung   Colon cancer Paternal Grandmother    Diabetes Sister     Social History   Tobacco Use   Smoking status: Never   Smokeless tobacco: Never  Substance Use Topics   Alcohol use: Yes    Alcohol/week: 0.0 - 1.0 standard drinks of alcohol    Comment: rarley     Current Outpatient Medications:    atorvastatin  (LIPITOR) 40 MG tablet, Take 1 tablet (40 mg total) by mouth at bedtime., Disp: 90 tablet, Rfl: 0   famotidine  (PEPCID ) 20 MG tablet, Take 1 tablet (20 mg total) by mouth 2 (two) times daily as needed., Disp: 180 tablet, Rfl: 1   fluticasone  (FLONASE ) 50 MCG/ACT nasal spray, Place 2 sprays into both nostrils daily., Disp: 48 g, Rfl: 0   ketoconazole  (NIZORAL ) 2 % shampoo, Apply 1 Application topically 3 (three) times a week. Wash scalp 3 times weekly for 1 month, then once a week for maintenance, let sit 5 minutes before rinsing, Disp: 120 mL, Rfl: 5   lisinopril  (ZESTRIL ) 10 MG tablet, Take 1 tablet (10 mg total) by mouth daily., Disp: 90 tablet, Rfl: 0   pioglitazone  (ACTOS ) 15 MG tablet, Take 1 tablet (15 mg total) by mouth daily., Disp: 90 tablet, Rfl: 0   tadalafil  (CIALIS ) 5 MG tablet, Take 1 tablet (5 mg total) by mouth daily., Disp: 90 tablet, Rfl: 0  Allergies  Allergen Reactions   Omeprazole  Other (See Comments)    Reaction:  Memory loss    Metformin  And Related Diarrhea    I personally reviewed active problem list, medication list, allergies, family history with the patient/caregiver today.   ROS  Ten systems reviewed and is negative except as mentioned in HPI    Objective Physical Exam  CONSTITUTIONAL: Patient appears well-developed and well-nourished. No  distress. HEENT: Head atraumatic, normocephalic, neck supple. CARDIOVASCULAR: Normal rate, regular rhythm and normal heart sounds. No murmur heard. No BLE edema. Brittle nails on first and third toes. PULMONARY: Effort normal and breath sounds normal. No respiratory distress. ABDOMINAL: There is no tenderness or distention. MUSCULOSKELETAL: Normal gait. Without gross motor or sensory deficit. PSYCHIATRIC: Patient has a normal mood and affect. Behavior is normal. Judgment and thought content normal.  Vitals:   04/09/24 1421  BP: 134/82  Pulse: 99  Resp: 16  SpO2: 97%  Weight: 232 lb 9.6 oz (105.5 kg)  Height: 5\' 9"  (1.753 m)    Body mass index is 34.35 kg/m.  Recent Results (from the past 2160 hours)  POCT glycosylated hemoglobin (Hb A1C)     Status: Abnormal   Collection Time: 01/11/24  1:09 PM  Result Value Ref Range   Hemoglobin A1C 7.1 (A) 4.0 - 5.6 %   HbA1c POC (<> result, manual  entry)     HbA1c, POC (prediabetic range)     HbA1c, POC (controlled diabetic range)    Lipid panel     Status: None   Collection Time: 01/11/24  1:33 PM  Result Value Ref Range   Cholesterol 128 <200 mg/dL   HDL 45 > OR = 40 mg/dL   Triglycerides 191 <478 mg/dL   LDL Cholesterol (Calc) 60 mg/dL (calc)    Comment: Reference range: <100 . Desirable range <100 mg/dL for primary prevention;   <70 mg/dL for patients with CHD or diabetic patients  with > or = 2 CHD risk factors. Aaron Aas LDL-C is now calculated using the Martin-Hopkins  calculation, which is a validated novel method providing  better accuracy than the Friedewald equation in the  estimation of LDL-C.  Melinda Sprawls et al. Erroll Heard. 2956;213(08): 2061-2068  (http://education.[employer].com/faq/FAQ164)    Total CHOL/HDL Ratio 2.8 <5.0 (calc)   Non-HDL Cholesterol (Calc) 83 <657 mg/dL (calc)    Comment: For patients with diabetes plus 1 major ASCVD risk  factor, treating to a non-HDL-C goal of <100 mg/dL  (LDL-C of <84 mg/dL) is  considered a therapeutic  option.   COMPLETE METABOLIC PANEL WITH GFR     Status: Abnormal   Collection Time: 01/11/24  1:33 PM  Result Value Ref Range   Glucose, Bld 121 (H) 65 - 99 mg/dL    Comment: .            Fasting reference interval . For someone without known diabetes, a glucose value between 100 and 125 mg/dL is consistent with prediabetes and should be confirmed with a follow-up test. .    BUN 16 7 - 25 mg/dL   Creat 6.96 2.95 - 2.84 mg/dL   eGFR 71 > OR = 60 XL/KGM/0.10U7   BUN/Creatinine Ratio SEE NOTE: 6 - 22 (calc)    Comment:    Not Reported: BUN and Creatinine are within    reference range. .    Sodium 142 135 - 146 mmol/L   Potassium 4.4 3.5 - 5.3 mmol/L   Chloride 105 98 - 110 mmol/L   CO2 29 20 - 32 mmol/L   Calcium  9.5 8.6 - 10.3 mg/dL   Total Protein 6.9 6.1 - 8.1 g/dL   Albumin 4.6 3.6 - 5.1 g/dL   Globulin 2.3 1.9 - 3.7 g/dL (calc)   AG Ratio 2.0 1.0 - 2.5 (calc)   Total Bilirubin 0.4 0.2 - 1.2 mg/dL   Alkaline phosphatase (APISO) 61 36 - 130 U/L   AST 19 10 - 40 U/L   ALT 34 9 - 46 U/L  CBC with Differential/Platelet     Status: None   Collection Time: 01/11/24  1:33 PM  Result Value Ref Range   WBC 9.6 3.8 - 10.8 Thousand/uL   RBC 4.76 4.20 - 5.80 Million/uL   Hemoglobin 14.4 13.2 - 17.1 g/dL   HCT 25.3 66.4 - 40.3 %   MCV 90.1 80.0 - 100.0 fL   MCH 30.3 27.0 - 33.0 pg   MCHC 33.6 32.0 - 36.0 g/dL    Comment: For adults, a slight decrease in the calculated MCHC value (in the range of 30 to 32 g/dL) is most likely not clinically significant; however, it should be interpreted with caution in correlation with other red cell parameters and the patient's clinical condition.    RDW 12.2 11.0 - 15.0 %   Platelets 313 140 - 400 Thousand/uL   MPV 9.8 7.5 - 12.5 fL  Neutro Abs 6,413 1,500 - 7,800 cells/uL   Absolute Lymphocytes 2,410 850 - 3,900 cells/uL   Absolute Monocytes 595 200 - 950 cells/uL   Eosinophils Absolute 106 15 - 500  cells/uL   Basophils Absolute 77 0 - 200 cells/uL   Neutrophils Relative % 66.8 %   Total Lymphocyte 25.1 %   Monocytes Relative 6.2 %   Eosinophils Relative 1.1 %   Basophils Relative 0.8 %  POCT glycosylated hemoglobin (Hb A1C)     Status: Abnormal   Collection Time: 04/09/24  2:27 PM  Result Value Ref Range   Hemoglobin A1C 6.8 (A) 4.0 - 5.6 %   HbA1c POC (<> result, manual entry)     HbA1c, POC (prediabetic range)     HbA1c, POC (controlled diabetic range)        PHQ2/9:    04/09/2024    2:17 PM 01/11/2024   12:57 PM 10/05/2023    8:18 AM 06/08/2023    3:28 PM 02/23/2023    1:49 PM  Depression screen PHQ 2/9  Decreased Interest 0 0 0 0 0  Down, Depressed, Hopeless 0 0 0 0 0  PHQ - 2 Score 0 0 0 0 0  Altered sleeping  0 0 0 0  Tired, decreased energy  0 0 0 0  Change in appetite  0 0 0 0  Feeling bad or failure about yourself   0 0 0 0  Trouble concentrating  0 0 0 0  Moving slowly or fidgety/restless  0 0 0 0  Suicidal thoughts  0 0 0 0  PHQ-9 Score  0 0 0 0  Difficult doing work/chores  Not difficult at all       phq 9 is negative  Fall Risk:    04/09/2024    2:17 PM 01/11/2024   12:57 PM 10/05/2023    8:18 AM 06/08/2023    3:28 PM 02/23/2023    1:49 PM  Fall Risk   Falls in the past year? 0 0 0 0 0  Number falls in past yr: 0 0     Injury with Fall? 0 0     Risk for fall due to : No Fall Risks No Fall Risks No Fall Risks No Fall Risks No Fall Risks  Follow up Falls prevention discussed;Education provided;Falls evaluation completed Falls prevention discussed;Education provided;Falls evaluation completed Falls prevention discussed Falls prevention discussed Falls prevention discussed      Assessment & Plan Type 2 diabetes mellitus.  A1c improved to 6.8%. Tolerating Mounjaro  well with weight loss. Discussed increasing Mounjaro  to achieve A1c closer to 6.5%. - Increase Mounjaro  to 10 mg. - Continue pioglitazone  15 mg daily. - Refill Freestyle Libre sensors and  switch to new version after current supply. - Encourage dietary modifications and physical activity for weight loss and glucose control.  Hypertension Blood pressure 134/82 mmHg, slightly elevated from previous 122/74 mmHg. No medication changes needed. - Continue lisinopril  10 mg daily. - Encourage home blood pressure monitoring.  Obesity BMI below 35 with gradual weight loss. Weight associated with diabetes, reflux, and hypertension. - Encourage dietary modifications, including more fresh vegetables and fruits, and reduced bread consumption. - Encourage regular physical activity, including gym visits and daily walks.  Gastroesophageal reflux disease (GERD) No significant heartburn or indigestion unless consuming spicy foods. Discussed reducing famotidine  as weight loss may improve symptoms. - Reduce famotidine  to once daily before largest meal, typically breakfast. - Monitor symptoms and adjust famotidine  as needed.  Erectile dysfunction  Managed with Cialis  5 mg daily, effective. - Continue Cialis  5 mg daily. - Refill prescription at [facility] using [facility].  Allergic rhinitis Year-round allergies managed with Flonase  nasal spray daily. - Continue Flonase , two sprays once daily. - Refill Flonase  for six months.

## 2024-04-12 ENCOUNTER — Other Ambulatory Visit: Payer: Self-pay

## 2024-04-24 ENCOUNTER — Other Ambulatory Visit: Payer: Self-pay

## 2024-04-30 ENCOUNTER — Other Ambulatory Visit: Payer: Self-pay

## 2024-05-09 ENCOUNTER — Other Ambulatory Visit: Payer: Self-pay

## 2024-05-09 ENCOUNTER — Ambulatory Visit
Admission: RE | Admit: 2024-05-09 | Discharge: 2024-05-09 | Disposition: A | Discharge status: Home / Self Care | Source: Ambulatory Visit | Attending: Urology | Admitting: Urology

## 2024-05-09 ENCOUNTER — Other Ambulatory Visit
Admission: RE | Admit: 2024-05-09 | Discharge: 2024-05-09 | Disposition: A | Discharge status: Home / Self Care | Source: Home / Self Care | Attending: Urology | Admitting: Urology

## 2024-05-09 ENCOUNTER — Ambulatory Visit: Admitting: Urology

## 2024-05-09 ENCOUNTER — Ambulatory Visit
Admission: RE | Admit: 2024-05-09 | Discharge: 2024-05-09 | Disposition: A | Discharge status: Home / Self Care | Attending: Urology | Admitting: Urology

## 2024-05-09 ENCOUNTER — Encounter: Payer: Self-pay | Admitting: Urology

## 2024-05-09 VITALS — BP 147/81 | HR 73 | Ht 69.0 in | Wt 228.0 lb

## 2024-05-09 DIAGNOSIS — N200 Calculus of kidney: Secondary | ICD-10-CM | POA: Diagnosis present

## 2024-05-09 LAB — URINALYSIS, COMPLETE (UACMP) WITH MICROSCOPIC
Bilirubin Urine: NEGATIVE
Glucose, UA: NEGATIVE mg/dL
Ketones, ur: NEGATIVE mg/dL
Leukocytes,Ua: NEGATIVE
Nitrite: NEGATIVE
Protein, ur: 100 mg/dL — AB
RBC / HPF: >50 RBC/hpf (ref 0–5)
Specific Gravity, Urine: 1.02 (ref 1.005–1.030)
WBC, UA: NONE SEEN WBC/hpf (ref 0–5)
pH: 5.5 (ref 5.0–8.0)

## 2024-05-09 MED ORDER — KETOROLAC TROMETHAMINE 10 MG PO TABS
10.0000 mg | ORAL_TABLET | Freq: Four times a day (QID) | ORAL | 0 refills | Status: DC | PRN
  Filled 2024-05-09: qty 20, 5d supply, fill #0

## 2024-05-09 MED ORDER — TAMSULOSIN HCL 0.4 MG PO CAPS
0.4000 mg | ORAL_CAPSULE | Freq: Every day | ORAL | 0 refills | Status: DC
  Filled 2024-05-09: qty 30, 30d supply, fill #0

## 2024-05-09 MED ORDER — ONDANSETRON HCL 4 MG PO TABS
4.0000 mg | ORAL_TABLET | Freq: Three times a day (TID) | ORAL | 0 refills | Status: DC | PRN
  Filled 2024-05-09: qty 12, 4d supply, fill #0

## 2024-05-09 NOTE — Progress Notes (Signed)
 05/09/24 10:31 AM   Wayne Flowers [birth year] [id number]  CC: Right flank pain, history of nephrolithiasis  HPI: 48 year old male who is married to our clinic manager who was added to clinic today for right-sided flank pain and possible stone event.  He was previously followed by Dr. Sullivan Endow, and has a history of both shockwave lithotripsy and ureteroscopy in the past.  He has also passed multiple stone spontaneously.  He woke up early this morning with severe right-sided flank pain, symptoms ultimately improved with some ibuprofen that he took around 5 AM.  He denies any urinary symptoms, fevers, or chills.  Pain currently well-controlled.  Denies any nausea or vomiting at this time.  Urinalysis today with greater than 50 RBC but otherwise benign.   PMH: Past Medical History:  Diagnosis Date   Allergy    Atelectasis    Chronic kidney disease    KIDNEY STONE   ED (erectile dysfunction)    ED (erectile dysfunction)    GERD (gastroesophageal reflux disease)    Hearing loss    right   History of renal calculi 02/19/2013   Hypertension    Kidney stone 02/19/2013   Metabolic syndrome    Morbid obesity (HCC) 09/08/2017   BMI >35 plus diabetes   Obesity, Class I, BMI 30.0-34.9 (see actual BMI)    Pleurisy    PONV (postoperative nausea and vomiting)    Sleep apnea    HAS NOT USED CPAP IN 6 MONTHS   Type 2 diabetes mellitus (HCC) 06/23/2016    Surgical History: Past Surgical History:  Procedure Laterality Date   COLONOSCOPY WITH PROPOFOL  N/A 04/21/2020   Procedure: COLONOSCOPY WITH PROPOFOL ;  Surgeon: Marnee Sink, MD;  Location: [facility];  Service: Endoscopy;  Laterality: N/A;   CYSTOSCOPY W/ RETROGRADES Left 09/16/2015   Procedure: CYSTOSCOPY WITH RETROGRADE PYELOGRAM;  Surgeon: Rea Cambridge, MD;  Location: [facility];  Service: Urology;  Laterality: Left;   CYSTOSCOPY WITH URETEROSCOPY AND STENT PLACEMENT     EXTRACORPOREAL SHOCK WAVE LITHOTRIPSY Left  03/26/2021   Procedure: EXTRACORPOREAL SHOCK WAVE LITHOTRIPSY (ESWL);  Surgeon: Rea Cambridge, MD;  Location: [facility];  Service: Urology;  Laterality: Left;   POLYPECTOMY  04/21/2020   Procedure: POLYPECTOMY;  Surgeon: Marnee Sink, MD;  Location: [facility];  Service: Endoscopy;;   URETEROSCOPY WITH HOLMIUM LASER LITHOTRIPSY Left 09/16/2015   Procedure: URETEROSCOPY WITH HOLMIUM LASER LITHOTRIPSY;  Surgeon: Rea Cambridge, MD;  Location: [facility];  Service: Urology;  Laterality: Left;   VASECTOMY       Family History: Family History  Problem Relation Age of Onset   Diabetes Mother    Cancer Father        renal   Spina bifida Sister    Appendicitis Son    Cancer Maternal Grandfather        black lung   Cancer Paternal Grandmother        lung   Colon cancer Paternal Grandmother    Diabetes Sister     Social History:  reports that he has never smoked. He has never used smokeless tobacco. He reports current alcohol use. He reports that he does not use drugs.  Physical Exam: BP (!) 147/81 (BP Location: Left Arm, Patient Position: Sitting, Cuff Size: Large)   Pulse 73   Ht 5' 9 (1.753 m)   Wt 228 lb (103.4 kg)   SpO2 96%   BMI 33.67 kg/m    Constitutional:  Alert and oriented, No  acute distress. Cardiovascular: No clubbing, cyanosis, or edema. Respiratory: Normal respiratory effort, no increased work of breathing. GI: Abdomen is soft, nontender, nondistended, no abdominal masses   Laboratory Data: Reviewed, see HPI  Pertinent Imaging: I have personally viewed and interpreted the KUB today suggesting a 3 mm right distal ureteral stone.  Assessment & Plan:   48 year old male with history of recurrent nephrolithiasis, who presents with 5 hours of severe right-sided flank pain.  Using shared decision making in clinic we opted for a KUB over CT, and KUB suggested a 3 to 4 mm right distal ureteral stone, as well as nonobstructive appearing small renal stones  bilaterally.  We discussed various treatment options for urolithiasis including observation with or without medical expulsive therapy, shockwave lithotripsy (SWL), ureteroscopy and laser lithotripsy with stent placement, and percutaneous nephrolithotomy.We discussed that management is based on stone size, location, density, patient co-morbidities, and patient preference. Stones <36mm in size have a >80% spontaneous passage rate. Data surrounding the use of tamsulosin  for medical expulsive therapy is controversial, but meta analyses suggests it is most efficacious for distal stones between 5-39mm in size. Possible side effects include dizziness/lightheadedness, and retrograde ejaculation.SWL has a lower stone free rate in a single procedure, but also a lower complication rate compared to ureteroscopy and avoids a stent and associated stent related symptoms. Possible complications include renal hematoma, steinstrasse, and need for additional treatment.Ureteroscopy with laser lithotripsy and stent placement has a higher stone free rate than SWL in a single procedure, however increased complication rate including possible infection, ureteral injury, bleeding, and stent related morbidity. Common stent related symptoms include dysuria, urgency/frequency, and flank pain.  -He opts for trial of medical expulsive therapy -Flomax , Toradol , Zofran  sent in, strain your -Return precautions discussed at length, we will touch base next week regarding his symptoms   Jay Meth, MD 05/09/2024  [facility] [address], [address] [place], [place] [address] [phone]

## 2024-05-09 NOTE — Patient Instructions (Signed)
Kidney Stones  Kidney stones are solid, rock-like deposits that form inside of the kidneys. The kidneys are a pair of organs that make urine. A kidney stone may form in a kidney and move into other parts of the urinary tract, including the tubes that connect the kidneys to the bladder (ureters), the bladder, and the tube that carries urine out of the body (urethra). As the stone moves through these areas, it can cause intense pain and block the flow of urine. Kidney stones are created when high levels of certain minerals are found in the urine. The stones are usually passed out of the body through urination, but in some cases, medical treatment may be needed to remove them. What are the causes? Kidney stones may be caused by: A condition in which certain glands produce too much parathyroid hormone (primary hyperparathyroidism), which causes too much calcium buildup in the blood. A buildup of uric acid crystals in the bladder (hyperuricosuria). Uric acid is a chemical that the body produces when you eat certain foods. It usually leaves the body in the urine. Narrowing (stricture) of one or both of the ureters. A kidney blockage that is present at birth (congenital obstruction). Past surgery on the kidney or the ureters. What increases the risk? The following factors may make you more likely to develop this condition: Having had a kidney stone in the past. Having a family history of kidney stones. Not drinking enough water. Eating a diet that is high in protein, salt (sodium), or sugar. Being overweight or obese. What are the signs or symptoms? Symptoms of a kidney stone may include: Pain in the side of the abdomen, right below the ribs (flank pain). Pain usually spreads (radiates) to the groin. Needing to urinate often or urgently. Painful urination. Blood in the urine (hematuria). Nausea. Vomiting. Fever and chills. How is this diagnosed? This condition may be diagnosed based on: Your  symptoms and medical history. A physical exam. Blood tests. Urine tests. These may be done before and after the stone passes out of your body through urination. Imaging tests, such as a CT scan, abdominal X-ray, or ultrasound. A procedure to examine the inside of the bladder (cystoscopy). How is this treated? Treatment for kidney stones depends on the size, location, and makeup of the stones. Kidney stones will often pass out of the body through urination. You may need to: Increase your fluid intake to help pass the stone. In some cases, you may be given fluids through an IV and may need to be monitored in the hospital. Take medicine for pain. Make changes in your diet to help prevent kidney stones from coming back. Sometimes, procedures are needed to remove a kidney stone. This may involve: A procedure to break up kidney stones using: A focused beam of light (laser therapy). Shock waves (extracorporeal shock wave lithotripsy). Surgery to remove kidney stones. This may be needed if you have severe pain or have stones that block your urinary tract. Follow these instructions at home: Medicines Take over-the-counter and prescription medicines only as told by your health care provider. Ask your health care provider if the medicine prescribed to you requires you to avoid driving or using heavy machinery. Eating and drinking Drink enough fluid to keep your urine pale yellow. You may be instructed to drink at least 8-10 glasses of water each day. This will help you pass the kidney stone. If directed, change your diet. This may include: Limiting how much sodium you eat. Eating more fruits   and vegetables. Limiting how much animal protein you eat. Animal proteins include red meat, poultry, fish, and eggs. Eating a normal amount of calcium (1,000-1,300 mg per day). Follow instructions from your health care provider about eating or drinking restrictions. General instructions Collect urine samples as  told by your health care provider. You may need to collect a urine sample: 24 hours after you pass the stone. 8-12 weeks after you pass the kidney stone, and every 6-12 months after that. Strain your urine every time you urinate, for as long as directed. Use the strainer that your health care provider recommends. Do not throw out the kidney stone after passing it. Keep the stone so it can be tested by your health care provider. Testing the makeup of your kidney stone may help prevent you from getting kidney stones in the future. Keep all follow-up visits. You may need follow-up X-rays or ultrasounds to make sure that your stone has passed. How is this prevented? To prevent another kidney stone: Drink enough fluid to keep your urine pale yellow. This is the best way to prevent kidney stones. Eat a healthy diet. Follow recommendations from your health care provider about foods to avoid. Recommendations vary depending on the type of kidney stone that you have. You may be instructed to eat a low-protein diet. Maintain a healthy weight. Where to find more information National Kidney Foundation (NKF): www.kidney.org [facility] (UCF): www.urologyhealth.org Contact a health care provider if: You have pain that gets worse or does not get better with medicine. Get help right away if: You have a fever or chills. You develop severe pain. You develop new abdominal pain. You faint. You are unable to urinate. Summary Kidney stones are solid, rock-like deposits that form inside of the kidneys. Kidney stones can cause nausea, vomiting, blood in the urine, abdominal pain, and the urge to urinate often. Treatment for kidney stones depends on the size, location, and makeup of the stones. Kidney stones will often pass out of the body through urination. Kidney stones can be prevented by drinking enough fluids, eating a healthy diet, and maintaining a healthy weight. This information is not intended  to replace advice given to you by your health care provider. Make sure you discuss any questions you have with your health care provider. Document Revised: 02/24/2022 Document Reviewed: 02/24/2022 Elsevier Patient Education  2024 [employer] Inc.  

## 2024-05-11 ENCOUNTER — Other Ambulatory Visit: Payer: Self-pay

## 2024-05-11 ENCOUNTER — Other Ambulatory Visit
Admission: RE | Admit: 2024-05-11 | Discharge: 2024-05-11 | Disposition: A | Discharge status: Home / Self Care | Attending: Urology | Admitting: Urology

## 2024-05-11 DIAGNOSIS — N200 Calculus of kidney: Secondary | ICD-10-CM | POA: Diagnosis present

## 2024-05-19 LAB — STONE ANALYSIS
Calcium Oxalate Dihydrate: 20 %
Calcium Oxalate Monohydrate: 80 %
Weight Calculi: 38 mg

## 2024-05-22 ENCOUNTER — Ambulatory Visit: Payer: Self-pay | Admitting: Physician Assistant

## 2024-05-31 ENCOUNTER — Other Ambulatory Visit: Payer: Self-pay

## 2024-06-05 ENCOUNTER — Encounter: Payer: Self-pay | Admitting: Family Medicine

## 2024-06-05 ENCOUNTER — Ambulatory Visit: Admitting: Family Medicine

## 2024-06-05 ENCOUNTER — Other Ambulatory Visit: Payer: Self-pay

## 2024-06-05 ENCOUNTER — Ambulatory Visit: Admitting: Urology

## 2024-06-05 VITALS — BP 120/76 | Ht 69.0 in | Wt 222.0 lb

## 2024-06-05 VITALS — BP 118/74 | HR 80 | Resp 16 | Ht 69.0 in | Wt 222.0 lb

## 2024-06-05 DIAGNOSIS — N453 Epididymo-orchitis: Secondary | ICD-10-CM

## 2024-06-05 DIAGNOSIS — N50812 Left testicular pain: Secondary | ICD-10-CM

## 2024-06-05 MED ORDER — SULFAMETHOXAZOLE-TRIMETHOPRIM 800-160 MG PO TABS
1.0000 | ORAL_TABLET | Freq: Two times a day (BID) | ORAL | 0 refills | Status: DC
  Filled 2024-06-05: qty 20, 10d supply, fill #0

## 2024-06-05 MED ORDER — CELECOXIB 200 MG PO CAPS
200.0000 mg | ORAL_CAPSULE | Freq: Two times a day (BID) | ORAL | 0 refills | Status: DC
  Filled 2024-06-05: qty 10, 5d supply, fill #0

## 2024-06-05 NOTE — Patient Instructions (Signed)
Epididymitis  Epididymitis is inflammation or swelling of the epididymis. This is caused by an infection. The epididymis is a cord-like structure that is located along the top and back part of the testicle. It collects and stores sperm from the testicle. This condition can also cause pain and swelling of the testicle and scrotum. Symptoms usually start suddenly (acute epididymitis). Sometimes epididymitis starts gradually and lasts for a while (chronic epididymitis). Chronic epididymitis may be harder to treat. What are the causes? In men ages 1-40, this condition is usually caused by a bacterial infection or a sexually transmitted infection (STI), such as gonorrhea or chlamydia. In men 33 and older, this condition is usually caused by bacteria from a urinary blockage or from abnormalities in the urinary system. These can result from: Having a tube placed into the bladder (urinary catheter). Having an enlarged or inflamed prostate gland. Having recently had urinary tract surgery. Having a problem with a backward flow of urine (retrograde). In men who have a condition that weakens the body's defense system (immune system), such as human immunodeficiency virus (HIV), this condition can be caused by: Other bacteria, including tuberculosis and syphilis. Viruses. Fungi. Sometimes this condition occurs without infection. This may happen because of trauma or repetitive activities such as sports. What increases the risk? You are more likely to develop this condition if you have: Unprotected sex with more than one partner. Anal sex. Had recent surgery. A urinary catheter. Urinary problems. A suppressed immune system. What are the signs or symptoms? This condition usually begins suddenly with chills, fever, and pain behind the scrotum and in the testicle. Other symptoms include: Swelling of the scrotum, testicle, or both. Pain when ejaculating or urinating. Pain in the back or  abdomen. Nausea. Itching and discharge from the penis. A frequent need to pass urine. Redness, increased warmth, and tenderness of the scrotum. How is this diagnosed? Your health care provider can diagnose this condition based on your symptoms and medical history. Your health care provider will also do a physical exam to check your scrotum and testicle for swelling, pain, and redness. You may also have other tests, including: Testing of discharge from the penis. Testing your urine for infections, such as STIs. Ultrasound to check for blood flow and inflammation. Your health care provider may test you for other STIs, including HIV. How is this treated? Treatment for this condition depends on the cause. If your condition is caused by a bacterial infection, oral antibiotic medicine may be prescribed. If the bacterial infection has spread to your blood, you may need to receive IV antibiotics. For both bacterial and nonbacterial epididymitis, you may be treated with: Rest. Elevation of the scrotum. Pain medicines. Anti-inflammatory medicines. Surgery may be needed if: You have pus buildup in the scrotum (abscess). You have epididymitis that has not responded to other treatments. Follow these instructions at home: Medicines Take over-the-counter and prescription medicines only as told by your health care provider. If you were prescribed an antibiotic medicine, take it as told by your health care provider. Do not stop taking the antibiotic even if your condition improves. Sexual activity If your epididymitis was caused by an STI, avoid sexual activity until your treatment is complete. Inform your sexual partner or partners if you test positive for an STI. They may need to be treated. Do not engage in sexual activity with your partner or partners until their treatment is completed. Managing pain and swelling  If directed, raise (elevate) your scrotum and apply ice.  To do this: Put ice in a  plastic bag. Place a small towel or pillow between your legs. Rest your scrotum on the pillow or towel. Place another towel between your skin and the plastic bag. Leave the ice on for 20 minutes, 2-3 times a day. Remove the ice if your skin turns bright red. This is very important. If you cannot feel pain, heat, or cold, you have a greater risk of damage to the area. Keep your scrotum elevated and supported while resting. Ask your health care provider if you should wear a scrotal support, such as a jockstrap. Wear it as told by your health care provider. Try taking a sitz bath to help with discomfort. This is a warm water bath that is taken while you are sitting down. The water should come up to your hips and should cover your buttocks. Do this 3-4 times per day or as told by your health care provider. General instructions Drink enough fluid to keep your urine pale yellow. Return to your normal activities as told by your health care provider. Ask your health care provider what activities are safe for you. Keep all follow-up visits. This is important. Contact a health care provider if: You have a fever. Your pain medicine is not helping. Your pain is getting worse. Your symptoms do not improve within 3 days. Summary Epididymitis is inflammation or swelling of the epididymis. This is caused by an infection. This condition can also cause pain and swelling of the testicle and scrotum. Treatment for this condition depends on the cause. If your condition is caused by a bacterial infection, oral antibiotic medicine may be prescribed. Inform your sexual partner or partners if you test positive for an STI. They may need to be treated. Do not engage in sexual activity with your partner or partners until their treatment is completed. Contact a health care provider if your symptoms do not improve within 3 days. This information is not intended to replace advice given to you by your health care provider.  Make sure you discuss any questions you have with your health care provider. Document Revised: 06/24/2021 Document Reviewed: 06/24/2021 Elsevier Patient Education  2024 [employer].

## 2024-06-05 NOTE — Progress Notes (Signed)
   06/05/2024 10:11 AM   Wayne Flowers [birth year] [id number]  Reason for visit: Left scrotal pain, history of nephrolithiasis  HPI: 48 year old male who I saw a recently in June 2025 for right-sided flank pain, he spontaneously passed a small stone at that point.  He was sent over today from PCP for left-sided scrotal pain that has been present about 18 to 24 hours.  He thinks he bumped his left side scrotum against something and has had some discomfort since that time.  He denies any urinary symptoms.  No flank pain.  Denies any fevers or chills.  On exam, mild tenderness and swelling of the left testicle, no definite masses noted, right testicle benign.  48 year old male with left testicular pain, no urinary symptoms, likely epididymitis based on clinical picture.  We considered ordering an ultrasound but he deferred at this time and will consider reevaluation in the next few days if no improvement.    Bactrim  DS twice daily x 10 days for suspected epididymitis, Celebrex  sent He will contact us  over the next few days if no improvement, could consider ultrasound at that time.  No indication of torsion or mass on exam today  Wayne JAYSON Burnet, MD  [facility] [address], [address] [place], [place] [address] [phone]
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
## 2024-06-20 ENCOUNTER — Other Ambulatory Visit: Payer: Self-pay

## 2024-07-02 ENCOUNTER — Other Ambulatory Visit: Payer: Self-pay

## 2024-07-06 ENCOUNTER — Other Ambulatory Visit: Payer: Self-pay | Admitting: Medical Genetics

## 2024-07-09 ENCOUNTER — Other Ambulatory Visit
Admission: RE | Admit: 2024-07-09 | Discharge: 2024-07-09 | Disposition: A | Discharge status: Home / Self Care | Payer: Self-pay | Source: Ambulatory Visit | Attending: Medical Genetics | Admitting: Medical Genetics

## 2024-07-13 ENCOUNTER — Other Ambulatory Visit: Payer: Self-pay

## 2024-07-13 ENCOUNTER — Encounter: Payer: Self-pay | Admitting: Family Medicine

## 2024-07-13 ENCOUNTER — Ambulatory Visit: Admitting: Family Medicine

## 2024-07-13 VITALS — BP 110/66 | HR 95 | Resp 16 | Ht 69.0 in | Wt 212.9 lb

## 2024-07-13 DIAGNOSIS — E1169 Type 2 diabetes mellitus with other specified complication: Secondary | ICD-10-CM

## 2024-07-13 DIAGNOSIS — I152 Hypertension secondary to endocrine disorders: Secondary | ICD-10-CM

## 2024-07-13 DIAGNOSIS — K219 Gastro-esophageal reflux disease without esophagitis: Secondary | ICD-10-CM | POA: Diagnosis not present

## 2024-07-13 DIAGNOSIS — G4733 Obstructive sleep apnea (adult) (pediatric): Secondary | ICD-10-CM

## 2024-07-13 DIAGNOSIS — J3089 Other allergic rhinitis: Secondary | ICD-10-CM | POA: Diagnosis not present

## 2024-07-13 DIAGNOSIS — E785 Hyperlipidemia, unspecified: Secondary | ICD-10-CM

## 2024-07-13 DIAGNOSIS — N529 Male erectile dysfunction, unspecified: Secondary | ICD-10-CM | POA: Diagnosis not present

## 2024-07-13 DIAGNOSIS — E1159 Type 2 diabetes mellitus with other circulatory complications: Secondary | ICD-10-CM

## 2024-07-13 DIAGNOSIS — E669 Obesity, unspecified: Secondary | ICD-10-CM

## 2024-07-13 LAB — POCT GLYCOSYLATED HEMOGLOBIN (HGB A1C): Hemoglobin A1C: 5.9 % — AB (ref 4.0–5.6)

## 2024-07-13 MED ORDER — LISINOPRIL 5 MG PO TABS
5.0000 mg | ORAL_TABLET | Freq: Every day | ORAL | 0 refills | Status: DC
  Filled 2024-07-13: qty 30, 30d supply, fill #0
  Filled 2024-08-12: qty 30, 30d supply, fill #1
  Filled 2024-09-10: qty 30, 30d supply, fill #2

## 2024-07-13 MED ORDER — FAMOTIDINE 20 MG PO TABS
20.0000 mg | ORAL_TABLET | Freq: Two times a day (BID) | ORAL | 1 refills | Status: DC | PRN
  Filled 2024-07-13 – 2024-07-31 (×2): qty 180, 90d supply, fill #0

## 2024-07-13 NOTE — Progress Notes (Signed)
 Name: Wayne Flowers   MRN: [id number]    DOB: January 22, 1976   Date:07/13/2024       Progress Note  Subjective  Chief Complaint  Chief Complaint  Patient presents with   Medical Management of Chronic Issues   Discussed the use of AI scribe software for clinical note transcription with the patient, who gave verbal consent to proceed.  History of Present Illness Wayne Flowers is a 48 year old male with type 2 diabetes, dyslipidemia, obesity, and hypertension who presents for a follow-up visit.  He has experienced significant improvement in diabetes management, with his A1c decreasing from 6.8% in May to 5.9% currently. He is currently taking Mounjaro  for diabetes management, switched from Trulicity  during his visit three months ago   He has lost 20 pounds over the past three months, going from 232 pounds in May to 212 pounds currently since we switched his GLP-1 agonist to Mounjaro  . His clothes no longer fit, and he is using a belt. He has been more active and has improved his diet, avoiding bread and opting for healthier options like eggs, bacon, and avocados.  He mentions a toenail issue that was previously thought to be foot fungus but is now concerning as it appears to be affecting the nail bed. He has an upcoming appointment with a dermatologist to evaluate this.  He has a history of epididymitis for which he was treated with Bactrim , and the condition has resolved with no recurrence of pain.  He is currently taking lisinopril  10 mg for hypertension and has experienced lightheadedness upon standing. His blood pressure was recorded at 110/66.  He also has dyslipidemia and is taking atorvastatin . His last cholesterol check in February showed good levels with LDL at 60 and HDL at 45.  He has a history of mild sleep apnea diagnosed years ago, for which he tried a CPAP machine but could not tolerate it. His wife has not complained about his snoring recently, and he  attributes improvement to his weight loss.  He is taking Cialis  for erectile dysfunction and reports it is effective. He also takes Pepcid  twice a day for reflux, which he finds necessary to control his symptoms.    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   Low libido 08/22/2013    Past Surgical History:  Procedure Laterality Date   COLONOSCOPY WITH PROPOFOL  N/A 04/21/2020   Procedure: COLONOSCOPY WITH PROPOFOL ;  Surgeon: Jinny Carmine, MD;  Location: [facility];  Service: Endoscopy;  Laterality: N/A;   CYSTOSCOPY W/ RETROGRADES Left 09/16/2015   Procedure: CYSTOSCOPY WITH RETROGRADE PYELOGRAM;  Surgeon: Ozell JONELLE Burkes, MD;  Location: [facility];  Service: Urology;  Laterality: Left;   CYSTOSCOPY WITH URETEROSCOPY AND STENT PLACEMENT     EXTRACORPOREAL SHOCK WAVE LITHOTRIPSY Left 03/26/2021   Procedure: EXTRACORPOREAL SHOCK WAVE LITHOTRIPSY (ESWL);  Surgeon: Burkes Ozell JONELLE, MD;  Location: [facility];  Service: Urology;  Laterality: Left;   POLYPECTOMY  04/21/2020   Procedure: POLYPECTOMY;  Surgeon: Jinny Carmine,  MD;  Location: [facility];  Service: Endoscopy;;   URETEROSCOPY WITH HOLMIUM LASER LITHOTRIPSY Left 09/16/2015   Procedure: URETEROSCOPY WITH HOLMIUM LASER LITHOTRIPSY;  Surgeon: Ozell JONELLE Burkes, MD;  Location: [facility];  Service: Urology;  Laterality: Left;   VASECTOMY      Family History  Problem Relation Age of Onset   Diabetes Mother    Cancer Father        renal    Spina bifida Sister    Appendicitis Son    Cancer Maternal Grandfather        black lung   Cancer Paternal Grandmother        lung   Colon cancer Paternal Grandmother    Diabetes Sister     Social History   Tobacco Use   Smoking status: Never   Smokeless tobacco: Never  Substance Use Topics   Alcohol use: Yes    Alcohol/week: 0.0 - 1.0 standard drinks of alcohol    Comment: rarley     Current Outpatient Medications:    atorvastatin  (LIPITOR) 40 MG tablet, Take 1 tablet (40 mg total) by mouth at bedtime., Disp: 90 tablet, Rfl: 1   celecoxib  (CELEBREX ) 200 MG capsule, Take 1 capsule (200 mg total) by mouth 2 (two) times daily., Disp: 10 capsule, Rfl: 0   Continuous Glucose Sensor (FREESTYLE LIBRE 3 PLUS SENSOR) MISC, Change sensor every 15 days., Disp: 6 each, Rfl: 1   famotidine  (PEPCID ) 20 MG tablet, Take 1 tablet (20 mg total) by mouth 2 (two) times daily as needed., Disp: 180 tablet, Rfl: 1   fluticasone  (FLONASE ) 50 MCG/ACT nasal spray, Place 2 sprays into both nostrils daily., Disp: 48 g, Rfl: 1   ketoconazole  (NIZORAL ) 2 % shampoo, Apply 1 Application topically 3 (three) times a week. Wash scalp 3 times weekly for 1 month, then once a week for maintenance, let sit 5 minutes before rinsing, Disp: 120 mL, Rfl: 5   lisinopril  (ZESTRIL ) 10 MG tablet, Take 1 tablet (10 mg total) by mouth daily., Disp: 90 tablet, Rfl: 1   tadalafil  (CIALIS ) 5 MG tablet, Take 1 tablet (5 mg total) by mouth daily., Disp: 90 tablet, Rfl: 1   tirzepatide  (MOUNJARO ) 10 MG/0.5ML Pen, Inject 10 mg into the skin once a week., Disp: 6 mL, Rfl: 0  Allergies  Allergen Reactions   Omeprazole  Other (See Comments)    Reaction:  Memory loss    Metformin  And Related Diarrhea    I personally reviewed active problem list, medication list, allergies, family history with the patient/caregiver today.   ROS  Ten systems reviewed and is negative except as mentioned in HPI    Objective Physical  Exam  CONSTITUTIONAL: Patient appears well-developed and well-nourished. No distress. HEENT: Head atraumatic, normocephalic, neck supple. CARDIOVASCULAR: Normal rate, regular rhythm and normal heart sounds. No murmur heard. No BLE edema. PULMONARY: Effort normal and breath sounds normal. No respiratory distress. ABDOMINAL: There is no tenderness or distention. MUSCULOSKELETAL: Normal gait. Without gross motor or sensory deficit. PSYCHIATRIC: Patient has a normal mood and affect. Behavior is normal. Judgment and thought content normal. NEUROLOGICAL: Sensation intact bilaterally.  Vitals:   07/13/24 1440  BP: 110/66  Pulse: 95  Resp: 16  SpO2: 98%  Weight: 212 lb 14.4 oz (96.6 kg)  Height: 5' 9 (1.753 m)    Body mass index is 31.44 kg/m.  Recent Results (from the past 2160 hours)  Urinalysis, Complete w Microscopic -     Status: Abnormal   Collection  Time: 05/09/24  9:20 AM  Result Value Ref Range   Color, Urine YELLOW YELLOW   APPearance CLOUDY (A) CLEAR   Specific Gravity, Urine 1.020 1.005 - 1.030   pH 5.5 5.0 - 8.0   Glucose, UA NEGATIVE NEGATIVE mg/dL   Hgb urine dipstick LARGE (A) NEGATIVE   Bilirubin Urine NEGATIVE NEGATIVE   Ketones, ur NEGATIVE NEGATIVE mg/dL   Protein, ur 899 (A) NEGATIVE mg/dL   Nitrite NEGATIVE NEGATIVE   Leukocytes,Ua NEGATIVE NEGATIVE   Squamous Epithelial / HPF 0-5 0 - 5 /HPF   WBC, UA NONE SEEN 0 - 5 WBC/hpf   RBC / HPF >50 0 - 5 RBC/hpf   Bacteria, UA FEW (A) NONE SEEN   Mucus PRESENT    Hyaline Casts, UA PRESENT     Comment: Performed at [facility], [address] [place], [place] [address]  Calculi, with Photograph     Status: None   Collection Time: 05/11/24  2:18 PM  Result Value Ref Range   Source Comment     Comment: Not provided   Color Calculi Brown    Size Calculi 6x3 mm    Comment: Single piece received.   Weight Calculi 38 mg   Calcium  Oxalate Monohydrate 80 %   Calcium  Oxalate Dihydrate 20 %     Comment: (NOTE) Performed At: [facility] [address] [place], [place] [id number] Owen GAILS PhD Ey:[id number]   POCT glycosylated hemoglobin (Hb A1C)     Status: Abnormal   Collection Time: 07/13/24  2:48 PM  Result Value Ref Range   Hemoglobin A1C 5.9 (A) 4.0 - 5.6 %   HbA1c POC (<> result, manual entry)     HbA1c, POC (prediabetic range)     HbA1c, POC (controlled diabetic range)      Diabetic Foot Exam:  Diabetic foot exam was performed with the following findings:   Normal sensation of 10g monofilament Intact posterior tibialis and dorsalis pedis pulses 3 rd toe left foot, has dark area that seems to be rising above nail bed        PHQ2/9:    07/13/2024    2:36 PM 06/05/2024    8:03 AM 04/09/2024    2:17 PM 01/11/2024   12:57 PM 10/05/2023    8:18 AM  Depression screen PHQ 2/9  Decreased Interest 0 0 0 0 0  Down, Depressed, Hopeless 0 0 0 0 0  PHQ - 2 Score 0 0 0 0 0  Altered sleeping    0 0  Tired, decreased energy    0 0  Change in appetite    0 0  Feeling bad or failure about yourself     0 0  Trouble concentrating    0 0  Moving slowly or fidgety/restless    0 0  Suicidal thoughts    0 0  PHQ-9 Score    0 0  Difficult doing work/chores    Not difficult at all     phq 9 is negative  Fall Risk:    07/13/2024    2:36 PM 06/05/2024    8:03 AM 04/09/2024    2:17 PM 01/11/2024   12:57 PM 10/05/2023    8:18 AM  Fall Risk   Falls in the past year? 0 0 0 0 0  Number falls in past yr: 0 0 0 0   Injury with Fall? 0 0 0 0   Risk for fall due to : No Fall Risks  No Fall Risks No Fall Risks No Fall Risks No Fall Risks  Follow up Falls evaluation completed Falls evaluation completed Falls prevention discussed;Education provided;Falls evaluation completed Falls prevention discussed;Education provided;Falls evaluation completed Falls prevention discussed     Assessment and Plan Assessment & Plan Type 2 diabetes mellitus with obesity, hypertension, and  dyslipidemia Diabetes well-controlled with A1c at 5.9%. Weight loss of 20 pounds since May improved diabetes and blood pressure control. Hypertension well-managed but occasional lightheadedness noted. Dyslipidemia controlled with atorvastatin . Obesity addressed with weight loss and lifestyle changes. - Stop pioglitazone  due to improved A1c and potential for weight gain. - Reduce lisinopril  from 10 mg to 5 mg due to low blood pressure and weight loss. - Continue Mounjaro  for diabetes management. - Continue atorvastatin  for dyslipidemia management. - Monitor blood pressure and consider further reduction of lisinopril  if lightheadedness persists. - Order urine microalbumin test to assess kidney function. - Encourage continued weight loss and healthy lifestyle changes.  Gastroesophageal reflux disease (GERD) GERD managed with famotidine ; skipping doses results in heartburn. - Continue famotidine  20 mg twice daily.  Erectile dysfunction Erectile dysfunction effectively managed with daily Cialis  5 mg. - Continue Cialis  5 mg daily.  Suspected nail disorder, left third toe Changes in left third toe nail raise concern for possible melanoma. Dermatology evaluation scheduled. - I will send image to his Dermatologist to see if appointment needs to be sooner  - Ensure he discusses the nail changes with the dermatologist. - Monitor for increased inflammation or changes in the nail.

## 2024-07-14 LAB — MICROALBUMIN / CREATININE URINE RATIO
Creatinine, Urine: 212 mg/dL (ref 20–320)
Microalb Creat Ratio: 2 mg/g{creat} (ref <30)
Microalb, Ur: 0.5 mg/dL

## 2024-07-16 ENCOUNTER — Ambulatory Visit: Payer: Self-pay | Admitting: Family Medicine

## 2024-07-20 LAB — GENECONNECT MOLECULAR SCREEN: Genetic Analysis Overall Interpretation: NEGATIVE

## 2024-07-24 ENCOUNTER — Other Ambulatory Visit: Payer: Self-pay | Admitting: Family Medicine

## 2024-07-24 ENCOUNTER — Other Ambulatory Visit: Payer: Self-pay

## 2024-07-24 DIAGNOSIS — E1159 Type 2 diabetes mellitus with other circulatory complications: Secondary | ICD-10-CM

## 2024-07-24 MED ORDER — MOUNJARO 10 MG/0.5ML ~~LOC~~ SOAJ
10.0000 mg | SUBCUTANEOUS | 0 refills | Status: DC
  Filled 2024-07-24: qty 2, 28d supply, fill #0
  Filled 2024-08-18 – 2024-08-25 (×2): qty 2, 28d supply, fill #1
  Filled 2024-09-19: qty 2, 28d supply, fill #2

## 2024-07-31 ENCOUNTER — Other Ambulatory Visit: Payer: Self-pay

## 2024-08-01 ENCOUNTER — Other Ambulatory Visit: Payer: Self-pay

## 2024-08-01 ENCOUNTER — Ambulatory Visit: Payer: 59 | Admitting: Dermatology

## 2024-08-01 ENCOUNTER — Encounter: Payer: Self-pay | Admitting: Dermatology

## 2024-08-01 DIAGNOSIS — L820 Inflamed seborrheic keratosis: Secondary | ICD-10-CM | POA: Diagnosis not present

## 2024-08-01 DIAGNOSIS — Z79899 Other long term (current) drug therapy: Secondary | ICD-10-CM

## 2024-08-01 DIAGNOSIS — S90122A Contusion of left lesser toe(s) without damage to nail, initial encounter: Secondary | ICD-10-CM | POA: Diagnosis not present

## 2024-08-01 DIAGNOSIS — L219 Seborrheic dermatitis, unspecified: Secondary | ICD-10-CM

## 2024-08-01 DIAGNOSIS — B360 Pityriasis versicolor: Secondary | ICD-10-CM

## 2024-08-01 DIAGNOSIS — Z7189 Other specified counseling: Secondary | ICD-10-CM

## 2024-08-01 DIAGNOSIS — S90222A Contusion of left lesser toe(s) with damage to nail, initial encounter: Secondary | ICD-10-CM

## 2024-08-01 MED ORDER — KETOCONAZOLE 2 % EX SHAM
1.0000 | MEDICATED_SHAMPOO | CUTANEOUS | 11 refills | Status: AC
  Filled 2024-08-01: qty 120, 30d supply, fill #0

## 2024-08-01 NOTE — Patient Instructions (Addendum)

## 2024-08-01 NOTE — Progress Notes (Signed)
   Follow-Up Visit   Subjective  Wayne Flowers is a 48 y.o. male who presents for the following: skin tags neck, axilla, Tinea versicolor scalp, Ketoconazole  2% shampoo 1-2x/monthcheck L 3rd toenail, discoloration 3-4 months, not sure if changed, has had 2-3 episodes when that toe swelled up overnight then resolved  The following portions of the chart were reviewed this encounter and updated as appropriate: medications, allergies, medical history  Review of Systems:  No other skin or systemic complaints except as noted in HPI or Assessment and Plan.  Objective  Well appearing patient in no apparent distress; mood and affect are within normal limits.  A focused examination was performed of the following areas: Neck, axilla, scalp, toenails  Relevant exam findings are noted in the Assessment and Plan.  L 3rd toenial     neck x 7, L axilla x 6, R axilla x 1 (14) Stuck on waxy paps with erythema  Assessment & Plan   Seborrheic dermatitis with tinea Versicolor Scalp  Chronic and persistent condition with duration or expected duration over one year. Condition is symptomatic / bothersome to patient. Not to goal. Tinea versicolor is a chronic recurrent skin rash causing discolored scaly spots most commonly seen on back, chest, and/or shoulders.  It is generally asymptomatic. The rash is due to overgrowth of a common type of yeast present on everyone's skin and it is not contagious.  It tends to flare more in the summer due to increased sweating on trunk.  After rash is treated, the scaliness will resolve, but the discoloration will take longer to return to normal pigmentation. The periodic use of an OTC medicated soap/shampoo with zinc or selenium sulfide can be helpful to prevent yeast overgrowth and recurrence. Treatment:  Cont Ketoconazole  2% shampoo 1-2 times per month, let sit 5 minutes and rinse out  NAIL PROBLEM Subuungual hematoma 2ndary to trauma Exam:  purple subungual  discoloration;  no Hutchinson's sign Treatment Plan: Benign-appearing.  Observation.  Call clinic for new or changing moles.  Recommend daily use of broad spectrum spf 30+ sunscreen to sun-exposed areas.  Reassured OK today.    INFLAMED SEBORRHEIC KERATOSIS (14) neck x 7, L axilla x 6, R axilla x 1 (14) Symptomatic, irritating, patient would like treated. Destruction of lesion - neck x 7, L axilla x 6, R axilla x 1 (14) Complexity: simple   Destruction method: cryotherapy   Informed consent: discussed and consent obtained   Timeout:  patient name, date of birth, surgical site, and procedure verified Lesion destroyed using liquid nitrogen: Yes   Region frozen until ice ball extended beyond lesion: Yes   Outcome: patient tolerated procedure well with no complications   Post-procedure details: wound care instructions given     Return in about 1 year (around 08/01/2025) for Tinea Versicolor.  I, Grayce Saunas, RMA, am acting as scribe for Alm Rhyme, MD .   Documentation: I have reviewed the above documentation for accuracy and completeness, and I agree with the above.  Alm Rhyme, MD

## 2024-08-12 ENCOUNTER — Other Ambulatory Visit: Payer: Self-pay | Admitting: Family Medicine

## 2024-08-12 ENCOUNTER — Other Ambulatory Visit: Payer: Self-pay

## 2024-08-12 DIAGNOSIS — E785 Hyperlipidemia, unspecified: Secondary | ICD-10-CM

## 2024-08-12 DIAGNOSIS — E669 Obesity, unspecified: Secondary | ICD-10-CM

## 2024-08-13 ENCOUNTER — Other Ambulatory Visit: Payer: Self-pay

## 2024-08-20 ENCOUNTER — Encounter: Payer: Self-pay | Admitting: Pharmacist

## 2024-08-20 ENCOUNTER — Other Ambulatory Visit: Payer: Self-pay

## 2024-08-21 ENCOUNTER — Other Ambulatory Visit (HOSPITAL_COMMUNITY): Payer: Self-pay

## 2024-08-22 ENCOUNTER — Other Ambulatory Visit: Payer: Self-pay | Admitting: Family Medicine

## 2024-08-22 DIAGNOSIS — E785 Hyperlipidemia, unspecified: Secondary | ICD-10-CM

## 2024-08-22 DIAGNOSIS — E1159 Type 2 diabetes mellitus with other circulatory complications: Secondary | ICD-10-CM

## 2024-08-23 ENCOUNTER — Other Ambulatory Visit: Payer: Self-pay

## 2024-08-23 ENCOUNTER — Other Ambulatory Visit (HOSPITAL_COMMUNITY): Payer: Self-pay

## 2024-08-25 ENCOUNTER — Other Ambulatory Visit: Payer: Self-pay | Admitting: Family Medicine

## 2024-08-25 DIAGNOSIS — E1169 Type 2 diabetes mellitus with other specified complication: Secondary | ICD-10-CM

## 2024-08-25 DIAGNOSIS — E1159 Type 2 diabetes mellitus with other circulatory complications: Secondary | ICD-10-CM

## 2024-08-27 ENCOUNTER — Other Ambulatory Visit: Payer: Self-pay

## 2024-08-28 ENCOUNTER — Other Ambulatory Visit (HOSPITAL_COMMUNITY): Payer: Self-pay

## 2024-09-11 ENCOUNTER — Other Ambulatory Visit: Payer: Self-pay

## 2024-09-11 MED ORDER — FLUZONE 0.5 ML IM SUSY
0.5000 mL | PREFILLED_SYRINGE | Freq: Once | INTRAMUSCULAR | 0 refills | Status: AC
  Filled 2024-09-11: qty 0.5, 1d supply, fill #0

## 2024-09-17 ENCOUNTER — Other Ambulatory Visit: Payer: Self-pay | Admitting: Family Medicine

## 2024-09-17 ENCOUNTER — Other Ambulatory Visit: Payer: Self-pay

## 2024-09-17 DIAGNOSIS — E119 Type 2 diabetes mellitus without complications: Secondary | ICD-10-CM

## 2024-09-17 DIAGNOSIS — E1169 Type 2 diabetes mellitus with other specified complication: Secondary | ICD-10-CM

## 2024-09-18 ENCOUNTER — Encounter: Payer: Self-pay | Admitting: Family Medicine

## 2024-09-18 ENCOUNTER — Other Ambulatory Visit: Payer: Self-pay

## 2024-09-19 ENCOUNTER — Other Ambulatory Visit: Payer: Self-pay | Admitting: Family Medicine

## 2024-09-19 ENCOUNTER — Other Ambulatory Visit: Payer: Self-pay

## 2024-09-19 DIAGNOSIS — E119 Type 2 diabetes mellitus without complications: Secondary | ICD-10-CM

## 2024-09-19 DIAGNOSIS — E785 Hyperlipidemia, unspecified: Secondary | ICD-10-CM

## 2024-09-20 ENCOUNTER — Other Ambulatory Visit: Payer: Self-pay

## 2024-09-20 ENCOUNTER — Other Ambulatory Visit: Payer: Self-pay | Admitting: Family Medicine

## 2024-09-20 DIAGNOSIS — E119 Type 2 diabetes mellitus without complications: Secondary | ICD-10-CM

## 2024-09-20 DIAGNOSIS — E1169 Type 2 diabetes mellitus with other specified complication: Secondary | ICD-10-CM

## 2024-09-20 MED ORDER — FREESTYLE LIBRE 3 PLUS SENSOR MISC
1 refills | Status: AC
  Filled 2024-09-20: qty 6, 90d supply, fill #0
  Filled 2025-01-02: qty 6, 90d supply, fill #1

## 2024-10-08 ENCOUNTER — Other Ambulatory Visit: Payer: Self-pay | Admitting: Family Medicine

## 2024-10-08 ENCOUNTER — Other Ambulatory Visit: Payer: Self-pay

## 2024-10-08 DIAGNOSIS — E119 Type 2 diabetes mellitus without complications: Secondary | ICD-10-CM

## 2024-10-10 ENCOUNTER — Other Ambulatory Visit: Payer: Self-pay

## 2024-10-10 MED FILL — Lisinopril Tab 5 MG: ORAL | 30 days supply | Qty: 30 | Fill #0 | Status: AC

## 2024-10-10 NOTE — Telephone Encounter (Signed)
 Courtesy refill given, appointment needed.   Requested Prescriptions  Pending Prescriptions Disp Refills   lisinopril  (ZESTRIL ) 5 MG tablet 30 tablet 0    Sig: Take 1 tablet (5 mg total) by mouth daily. OFFICE VISIT NEEDED FOR ADDITIONAL REFILLS     Cardiovascular:  ACE Inhibitors Failed - 10/10/2024  4:33 PM      Failed - Cr in normal range and within 180 days    Creat  Date Value Ref Range Status  01/11/2024 1.24 0.60 - 1.29 mg/dL Final   Creatinine, Urine  Date Value Ref Range Status  07/13/2024 212 20 - 320 mg/dL Final         Failed - K in normal range and within 180 days    Potassium  Date Value Ref Range Status  01/11/2024 4.4 3.5 - 5.3 mmol/L Final         Passed - Patient is not pregnant      Passed - Last BP in normal range    BP Readings from Last 1 Encounters:  07/13/24 110/66         Passed - Valid encounter within last 6 months    Recent Outpatient Visits           2 months ago Dyslipidemia due to type 2 diabetes mellitus [facility])   [place] [facility] Glenard Mire, MD   4 months ago Left testicular pain   [place] [facility] Glenard Mire, MD   6 months ago Dyslipidemia due to type 2 diabetes mellitus [facility])   [place] [facility] Sowles, Krichna, MD   9 months ago Dyslipidemia due to type 2 diabetes mellitus [facility])   [facility] Sowles, Krichna, MD       Future Appointments             In 5 days Sowles, Krichna, MD [facility], [place]   In 10 months Hester Alm BROCKS, MD [facility]

## 2024-10-11 ENCOUNTER — Other Ambulatory Visit: Payer: Self-pay

## 2024-10-15 ENCOUNTER — Ambulatory Visit: Admitting: Family Medicine

## 2024-10-16 ENCOUNTER — Ambulatory Visit (INDEPENDENT_AMBULATORY_CARE_PROVIDER_SITE_OTHER): Admitting: Family Medicine

## 2024-10-16 ENCOUNTER — Encounter: Payer: Self-pay | Admitting: Family Medicine

## 2024-10-16 ENCOUNTER — Other Ambulatory Visit: Payer: Self-pay

## 2024-10-16 VITALS — BP 128/76 | HR 84 | Resp 16 | Ht 69.0 in | Wt 212.9 lb

## 2024-10-16 DIAGNOSIS — E119 Type 2 diabetes mellitus without complications: Secondary | ICD-10-CM

## 2024-10-16 DIAGNOSIS — G4733 Obstructive sleep apnea (adult) (pediatric): Secondary | ICD-10-CM | POA: Diagnosis not present

## 2024-10-16 DIAGNOSIS — E1169 Type 2 diabetes mellitus with other specified complication: Secondary | ICD-10-CM | POA: Diagnosis not present

## 2024-10-16 DIAGNOSIS — E66811 Obesity, class 1: Secondary | ICD-10-CM

## 2024-10-16 DIAGNOSIS — Z7985 Long-term (current) use of injectable non-insulin antidiabetic drugs: Secondary | ICD-10-CM

## 2024-10-16 DIAGNOSIS — E669 Obesity, unspecified: Secondary | ICD-10-CM

## 2024-10-16 DIAGNOSIS — N529 Male erectile dysfunction, unspecified: Secondary | ICD-10-CM

## 2024-10-16 DIAGNOSIS — E785 Hyperlipidemia, unspecified: Secondary | ICD-10-CM

## 2024-10-16 DIAGNOSIS — K219 Gastro-esophageal reflux disease without esophagitis: Secondary | ICD-10-CM

## 2024-10-16 DIAGNOSIS — I10 Essential (primary) hypertension: Secondary | ICD-10-CM

## 2024-10-16 DIAGNOSIS — J3089 Other allergic rhinitis: Secondary | ICD-10-CM

## 2024-10-16 LAB — POCT GLYCOSYLATED HEMOGLOBIN (HGB A1C): Hemoglobin A1C: 6 % — AB (ref 4.0–5.6)

## 2024-10-16 MED ORDER — MOUNJARO 10 MG/0.5ML ~~LOC~~ SOAJ
10.0000 mg | SUBCUTANEOUS | 0 refills | Status: AC
  Filled 2024-10-16: qty 2, 28d supply, fill #0
  Filled 2024-11-19: qty 2, 28d supply, fill #1
  Filled 2024-12-17: qty 2, 28d supply, fill #2

## 2024-10-16 MED ORDER — TADALAFIL 5 MG PO TABS
5.0000 mg | ORAL_TABLET | Freq: Every day | ORAL | 1 refills | Status: AC
Start: 2024-10-16 — End: ?

## 2024-10-16 MED ORDER — FAMOTIDINE 20 MG PO TABS
20.0000 mg | ORAL_TABLET | Freq: Two times a day (BID) | ORAL | 1 refills | Status: AC | PRN
Start: 2024-10-16 — End: 2025-10-16
  Filled 2024-10-16: qty 60, 30d supply, fill #0
  Filled 2024-11-30 – 2024-12-03 (×2): qty 60, 30d supply, fill #1
  Filled 2025-01-01: qty 60, 30d supply, fill #2

## 2024-10-16 MED ORDER — LISINOPRIL 5 MG PO TABS
5.0000 mg | ORAL_TABLET | Freq: Every day | ORAL | 0 refills | Status: AC
  Filled 2024-10-16: qty 90, 90d supply, fill #0
  Filled 2024-11-10: qty 30, 30d supply, fill #0
  Filled 2024-12-11: qty 30, 30d supply, fill #1

## 2024-10-16 MED ORDER — FLUTICASONE PROPIONATE 50 MCG/ACT NA SUSP
2.0000 | Freq: Every day | NASAL | 1 refills | Status: AC
  Filled 2024-10-16: qty 16, 30d supply, fill #0
  Filled 2024-11-19: qty 16, 30d supply, fill #1
  Filled 2024-12-17: qty 16, 30d supply, fill #2

## 2024-10-16 MED ORDER — ATORVASTATIN CALCIUM 40 MG PO TABS
40.0000 mg | ORAL_TABLET | Freq: Every day | ORAL | 1 refills | Status: AC
  Filled 2024-10-16: qty 30, 30d supply, fill #0
  Filled 2024-11-19: qty 30, 30d supply, fill #1
  Filled 2024-12-17: qty 30, 30d supply, fill #2

## 2024-10-16 NOTE — Progress Notes (Signed)
 Name: Draken Farrior   MRN: [id number]    DOB: Sep 11, 1976   Date:10/16/2024       Progress Note  Subjective  Chief Complaint  Chief Complaint  Patient presents with   Medical Management of Chronic Issues   Discussed the use of AI scribe software for clinical note transcription with the patient, who gave verbal consent to proceed.  History of Present Illness Wayne Flowers is a 48 year old male who presents for a follow-up visit.  He has type 2 diabetes managed with Mounjaro  10 mg and a Freestyle Libre continuous glucose monitor. His hemoglobin A1c has improved from 7.1% in February to 6.0% currently. He reports better eating habits and increased gym attendance. He has lost weight from 222 pounds in July to 212 pounds in August, with stabilization since. No increased hunger, thirst, or urination, and no medication side effects. 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  His hypertension is managed with lisinopril  5 mg daily, reduced from 10 mg three months ago, with stable blood pressure at 128/76 mmHg. He is on atorvastatin  40 mg for dyslipidemia with no muscle aches.  He has seborrheic dermatitis with tinea versicolor, treated with ketoconazole  shampoo for his scalp. No symptoms beyond the scalp and unsure of the shampoo's effectiveness.  He uses Flonase  daily for allergies and takes Pepcid  twice a day for reflux, which effectively controls his symptoms. He also takes Cialis  5 mg daily, obtained through [facility] at a cost of $35 for a 90-day supply.  He has not received the shingles vaccine yet and completed his flu shot on September 11, 2024.    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   Low libido 08/22/2013    Past Surgical History:  Procedure Laterality Date   COLONOSCOPY WITH PROPOFOL  N/A 04/21/2020   Procedure: COLONOSCOPY WITH PROPOFOL ;  Surgeon: Jinny Carmine, MD;  Location: [facility];  Service: Endoscopy;  Laterality: N/A;   CYSTOSCOPY W/ RETROGRADES Left 09/16/2015   Procedure: CYSTOSCOPY WITH RETROGRADE PYELOGRAM;  Surgeon: Ozell JONELLE Burkes, MD;  Location: [facility];  Service: Urology;  Laterality: Left;   CYSTOSCOPY WITH URETEROSCOPY AND STENT PLACEMENT     EXTRACORPOREAL SHOCK WAVE LITHOTRIPSY Left 03/26/2021   Procedure: EXTRACORPOREAL SHOCK WAVE LITHOTRIPSY (ESWL);  Surgeon: Burkes Ozell JONELLE, MD;  Location: [facility];  Service: Urology;  Laterality: Left;   POLYPECTOMY  04/21/2020   Procedure: POLYPECTOMY;  Surgeon: Jinny Carmine, MD;  Location: [facility];  Service: Endoscopy;;   URETEROSCOPY WITH HOLMIUM LASER LITHOTRIPSY Left 09/16/2015   Procedure: URETEROSCOPY WITH HOLMIUM LASER LITHOTRIPSY;  Surgeon: Ozell JONELLE Burkes, MD;  Location: [facility];  Service: Urology;  Laterality: Left;   VASECTOMY      Family History  Problem Relation Age of Onset   Diabetes Mother    Cancer Father        renal   Spina bifida  Sister    Appendicitis Son    Cancer Maternal Grandfather        black lung   Cancer Paternal Grandmother        lung   Colon cancer Paternal Grandmother    Diabetes Sister     Social History   Tobacco Use   Smoking status: Never   Smokeless tobacco: Never  Substance Use Topics   Alcohol use: Yes    Alcohol/week: 0.0 - 1.0 standard drinks of alcohol    Comment: rarley     Current Outpatient Medications:     atorvastatin  (LIPITOR) 40 MG tablet, Take 1 tablet (40 mg total) by mouth at bedtime., Disp: 90 tablet, Rfl: 1   celecoxib  (CELEBREX ) 200 MG capsule, Take 1 capsule (200 mg total) by mouth 2 (two) times daily., Disp: 10 capsule, Rfl: 0   Continuous Glucose Sensor (FREESTYLE LIBRE 3 PLUS SENSOR) MISC, Change sensor every 15 days., Disp: 6 each, Rfl: 1   famotidine  (PEPCID ) 20 MG tablet, Take 1 tablet (20 mg total) by mouth 2 (two) times daily as needed., Disp: 180 tablet, Rfl: 1   fluticasone  (FLONASE ) 50 MCG/ACT nasal spray, Place 2 sprays into both nostrils daily., Disp: 48 g, Rfl: 1   ketoconazole  (NIZORAL ) 2 % shampoo, Apply 1 Application topically 3 (three) times a week. Wash scalp 3 times weekly for 1 month, then once a week for maintenance, let sit 5 minutes before rinsing for Tinea Versicolor, Disp: 120 mL, Rfl: 11   lisinopril  (ZESTRIL ) 5 MG tablet, Take 1 tablet (5 mg total) by mouth daily. OFFICE VISIT NEEDED FOR ADDITIONAL REFILLS, Disp: 30 tablet, Rfl: 0   tadalafil  (CIALIS ) 5 MG tablet, Take 1 tablet (5 mg total) by mouth daily., Disp: 90 tablet, Rfl: 1   tirzepatide  (MOUNJARO ) 10 MG/0.5ML Pen, Inject 10 mg into the skin once a week., Disp: 6 mL, Rfl: 0  Allergies  Allergen Reactions   Omeprazole  Other (See Comments)    Reaction:  Memory loss    Metformin  And Related Diarrhea    I personally reviewed active problem list, medication list, allergies, family history with the patient/caregiver today.   ROS  Ten systems reviewed and is negative except as mentioned in HPI    Objective Physical Exam  CONSTITUTIONAL: Patient appears well-developed and well-nourished.  No distress. HEENT: Head atraumatic, normocephalic, neck supple. CARDIOVASCULAR: Normal rate, regular rhythm and normal heart sounds.  No murmur heard. No BLE edema. PULMONARY: Effort normal and breath sounds normal. No respiratory distress. ABDOMINAL: There is no tenderness or distention. MUSCULOSKELETAL: Normal  gait. Without gross motor or sensory deficit. PSYCHIATRIC: Patient has a normal mood and affect. behavior is normal. Judgment and thought content normal.  Vitals:   10/16/24 1000  BP: 128/76  Pulse: 84  Resp: 16  SpO2: 96%  Weight: 212 lb 14.4 oz (96.6 kg)  Height: 5' 9 (1.753 m)    Body mass index is 31.44 kg/m.  Recent Results (from the past 2160 hours)  POCT glycosylated hemoglobin (Hb A1C)     Status: Abnormal   Collection Time: 10/16/24 10:06 AM  Result Value Ref Range   Hemoglobin A1C 6.0 (A) 4.0 - 5.6 %   HbA1c POC (<> result, manual entry)     HbA1c, POC (prediabetic range)     HbA1c, POC (controlled diabetic range)       PHQ2/9:    10/16/2024    9:50 AM 07/13/2024    2:36 PM 06/05/2024    8:03 AM  04/09/2024    2:17 PM 01/11/2024   12:57 PM  Depression screen PHQ 2/9  Decreased Interest 0 0 0 0 0  Down, Depressed, Hopeless 0 0 0 0 0  PHQ - 2 Score 0 0 0 0 0  Altered sleeping     0  Tired, decreased energy     0  Change in appetite     0  Feeling bad or failure about yourself      0  Trouble concentrating     0  Moving slowly or fidgety/restless     0  Suicidal thoughts     0  PHQ-9 Score     0   Difficult doing work/chores     Not difficult at all     Data saved with a previous flowsheet row definition    phq 9 is negative  Fall Risk:    10/16/2024    9:50 AM 07/13/2024    2:36 PM 06/05/2024    8:03 AM 04/09/2024    2:17 PM 01/11/2024   12:57 PM  Fall Risk   Falls in the past year? 0 0 0 0 0  Number falls in past yr: 0 0 0 0 0  Injury with Fall? 0 0 0 0 0  Risk for fall due to : No Fall Risks No Fall Risks No Fall Risks No Fall Risks No Fall Risks  Follow up Falls evaluation completed Falls evaluation completed Falls evaluation completed Falls prevention discussed;Education provided;Falls evaluation completed Falls prevention discussed;Education provided;Falls evaluation completed    Assessment & Plan Type 2 diabetes mellitus Well-controlled with  A1c of 6.0. Stable weight at 212 lbs. Advised against increasing dose due to stable A1c and weight. - Continue Mounjaro  10 mg daily. - Continue using continuous glucose monitor. - Encouraged dietary modifications and regular exercise.  Obesity Weight stabilized at 212 lbs. Engaging in regular exercise and dietary modifications. Advised against increasing Mounjaro  dose due to stable weight and A1c. - Continue current exercise regimen and dietary modifications. - Monitor weight and A1c levels.  Gastroesophageal reflux disease (GERD) Well-controlled with current medication. No symptoms reported. - Continue current GERD medication regimen.  Allergic rhinitis Managed with Flonase . No new symptoms. - Continue Flonase  daily.  Male erectile dysfunction Good response to Cialis  5 mg daily. Obtaining through [facility]. - Continue Cialis  5 mg daily. - Ensure 90-day supply through [facility].  Seborrheic dermatitis with tinea versicolor (scalp) Treated with ketoconazole  shampoo. No new symptoms. - Continue ketoconazole  shampoo as prescribed.  General Health Maintenance Discussed shingles vaccine and hepatitis B screening, not required currently. Advised healthy lifestyle. - Continue healthy lifestyle with regular exercise and dietary modifications. - Consider shingles vaccine and hepatitis B screening in the future.

## 2024-11-05 ENCOUNTER — Other Ambulatory Visit: Payer: Self-pay

## 2024-11-05 ENCOUNTER — Ambulatory Visit: Admitting: Family Medicine

## 2024-11-05 ENCOUNTER — Encounter: Payer: Self-pay | Admitting: Family Medicine

## 2024-11-05 VITALS — BP 128/78 | HR 87 | Resp 16 | Ht 69.0 in | Wt 221.2 lb

## 2024-11-05 DIAGNOSIS — G8929 Other chronic pain: Secondary | ICD-10-CM

## 2024-11-05 DIAGNOSIS — M25551 Pain in right hip: Secondary | ICD-10-CM | POA: Diagnosis not present

## 2024-11-05 MED ORDER — CELECOXIB 100 MG PO CAPS
100.0000 mg | ORAL_CAPSULE | Freq: Two times a day (BID) | ORAL | 0 refills | Status: AC
  Filled 2024-11-05: qty 60, 30d supply, fill #0
  Filled 2024-12-17: qty 60, 30d supply, fill #1

## 2024-11-05 NOTE — Patient Instructions (Signed)
 Hip Bursitis Rehab Ask your health care provider which exercises are safe for you. Do exercises exactly as told by your health care provider and adjust them as directed. It is normal to feel mild stretching, pulling, tightness, or discomfort as you do these exercises. Stop right away if you feel sudden pain or your pain gets worse. Do not begin these exercises until told by your health care provider. Stretching exercise This exercise warms up your muscles and joints and improves the movement and flexibility of your hip. This exercise also helps to relieve pain and stiffness. Iliotibial band stretch An iliotibial band is a strong band of muscle tissue that runs from the outer side of your hip to the outer side of your thigh and knee. Lie on your side with your left / right leg in the top position. Bend your left / right knee and grab your ankle. Stretch out your bottom arm to help you balance. Slowly bring your knee back so your thigh is slightly behind your body. Slowly lower your knee toward the floor until you feel a gentle stretch on the outside of your left / right thigh. If you do not feel a stretch and your knee will not lower more toward the floor, place the heel of your other foot on top of your knee and pull your knee down toward the floor with your foot. Hold this position for __________ seconds. Slowly return to the starting position. Repeat __________ times. Complete this exercise __________ times a day. Strengthening exercises These exercises build strength and endurance in your hip and pelvis. Endurance is the ability to use your muscles for a long time, even after they get tired. Bridge This exercise strengthens the muscles that move your thigh backward (hip extensors). Lie on your back on a firm surface with your knees bent and your feet flat on the floor. Tighten your buttocks muscles and lift your buttocks off the floor until your trunk is level with your thighs. Do not arch your  back. You should feel the muscles working in your buttocks and the back of your thighs. If you do not feel these muscles, slide your feet 1-2 inches (2.5-5 cm) farther away from your buttocks. If this exercise is too easy, try doing it with your arms crossed over your chest. Hold this position for __________ seconds. Slowly lower your hips to the starting position. Let your muscles relax completely after each repetition. Repeat __________ times. Complete this exercise __________ times a day. Squats This exercise strengthens the muscles in front of your thigh and knee (quadriceps). Stand in front of a table, with your feet and knees pointing straight ahead. You may rest your hands on the table for balance but not for support. Slowly bend your knees and lower your hips like you are going to sit in a chair. Keep your weight over your heels, not over your toes. Keep your lower legs upright so they are parallel with the table legs. Do not let your hips go lower than your knees. Do not bend lower than told by your health care provider. If your hip pain increases, do not bend as low. Hold the squat position for __________ seconds. Slowly push with your legs to return to standing. Do not use your hands to pull yourself to standing. Repeat __________ times. Complete this exercise __________ times a day. Hip hike  Stand sideways on a bottom step. Stand on your left / right leg with your other foot unsupported next to  the step. You can hold on to the railing or wall for balance if needed. Keep your knees straight and your torso square. Then lift your left / right hip up toward the ceiling. Hold this position for __________ seconds. Slowly let your left / right hip lower toward the floor, past the starting position. Your foot should get closer to the floor. Do not lean or bend your knees. Repeat __________ times. Complete this exercise __________ times a day. Single leg stand This exercise increases  your balance. Without shoes, stand near a railing or in a doorway. You may hold on to the railing or door frame as needed for balance. Squeeze your left / right buttock muscles, then lift up your other foot. Do not let your left / right hip push out to the side. It is helpful to stand in front of a mirror for this exercise so you can watch your hip. Hold this position for __________ seconds. Repeat __________ times. Complete this exercise __________ times a day. This information is not intended to replace advice given to you by your health care provider. Make sure you discuss any questions you have with your health care provider. Document Revised: 10/28/2021 Document Reviewed: 10/28/2021 Elsevier Patient Education  2024 [employer].

## 2024-11-05 NOTE — Progress Notes (Signed)
 Name: Wayne Flowers   MRN: [id number]    DOB: 08/15/1976   Date:11/05/2024       Progress Note  Subjective  Chief Complaint  Chief Complaint  Patient presents with   Hip Pain    R hip, x3 days unable to rest, on going for years but lately been triggered more   Discussed the use of AI scribe software for clinical note transcription with the patient, who gave verbal consent to proceed.  History of Present Illness Wayne Flowers is a 48 year old male who presents with worsening hip pain.  He has experienced hip pain for approximately 15 years, initially described as a dull pain that would come and go. An evaluation many years ago suggested early signs of arthritis on an x-ray. Recently, the pain has intensified, becoming sharper and more persistent, particularly at night, significantly disrupting his sleep over the past weekend.  The pain is localized to the outer aspect of his hip, not involving the groin or buttocks, and does not radiate from his back. It is not exacerbated by touch but is more of a deeper sensation.  He has a family history of hip issues, with both his father and younger brother having undergone hip replacement surgery. He has previously tried ibuprofen for pain relief, but it was ineffective.   In 2022, a CT scan for renal stones incidentally noted mild thoracolumbar spondylosis but did not comment on his hip bones. He has not had recent imaging specifically for his hip pain.    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   Low libido 08/22/2013    Past Surgical History:  Procedure Laterality Date   COLONOSCOPY WITH PROPOFOL  N/A 04/21/2020   Procedure: COLONOSCOPY WITH PROPOFOL ;  Surgeon: Jinny Carmine, MD;  Location: [facility];  Service: Endoscopy;  Laterality: N/A;   CYSTOSCOPY W/ RETROGRADES Left 09/16/2015   Procedure: CYSTOSCOPY WITH RETROGRADE PYELOGRAM;  Surgeon: Ozell JONELLE Burkes, MD;  Location: [facility];  Service: Urology;  Laterality: Left;   CYSTOSCOPY WITH URETEROSCOPY AND STENT PLACEMENT     EXTRACORPOREAL SHOCK WAVE LITHOTRIPSY Left 03/26/2021   Procedure: EXTRACORPOREAL SHOCK WAVE LITHOTRIPSY (ESWL);  Surgeon: Burkes Ozell JONELLE, MD;  Location: [facility];  Service: Urology;  Laterality: Left;   POLYPECTOMY  04/21/2020   Procedure: POLYPECTOMY;  Surgeon: Jinny Carmine, MD;  Location: [facility];  Service: Endoscopy;;   URETEROSCOPY WITH HOLMIUM LASER LITHOTRIPSY Left 09/16/2015   Procedure: URETEROSCOPY WITH HOLMIUM LASER LITHOTRIPSY;  Surgeon: Ozell JONELLE Burkes, MD;  Location: [facility];  Service: Urology;  Laterality: Left;   VASECTOMY      Family History  Problem Relation Age of Onset   Diabetes Mother    Cancer Father        renal   Spina bifida Sister    Appendicitis Son    Cancer Maternal Grandfather        black lung   Cancer Paternal Grandmother        lung  Colon cancer Paternal Grandmother    Diabetes Sister     Social History   Tobacco Use   Smoking status: Never   Smokeless tobacco: Never  Substance Use Topics   Alcohol use: Yes    Alcohol/week: 0.0 - 1.0 standard drinks of alcohol    Comment: rarley     Current Outpatient Medications:    atorvastatin  (LIPITOR) 40 MG tablet, Take 1 tablet (40 mg total) by mouth at bedtime., Disp: 90 tablet, Rfl: 1   Continuous Glucose Sensor (FREESTYLE LIBRE 3 PLUS SENSOR) MISC, Change sensor every 15 days., Disp: 6 each, Rfl: 1   famotidine  (PEPCID )  20 MG tablet, Take 1 tablet (20 mg total) by mouth 2 (two) times daily as needed., Disp: 180 tablet, Rfl: 1   fluticasone  (FLONASE ) 50 MCG/ACT nasal spray, Place 2 sprays into both nostrils daily., Disp: 48 g, Rfl: 1   ketoconazole  (NIZORAL ) 2 % shampoo, Apply 1 Application topically 3 (three) times a week. Wash scalp 3 times weekly for 1 month, then once a week for maintenance, let sit 5 minutes before rinsing for Tinea Versicolor, Disp: 120 mL, Rfl: 11   lisinopril  (ZESTRIL ) 5 MG tablet, Take 1 tablet (5 mg total) by mouth daily. OFFICE VISIT NEEDED FOR ADDITIONAL REFILLS, Disp: 30 tablet, Rfl: 0   lisinopril  (ZESTRIL ) 5 MG tablet, Take 1 tablet (5 mg total) by mouth daily., Disp: 90 tablet, Rfl: 0   tadalafil  (CIALIS ) 5 MG tablet, Take 1 tablet (5 mg total) by mouth daily., Disp: 90 tablet, Rfl: 1   tirzepatide  (MOUNJARO ) 10 MG/0.5ML Pen, Inject 10 mg into the skin once a week., Disp: 6 mL, Rfl: 0  Allergies  Allergen Reactions   Omeprazole  Other (See Comments)    Reaction:  Memory loss    Metformin  And Related Diarrhea    I personally reviewed active problem list, medication list, allergies, family history with the patient/caregiver today.   ROS  Ten systems reviewed and is negative except as mentioned in HPI    Objective Physical Exam CONSTITUTIONAL: Patient appears well-developed and well-nourished. No distress. HEENT: Head atraumatic, normocephalic, neck supple. CARDIOVASCULAR: Normal rate, regular rhythm and normal heart sounds. No murmur heard. No BLE edema. PULMONARY: Effort normal and breath sounds normal. No respiratory distress. ABDOMINAL: There is no tenderness or distention. MUSCULOSKELETAL: Normal gait. Without gross motor or sensory deficit. Decreased internal rotation of hip with pain. No tenderness on palpation of hip. Pain on extension and right lateral bending of spine. No pain on forward flexion of spine. Pain when crossing right leg over left side. Negative  straight leg raise PSYCHIATRIC: Patient has a normal mood and affect. Behavior is normal. Judgment and thought content normal.  Vitals:   11/05/24 1046  BP: 128/78  Pulse: 87  Resp: 16  SpO2: 97%  Weight: 221 lb 3.2 oz (100.3 kg)  Height: 5' 9 (1.753 m)    Body mass index is 32.67 kg/m.  Recent Results (from the past 2160 hours)  POCT glycosylated hemoglobin (Hb A1C)     Status: Abnormal   Collection Time: 10/16/24 10:06 AM  Result Value Ref Range   Hemoglobin A1C 6.0 (A) 4.0 - 5.6 %   HbA1c POC (<> result, manual entry)     HbA1c, POC (prediabetic range)     HbA1c, POC (controlled diabetic range)      Diabetic Foot Exam:     PHQ2/9:    11/05/2024   10:46 AM 10/16/2024    9:50 AM 07/13/2024  2:36 PM 06/05/2024    8:03 AM 04/09/2024    2:17 PM  Depression screen PHQ 2/9  Decreased Interest 0 0 0 0 0  Down, Depressed, Hopeless 0 0 0 0 0  PHQ - 2 Score 0 0 0 0 0    phq 9 is negative  Fall Risk:    11/05/2024   10:46 AM 10/16/2024    9:50 AM 07/13/2024    2:36 PM 06/05/2024    8:03 AM 04/09/2024    2:17 PM  Fall Risk   Falls in the past year? 0 0 0 0 0  Number falls in past yr: 0 0 0 0 0  Injury with Fall? 0 0  0  0  0   Risk for fall due to : No Fall Risks No Fall Risks No Fall Risks No Fall Risks No Fall Risks  Follow up Falls evaluation completed Falls evaluation completed Falls evaluation completed Falls evaluation completed Falls prevention discussed;Education provided;Falls evaluation completed     Data saved with a previous flowsheet row definition      Assessment & Plan Right hip pain Chronic right hip pain, differential diagnosis includes  radiculitis from spondylosis of spine or IT band syndrome. We will try NSAID's first since he has DM and prednisone will likely affect glucose levels  - Prescribed Celebrex : twice daily for five days, then once daily, as needed. - Provided IT band stretching and strengthening exercises. - Recommended ice  application. - Discussed chiropractic care or physical therapy options. - Advised against concurrent ibuprofen use with Celebrex . - Provided x-ray information considering insurance and cost.

## 2024-11-10 ENCOUNTER — Other Ambulatory Visit: Payer: Self-pay

## 2024-11-14 LAB — OPHTHALMOLOGY REPORT-SCANNED

## 2024-11-30 ENCOUNTER — Other Ambulatory Visit: Payer: Self-pay

## 2024-12-01 ENCOUNTER — Other Ambulatory Visit: Payer: Self-pay

## 2024-12-03 ENCOUNTER — Other Ambulatory Visit: Payer: Self-pay

## 2025-01-01 ENCOUNTER — Other Ambulatory Visit: Payer: Self-pay

## 2025-01-02 ENCOUNTER — Other Ambulatory Visit: Payer: Self-pay

## 2025-01-16 ENCOUNTER — Encounter: Admitting: Family Medicine

## 2025-08-07 ENCOUNTER — Ambulatory Visit: Admitting: Dermatology
